# Patient Record
Sex: Male | Born: 1950
Health system: Southern US, Community
[De-identification: ages and names within clinical notes are randomized; demographics above are authoritative.]

## PROBLEM LIST (undated history)

## (undated) DIAGNOSIS — M545 Low back pain, unspecified: Secondary | ICD-10-CM

## (undated) DIAGNOSIS — E785 Hyperlipidemia, unspecified: Secondary | ICD-10-CM

## (undated) DIAGNOSIS — K219 Gastro-esophageal reflux disease without esophagitis: Secondary | ICD-10-CM

## (undated) DIAGNOSIS — Z79899 Other long term (current) drug therapy: Secondary | ICD-10-CM

## (undated) HISTORY — DX: Low back pain, unspecified: M54.50

## (undated) HISTORY — DX: Gastro-esophageal reflux disease without esophagitis: K21.9

## (undated) HISTORY — DX: Low back pain: M54.5

## (undated) HISTORY — PX: APPENDECTOMY: SHX54

## (undated) HISTORY — DX: Other long term (current) drug therapy: Z79.899

## (undated) HISTORY — DX: Hyperlipidemia, unspecified: E78.5

---

## 2011-02-24 ENCOUNTER — Encounter: Payer: Self-pay | Admitting: Surgery

## 2011-03-09 ENCOUNTER — Encounter: Payer: Self-pay | Admitting: Surgery

## 2011-04-09 ENCOUNTER — Encounter: Payer: Self-pay | Admitting: Surgery

## 2013-10-27 LAB — PSA

## 2014-11-27 ENCOUNTER — Telehealth: Payer: Self-pay | Admitting: Unknown Physician Specialty

## 2014-11-27 MED ORDER — OMEPRAZOLE 20 MG PO CPDR: 20.0000 mg | DELAYED_RELEASE_CAPSULE | Freq: Every day | ORAL | Status: DC

## 2014-11-27 NOTE — Telephone Encounter (Signed)
Routing to provider. Patient's practice partner number is [phone].

## 2014-11-27 NOTE — Telephone Encounter (Signed)
E-Fax came through for refill: Rx: Omeprazole Copy of Rx in basket

## 2015-02-07 DIAGNOSIS — M545 Low back pain, unspecified: Secondary | ICD-10-CM | POA: Insufficient documentation

## 2015-02-07 DIAGNOSIS — K219 Gastro-esophageal reflux disease without esophagitis: Secondary | ICD-10-CM | POA: Insufficient documentation

## 2015-02-07 DIAGNOSIS — E1169 Type 2 diabetes mellitus with other specified complication: Secondary | ICD-10-CM | POA: Insufficient documentation

## 2015-02-07 DIAGNOSIS — E785 Hyperlipidemia, unspecified: Secondary | ICD-10-CM

## 2015-02-07 DIAGNOSIS — G4700 Insomnia, unspecified: Secondary | ICD-10-CM | POA: Insufficient documentation

## 2015-02-07 DIAGNOSIS — Z79899 Other long term (current) drug therapy: Secondary | ICD-10-CM | POA: Insufficient documentation

## 2015-02-08 ENCOUNTER — Ambulatory Visit (INDEPENDENT_AMBULATORY_CARE_PROVIDER_SITE_OTHER): Payer: Worker's Compensation | Admitting: Unknown Physician Specialty

## 2015-02-08 ENCOUNTER — Encounter: Payer: Self-pay | Admitting: Unknown Physician Specialty

## 2015-02-08 VITALS — BP 135/75 | HR 67 | Temp 98.5°F | Ht 66.6 in | Wt 193.0 lb

## 2015-02-08 DIAGNOSIS — M545 Low back pain: Secondary | ICD-10-CM

## 2015-02-08 DIAGNOSIS — G4700 Insomnia, unspecified: Secondary | ICD-10-CM | POA: Diagnosis not present

## 2015-02-08 LAB — UA/M W/RFLX CULTURE, ROUTINE
BILIRUBIN UA: NEGATIVE
Glucose, UA: NEGATIVE
KETONES UA: NEGATIVE
Leukocytes, UA: NEGATIVE
Nitrite, UA: NEGATIVE
Protein, UA: NEGATIVE
RBC, UA: NEGATIVE
SPEC GRAV UA: 1.025 (ref 1.005–1.030)
UUROB: 0.2 mg/dL (ref 0.2–1.0)
pH, UA: 6.5 (ref 5.0–7.5)

## 2015-02-08 MED ORDER — TRAMADOL HCL 50 MG PO TABS: 50.0000 mg | ORAL_TABLET | Freq: Two times a day (BID) | ORAL | Status: DC | PRN

## 2015-02-08 NOTE — Assessment & Plan Note (Signed)
Restart Tramadol 1/2 BID with Tylenol

## 2015-02-08 NOTE — Assessment & Plan Note (Signed)
Restart Amitriptyline

## 2015-02-08 NOTE — Progress Notes (Signed)
   BP 135/75 mmHg  Pulse 67  Temp(Src) 98.5 F (36.9 C)  Ht 5' 6.6" (1.692 m)  Wt 193 lb (87.544 kg)  BMI 30.58 kg/m2  SpO2 95%   Subjective:    Patient ID: Dale Morton, male    DOB: Feb 05, 1951, 64 y.o.   MRN: [id number]  HPI: Dale Morton is a 64 y.o. male   Chronic pain Pt states is insurance won't pay for Ultracet.  [facility] states they won't pay for that anymore.  Not taking Ultram.  States that he only took the Amitriptylline once or twice even though last night he couldn't sleep.  No urine for a period of time and needs to be rechecked.    Hypercholesterol Noted high cholesterol about 1 year ago.  Will recheck in again.     Relevant past medical, surgical, family and social history reviewed and updated as indicated. Interim medical history since our last visit reviewed. Allergies and medications reviewed and updated.  Review of Systems  Per HPI unless specifically indicated above     Objective:    BP 135/75 mmHg  Pulse 67  Temp(Src) 98.5 F (36.9 C)  Ht 5' 6.6" (1.692 m)  Wt 193 lb (87.544 kg)  BMI 30.58 kg/m2  SpO2 95%  Wt Readings from Last 3 Encounters:  02/08/15 193 lb (87.544 kg)  08/31/14 193 lb (87.544 kg)    Physical Exam  Constitutional: He is oriented to person, place, and time. He appears well-developed and well-nourished. No distress.  HENT:  Head: Normocephalic and atraumatic.  Eyes: Conjunctivae and lids are normal. Right eye exhibits no discharge. Left eye exhibits no discharge. No scleral icterus.  Cardiovascular: Normal rate, regular rhythm and normal heart sounds.   Pulmonary/Chest: Effort normal and breath sounds normal. No respiratory distress.  Abdominal: Normal appearance. He exhibits no distension. There is no splenomegaly or hepatomegaly. There is no tenderness.  Musculoskeletal: Normal range of motion.  Neurological: He is alert and oriented to person, place, and time.  Skin: Skin is intact. No rash noted. No pallor.  Psychiatric: He  has a normal mood and affect. His behavior is normal. Judgment and thought content normal.     Assessment & Plan:   Problem List Items Addressed This Visit      Unprioritized   Low back pain    Restart Tramadol 1/2 BID with Tylenol      Relevant Medications   traMADol (ULTRAM) 50 MG tablet   Other Relevant Orders   Lipid Panel w/o Chol/HDL Ratio   UA/M w/rflx Culture, Routine   Comprehensive metabolic panel   Insomnia - Primary    Restart Amitriptyline          Follow up plan: Return in about 3 months (around 05/10/2015) for physical.

## 2015-02-08 NOTE — Patient Instructions (Signed)
Take Tramadol 50 mg 1/2 everyday with 650 mg Tylenol BID Take Amitriptyline  1-2 at night for both pain and sleep

## 2015-02-09 LAB — COMPREHENSIVE METABOLIC PANEL
A/G RATIO: 1.7 (ref 1.1–2.5)
ALK PHOS: 119 IU/L — AB (ref 39–117)
ALT: 28 IU/L (ref 0–44)
AST: 29 IU/L (ref 0–40)
Albumin: 4.8 g/dL (ref 3.6–4.8)
BILIRUBIN TOTAL: 0.6 mg/dL (ref 0.0–1.2)
BUN/Creatinine Ratio: 14 (ref 10–22)
BUN: 11 mg/dL (ref 8–27)
CHLORIDE: 99 mmol/L (ref 97–108)
CO2: 26 mmol/L (ref 18–29)
Calcium: 9.4 mg/dL (ref 8.6–10.2)
Creatinine, Ser: 0.76 mg/dL (ref 0.76–1.27)
GFR calc Af Amer: 113 mL/min/{1.73_m2} (ref >59)
GFR calc non Af Amer: 98 mL/min/{1.73_m2} (ref >59)
GLOBULIN, TOTAL: 2.8 g/dL (ref 1.5–4.5)
Glucose: 91 mg/dL (ref 65–99)
Potassium: 4 mmol/L (ref 3.5–5.2)
SODIUM: 141 mmol/L (ref 134–144)
Total Protein: 7.6 g/dL (ref 6.0–8.5)

## 2015-02-09 LAB — LIPID PANEL W/O CHOL/HDL RATIO
CHOLESTEROL TOTAL: 261 mg/dL — AB (ref 100–199)
HDL: 38 mg/dL — AB (ref >39)
LDL Calculated: 166 mg/dL — ABNORMAL HIGH (ref 0–99)
Triglycerides: 283 mg/dL — ABNORMAL HIGH (ref 0–149)
VLDL CHOLESTEROL CAL: 57 mg/dL — AB (ref 5–40)

## 2015-02-13 ENCOUNTER — Encounter: Payer: Self-pay | Admitting: Unknown Physician Specialty

## 2015-05-10 ENCOUNTER — Encounter: Payer: PRIVATE HEALTH INSURANCE | Admitting: Unknown Physician Specialty

## 2015-05-31 ENCOUNTER — Ambulatory Visit (INDEPENDENT_AMBULATORY_CARE_PROVIDER_SITE_OTHER): Payer: PRIVATE HEALTH INSURANCE | Admitting: Family Medicine

## 2015-05-31 ENCOUNTER — Ambulatory Visit
Admission: RE | Admit: 2015-05-31 | Discharge: 2015-05-31 | Disposition: A | Discharge status: Home / Self Care | Payer: Self-pay | Source: Ambulatory Visit | Attending: Family Medicine | Admitting: Family Medicine

## 2015-05-31 ENCOUNTER — Encounter: Payer: Self-pay | Admitting: Family Medicine

## 2015-05-31 VITALS — BP 124/78 | HR 68 | Temp 97.9°F | Ht 66.3 in | Wt 196.0 lb

## 2015-05-31 DIAGNOSIS — M545 Low back pain: Secondary | ICD-10-CM

## 2015-05-31 DIAGNOSIS — R748 Abnormal levels of other serum enzymes: Secondary | ICD-10-CM | POA: Diagnosis not present

## 2015-05-31 LAB — URINALYSIS, ROUTINE W REFLEX MICROSCOPIC
BILIRUBIN UA: NEGATIVE
Ketones, UA: NEGATIVE
Leukocytes, UA: NEGATIVE
Nitrite, UA: NEGATIVE
Protein, UA: NEGATIVE
RBC, UA: NEGATIVE
Specific Gravity, UA: 1.025 (ref 1.005–1.030)
UUROB: 0.2 mg/dL (ref 0.2–1.0)
pH, UA: 5.5 (ref 5.0–7.5)

## 2015-05-31 MED ORDER — MELOXICAM 15 MG PO TABS: 15.0000 mg | ORAL_TABLET | Freq: Every day | ORAL | Status: DC

## 2015-05-31 NOTE — Assessment & Plan Note (Signed)
Discuss low back pain care and treatment proper lifting technique We'll get x-ray of back Use meloxicam

## 2015-05-31 NOTE — Assessment & Plan Note (Signed)
Elevated alkaline phosphatase last labs will check again to see if any change especially with chronic back pain.

## 2015-05-31 NOTE — Progress Notes (Signed)
BP 124/78 mmHg  Pulse 68  Temp(Src) 97.9 F (36.6 C)  Ht 5' 6.3" (1.684 m)  Wt 196 lb (88.905 kg)  BMI 31.35 kg/m2  SpO2 96%   Subjective:    Patient ID: DENALI BECVAR, male    DOB: Nov 29, 1950, 64 y.o.   MRN: [id number]  HPI: SAMIER JACO is a 64 y.o. male  Chief Complaint  Patient presents with  . Back Pain    low back up to neck   patient accompanied by interpreter  patient with intermittent chronic back pain is been ongoing long-term but worse over the last 3 weeks No blood in stool or urine patient does say lifting job with [employer] No radicular symptoms more in his right sacroiliac joint area Relevant past medical, surgical, family and social history reviewed and updated as indicated. Interim medical history since our last visit reviewed. Allergies and medications reviewed and updated.  Review of Systems  Constitutional: Negative.   Respiratory: Negative.   Cardiovascular: Negative.   Gastrointestinal: Negative.   Genitourinary: Negative.   Musculoskeletal: Negative for myalgias, joint swelling, arthralgias and gait problem.    Per HPI unless specifically indicated above     Objective:    BP 124/78 mmHg  Pulse 68  Temp(Src) 97.9 F (36.6 C)  Ht 5' 6.3" (1.684 m)  Wt 196 lb (88.905 kg)  BMI 31.35 kg/m2  SpO2 96%  Wt Readings from Last 3 Encounters:  05/31/15 196 lb (88.905 kg)  02/08/15 193 lb (87.544 kg)  08/31/14 193 lb (87.544 kg)    Physical Exam  Constitutional: He is oriented to person, place, and time. He appears well-developed and well-nourished. No distress.  HENT:  Head: Normocephalic and atraumatic.  Right Ear: Hearing normal.  Left Ear: Hearing normal.  Nose: Nose normal.  Eyes: Conjunctivae and lids are normal. Right eye exhibits no discharge. Left eye exhibits no discharge. No scleral icterus.  Cardiovascular: Normal rate, regular rhythm and normal heart sounds.   Pulmonary/Chest: Effort normal and breath sounds normal. No  respiratory distress.  Musculoskeletal: Normal range of motion.  Straight leg raising normal reflexes normal no neurological deficit good strength and range of motion  Neurological: He is alert and oriented to person, place, and time.  Skin: Skin is intact. No rash noted.  Psychiatric: He has a normal mood and affect. His speech is normal and behavior is normal. Judgment and thought content normal. Cognition and memory are normal.    Results for orders placed or performed in visit on 02/08/15  Lipid Panel w/o Chol/HDL Ratio  Result Value Ref Range   Cholesterol, Total 261 (H) 100 - 199 mg/dL   Triglycerides 409 (H) 0 - 149 mg/dL   HDL 38 (L) >81 mg/dL   VLDL Cholesterol Cal 57 (H) 5 - 40 mg/dL   LDL Calculated 191 (H) 0 - 99 mg/dL  UA/M w/rflx Culture, Routine  Result Value Ref Range   Specific Gravity, UA 1.025 1.005 - 1.030   pH, UA 6.5 5.0 - 7.5   Color, UA Yellow Yellow   Appearance Ur Clear Clear   Leukocytes, UA Negative Negative   Protein, UA Negative Negative/Trace   Glucose, UA Negative Negative   Ketones, UA Negative Negative   RBC, UA Negative Negative   Bilirubin, UA Negative Negative   Urobilinogen, Ur 0.2 0.2 - 1.0 mg/dL   Nitrite, UA Negative Negative  Comprehensive metabolic panel  Result Value Ref Range   Glucose 91 65 - 99 mg/dL  BUN 11 8 - 27 mg/dL   Creatinine, Ser 4.090.76 0.76 - 1.27 mg/dL   GFR calc non Af Amer 98 >59 mL/min/1.73   GFR calc Af Amer 113 >59 mL/min/1.73   BUN/Creatinine Ratio 14 10 - 22   Sodium 141 134 - 144 mmol/L   Potassium 4.0 3.5 - 5.2 mmol/L   Chloride 99 97 - 108 mmol/L   CO2 26 18 - 29 mmol/L   Calcium 9.4 8.6 - 10.2 mg/dL   Total Protein 7.6 6.0 - 8.5 g/dL   Albumin 4.8 3.6 - 4.8 g/dL   Globulin, Total 2.8 1.5 - 4.5 g/dL   Albumin/Globulin Ratio 1.7 1.1 - 2.5   Bilirubin Total 0.6 0.0 - 1.2 mg/dL   Alkaline Phosphatase 119 (H) 39 - 117 IU/L   AST 29 0 - 40 IU/L   ALT 28 0 - 44 IU/L      Assessment & Plan:   Problem  List Items Addressed This Visit      Other   Low back pain - Primary    Discuss low back pain care and treatment proper lifting technique We'll get x-ray of back Use meloxicam      Relevant Medications   meloxicam (MOBIC) 15 MG tablet   Other Relevant Orders   Urinalysis, Routine w reflex microscopic (not at [facility])   DG Lumbar Spine 2-3 Views   Elevated liver enzymes    Elevated alkaline phosphatase last labs will check again to see if any change especially with chronic back pain.      Relevant Orders   Comprehensive metabolic panel       Follow up plan: Return if symptoms worsen or fail to improve, for Physical Exam.

## 2015-06-01 LAB — COMPREHENSIVE METABOLIC PANEL
A/G RATIO: 1.7 (ref 1.1–2.5)
ALK PHOS: 132 IU/L — AB (ref 39–117)
ALT: 31 IU/L (ref 0–44)
AST: 31 IU/L (ref 0–40)
Albumin: 4.6 g/dL (ref 3.6–4.8)
BUN / CREAT RATIO: 21 (ref 10–22)
BUN: 16 mg/dL (ref 8–27)
Bilirubin Total: 0.6 mg/dL (ref 0.0–1.2)
CO2: 21 mmol/L (ref 18–29)
Calcium: 9.5 mg/dL (ref 8.6–10.2)
Chloride: 97 mmol/L (ref 96–106)
Creatinine, Ser: 0.77 mg/dL (ref 0.76–1.27)
GFR calc Af Amer: 112 mL/min/{1.73_m2} (ref >59)
GFR calc non Af Amer: 97 mL/min/{1.73_m2} (ref >59)
GLOBULIN, TOTAL: 2.7 g/dL (ref 1.5–4.5)
Glucose: 192 mg/dL — ABNORMAL HIGH (ref 65–99)
Potassium: 3.9 mmol/L (ref 3.5–5.2)
SODIUM: 136 mmol/L (ref 134–144)
Total Protein: 7.3 g/dL (ref 6.0–8.5)

## 2015-06-04 ENCOUNTER — Telehealth: Payer: Self-pay | Admitting: Family Medicine

## 2015-06-04 DIAGNOSIS — R739 Hyperglycemia, unspecified: Secondary | ICD-10-CM | POA: Insufficient documentation

## 2015-06-04 NOTE — Telephone Encounter (Signed)
Please have him come in for A1c as his sugar was very elevated (does not speak English per notes)

## 2015-06-04 NOTE — Telephone Encounter (Signed)
Patient coming in 12/28 @ 9:00am for A1c

## 2015-06-05 ENCOUNTER — Other Ambulatory Visit: Payer: PRIVATE HEALTH INSURANCE

## 2015-06-05 DIAGNOSIS — R739 Hyperglycemia, unspecified: Secondary | ICD-10-CM

## 2015-06-06 LAB — HGB A1C W/O EAG: Hgb A1c MFr Bld: 6.4 % — ABNORMAL HIGH (ref 4.8–5.6)

## 2015-06-21 ENCOUNTER — Encounter: Payer: Self-pay | Admitting: Unknown Physician Specialty

## 2015-06-21 ENCOUNTER — Ambulatory Visit (INDEPENDENT_AMBULATORY_CARE_PROVIDER_SITE_OTHER): Payer: PRIVATE HEALTH INSURANCE | Admitting: Unknown Physician Specialty

## 2015-06-21 VITALS — BP 126/73 | HR 73 | Temp 98.8°F | Ht 65.6 in | Wt 196.8 lb

## 2015-06-21 DIAGNOSIS — E785 Hyperlipidemia, unspecified: Secondary | ICD-10-CM | POA: Diagnosis not present

## 2015-06-21 DIAGNOSIS — E119 Type 2 diabetes mellitus without complications: Secondary | ICD-10-CM | POA: Diagnosis not present

## 2015-06-21 MED ORDER — ATORVASTATIN CALCIUM 20 MG PO TABS: 20.0000 mg | ORAL_TABLET | Freq: Every day | ORAL | Status: DC

## 2015-06-21 NOTE — Assessment & Plan Note (Signed)
Discussed diet and exercise.  Written information given in spanish.  No medications at this time.

## 2015-06-21 NOTE — Patient Instructions (Addendum)
Diabetes y actividad fsica (Diabetes and Exercise) Hacer actividad fsica con regularidad es muy importante. No se trata solo de perder peso. Tiene muchos otros beneficios, como por ejemplo:  Mejorar el estado fsico, la flexibilidad y la resistencia.  Aumenta la densidad sea.  Ayuda a controlar el peso.  Disminuye la grasa corporal.  Aumenta la fuerza muscular.  Reduce el estrs y las tensiones.  Mejora el estado de salud general. Las personas diabticas que realizan actividad fsica tienen beneficios adicionales debido al ejercicio:  Reduce el apetito.  El organismo mejora el uso del azcar (glucosa) de la sangre.  Ayuda a disminuir o controlar la glucosa en la sangre.  Disminuye la presin arterial.  Ayuda a disminuir los lpidos en la sangre (colesterol y triglicridos).  El organismo mejora el uso de la insulina porque:  Aumenta la sensibilidad del organismo a la insulina.  Reduce las necesidades de insulina del organismo.  Disminuye el riesgo de enfermedad cardaca por la actividad fsica ya que  disminuye el colesterol y tambin los triglicridos.  Aumenta los niveles de colesterol bueno (como las lipoprotenas de alta densidad [HDL]) en el organismo.  Disminuye los niveles de glucosa en la sangre. SU PLAN DE ACTIVIDAD  Elija una actividad que disfrute y establezca objetivos realistas. Para ejercitarse sin riesgos, debe comenzar a practicar cualquier actividad fsica nueva lentamente y aumentar la intensidad del ejercicio de forma gradual con el tiempo. Su mdico o educador en diabetes podrn ayudarlo a crear un plan de actividades que lo beneficie. Las recomendaciones generales incluyen lo siguiente:  Alentar a los nios para que realicen al menos 60 minutos de actividad fsica cada da.  Estirarse y realizar ejercicios de entrenamiento de la fuerza, como yoga o levantamiento de pesas, por lo menos 2 veces por semana.  Realizar en total por lo menos 150  minutos de ejercicios de intensidad moderada cada semana, como caminar a paso ligero o hacer gimnasia acutica.  Hacer ejercicio fsico por lo menos 3 das por semana y no dejar pasar ms de 2 das seguidos sin ejercitarse.  Evitar los perodos largos de inactividad (90 minutos o ms tiempo). Cuando deba pasar mucho tiempo sentado, haga pausas frecuentes para caminar o estirarse. RECOMENDACIONES PARA REALIZAR EJERCICIOS CUANDO SE TIENE DIABETES TIPO 1 O TIPO 2   Controle la glucosa en la sangre antes de comenzar. Si el nivel de glucosa en la sangre es de ms de 240 mg/dl, controle las cetonas en la orina. No haga actividad fsica si hay cetonas.  Evite inyectarse insulina en las zonas del cuerpo que ejercitar. Por ejemplo, evite inyectarse insulina en:  Los brazos, si juega al tenis.  Las piernas, si corre.  Lleve un registro de:  Los alimentos que consume antes y despus de hacer el ejercicio.  Los momentos esperables de picos de accin de la insulina.  Los niveles de glucosa en la sangre antes y despus de hacer ejercicios.  El tipo y cantidad de actividad fsica que realiza.  Revise los registros con su mdico. El mdico lo ayudar a desarrollar pautas para ajustar la cantidad de alimento y las cantidades de insulina antes y despus de hacer ejercicios.  Si toma insulina o agentes hipoglucemiantes por va oral, observe si hay signos y sntomas de hipoglucemia. Entre los que se incluyen:  Mareos.  Temblores.  Sudoracin.  Escalofros.  Confusin.  Beba gran cantidad de agua mientras hace ejercicios para evitar la deshidratacin o los golpes de calor. Durante la actividad fsica se pierde   agua corporal que se debe reponer.  Comente con su mdico antes de comenzar un programa de actividad fsica para verificar que sea seguro para usted. Recuerde, cualquier actividad es mejor que ninguna.   Esta informacin no tiene como fin reemplazar el consejo del mdico. Asegrese de  hacerle al mdico cualquier pregunta que tenga.   Document Released: 06/14/2007 Document Revised: 10/09/2014 Elsevier Interactive Patient Education 2016 Elsevier Inc. La diabetes mellitus y los alimentos (Diabetes Mellitus and Food) Es importante que controle su nivel de azcar en la sangre (glucosa). El nivel de glucosa en sangre depende en gran medida de lo que usted come. Comer alimentos saludables en las cantidades adecuadas a lo largo del da, aproximadamente a la misma hora todos los das, lo ayudar a controlar su nivel de glucosa en sangre. Tambin puede ayudarlo a retrasar o evitar el empeoramiento de la diabetes mellitus. Comer de manera saludable incluso puede ayudarlo a mejorar el nivel de presin arterial y a alcanzar o mantener un peso saludable.  Entre las recomendaciones generales para alimentarse y cocinar los alimentos de forma saludable, se incluyen las siguientes:  Respetar las comidas principales y comer colaciones con regularidad. Evitar pasar largos perodos sin comer con el fin de perder peso.  Seguir una dieta que consista principalmente en alimentos de origen vegetal, como frutas, vegetales, frutos secos, legumbres y cereales integrales.  Utilizar mtodos de coccin a baja temperatura, como hornear, en lugar de mtodos de coccin a alta temperatura, como frer en abundante aceite. Trabaje con el nutricionista para aprender a usar la informacin nutricional de las etiquetas de los alimentos. CMO PUEDEN AFECTARME LOS ALIMENTOS? Carbohidratos Los carbohidratos afectan el nivel de glucosa en sangre ms que cualquier otro tipo de alimento. El nutricionista lo ayudar a determinar cuntos carbohidratos puede consumir en cada comida y ensearle a contarlos. El recuento de carbohidratos es importante para mantener la glucosa en sangre en un nivel saludable, en especial si utiliza insulina o toma determinados medicamentos para la diabetes mellitus. Alcohol El alcohol puede  provocar disminuciones sbitas de la glucosa en sangre (hipoglucemia), en especial si utiliza insulina o toma determinados medicamentos para la diabetes mellitus. La hipoglucemia es una afeccin que puede poner en peligro la vida. Los sntomas de la hipoglucemia (somnolencia, mareos y desorientacin) son similares a los sntomas de haber consumido mucho alcohol.  Si el mdico lo autoriza a beber alcohol, hgalo con moderacin y siga estas pautas:  Las mujeres no deben beber ms de un trago por da, y los hombres no deben beber ms de dos tragos por da. Un trago es igual a:  12 onzas (355 ml) de cerveza  5 onzas de vino (150 ml) de vino  1,5onzas (45ml) de bebidas espirituosas  No beba con el estmago vaco.  Mantngase hidratado. Beba agua, gaseosas dietticas o t helado sin azcar.  Las gaseosas comunes, los jugos y otros refrescos podran contener muchos carbohidratos y se deben contar. QU ALIMENTOS NO SE RECOMIENDAN? Cuando haga las elecciones de alimentos, es importante que recuerde que todos los alimentos son distintos. Algunos tienen menos nutrientes que otros por porcin, aunque podran tener la misma cantidad de caloras o carbohidratos. Es difcil darle al cuerpo lo que necesita cuando consume alimentos con menos nutrientes. Estos son algunos ejemplos de alimentos que debera evitar ya que contienen muchas caloras y carbohidratos, pero pocos nutrientes:  Grasas trans (la mayora de los alimentos procesados incluyen grasas trans en la etiqueta de Informacin nutricional).  Gaseosas comunes.  Jugos.    Caramelos.  Dulces, como tortas, pasteles, rosquillas y galletas.  Comidas fritas. QU ALIMENTOS PUEDO COMER? Consuma alimentos ricos en nutrientes, que nutrirn el cuerpo y lo mantendrn saludable. Los alimentos que debe comer tambin dependern de varios factores, como:  Las caloras que necesita.  Los medicamentos que toma.  Su peso.  El nivel de glucosa en  sangre.  El nivel de presin arterial.  El nivel de colesterol. Debe consumir una amplia variedad de alimentos, por ejemplo:  Protenas.  Cortes de carne magros.  Protenas con bajo contenido de grasas saturadas, como pescado, clara de huevo y frijoles. Evite las carnes procesadas.  Frutas y vegetales.  Frutas y vegetales que pueden ayudar a controlar los niveles sanguneos de glucosa, como manzanas, mangos y batatas.  Productos lcteos.  Elija productos lcteos sin grasa o con bajo contenido de grasa, como leche, yogur y queso.  Cereales, panes, pastas y arroz.  Elija cereales integrales, como panes multicereales, avena en grano y arroz integral. Estos alimentos pueden ayudar a controlar la presin arterial.  Grasas.  Alimentos que contengan grasas saludables, como frutos secos, aguacate, aceite de oliva, aceite de canola y pescado. TODOS LOS QUE PADECEN DIABETES MELLITUS TIENEN EL MISMO PLAN DE COMIDAS? Dado que todas las personas que padecen diabetes mellitus son distintas, no hay un solo plan de comidas que funcione para todos. Es muy importante que se rena con un nutricionista que lo ayudar a crear un plan de comidas adecuado para usted.   Esta informacin no tiene como fin reemplazar el consejo del mdico. Asegrese de hacerle al mdico cualquier pregunta que tenga.   Document Released: 09/01/2007 Document Revised: 06/15/2014 Elsevier Interactive Patient Education 2016 Elsevier Inc.  

## 2015-06-21 NOTE — Progress Notes (Signed)
   BP 126/73 mmHg  Pulse 73  Temp(Src) 98.8 F (37.1 C)  Ht 5' 5.6" (1.666 m)  Wt 196 lb 12.8 oz (89.268 kg)  BMI 32.16 kg/m2  SpO2 95%   Subjective:    Patient ID: Dale Morton, male    DOB: 1951-04-30, 65 y.o.   MRN: [id number]  HPI: Dale HopeJose E Stalvey is a 65 y.o. male  Chief Complaint  Patient presents with  . Hyperlipidemia  . Gastroesophageal Reflux   Pt is here with an interpreter to discuss elevated blood sugar and hgb A1C of 6.4 along with high cholesterol.  Apparently another physician recommended Advicor which was too expensive and stopped taking.    Relevant past medical, surgical, family and social history reviewed and updated as indicated. Interim medical history since our last visit reviewed. Allergies and medications reviewed and updated.  Review of Systems  Per HPI unless specifically indicated above     Objective:    BP 126/73 mmHg  Pulse 73  Temp(Src) 98.8 F (37.1 C)  Ht 5' 5.6" (1.666 m)  Wt 196 lb 12.8 oz (89.268 kg)  BMI 32.16 kg/m2  SpO2 95%  Wt Readings from Last 3 Encounters:  06/21/15 196 lb 12.8 oz (89.268 kg)  05/31/15 196 lb (88.905 kg)  02/08/15 193 lb (87.544 kg)    Physical Exam  Constitutional: He is oriented to person, place, and time. He appears well-developed and well-nourished. No distress.  HENT:  Head: Normocephalic and atraumatic.  Eyes: Conjunctivae and lids are normal. Right eye exhibits no discharge. Left eye exhibits no discharge. No scleral icterus.  Cardiovascular: Normal rate.   Pulmonary/Chest: Effort normal.  Abdominal: Normal appearance. There is no splenomegaly or hepatomegaly.  Musculoskeletal: Normal range of motion.  Neurological: He is alert and oriented to person, place, and time.  Skin: Skin is intact. No rash noted. No pallor.  Psychiatric: He has a normal mood and affect. His behavior is normal. Judgment and thought content normal.    Results for orders placed or performed in visit on 06/05/15  Hgb A1c  w/o eAG  Result Value Ref Range   Hgb A1c MFr Bld 6.4 (H) 4.8 - 5.6 %      Assessment & Plan:   Problem List Items Addressed This Visit      Unprioritized   Hyperlipidemia    Start atorvastatin 20 mg.      Relevant Medications   atorvastatin (LIPITOR) 20 MG tablet   Diabetes (HCC) - Primary    Discussed diet and exercise.  Written information given in spanish.  No medications at this time.        Relevant Medications   atorvastatin (LIPITOR) 20 MG tablet       Follow up plan: Return in about 3 months (around 09/19/2015).   Needs Lipid panel, Hgb A1C, Microalbumin, CMP

## 2015-06-21 NOTE — Assessment & Plan Note (Signed)
Start atorvastatin 20 mg.

## 2015-09-18 ENCOUNTER — Other Ambulatory Visit: Payer: Self-pay | Admitting: Family Medicine

## 2015-09-18 NOTE — Telephone Encounter (Signed)
CW pt

## 2015-09-18 NOTE — Telephone Encounter (Signed)
Apt pe 

## 2015-09-20 ENCOUNTER — Ambulatory Visit: Payer: Self-pay | Admitting: Unknown Physician Specialty

## 2015-12-02 ENCOUNTER — Other Ambulatory Visit: Payer: Self-pay | Admitting: Unknown Physician Specialty

## 2016-01-24 ENCOUNTER — Other Ambulatory Visit: Payer: Self-pay | Admitting: Family Medicine

## 2016-04-10 ENCOUNTER — Encounter: Payer: Self-pay | Admitting: Unknown Physician Specialty

## 2016-04-10 ENCOUNTER — Ambulatory Visit (INDEPENDENT_AMBULATORY_CARE_PROVIDER_SITE_OTHER): Payer: Worker's Compensation | Admitting: Unknown Physician Specialty

## 2016-04-10 VITALS — BP 105/66 | HR 66 | Temp 98.2°F | Ht 66.5 in | Wt 183.4 lb

## 2016-04-10 DIAGNOSIS — M545 Low back pain: Secondary | ICD-10-CM | POA: Diagnosis not present

## 2016-04-10 MED ORDER — CYCLOBENZAPRINE HCL 10 MG PO TABS: 10.0000 mg | ORAL_TABLET | Freq: Three times a day (TID) | ORAL | 0 refills | Status: DC | PRN

## 2016-04-10 NOTE — Progress Notes (Signed)
BP 105/66 (BP Location: Left Arm, Patient Position: Sitting, Cuff Size: Large)   Pulse 66   Temp 98.2 F (36.8 C)   Ht 5' 6.5" (1.689 m) Comment: pt had shoes on  Wt 183 lb 6.4 oz (83.2 kg) Comment: pt had shoes on  SpO2 97%   BMI 29.16 kg/m    Subjective:    Patient ID: Dale Morton, male    DOB: [birth year], 65 y.o.   MRN: [id number]  HPI: Dale Morton is a 65 y.o. male  Pt without interpreter.  Language line used for assistance.  Pt is lost to f/u for his cholesterol  Back Pain  This is a new problem. Episode onset: 1 week. The problem occurs intermittently. The problem is unchanged. The quality of the pain is described as aching. The pain does not radiate. The pain is moderate. The pain is worse during the day. The symptoms are aggravated by sitting and position. Pertinent negatives include no abdominal pain, bladder incontinence, bowel incontinence, chest pain, dysuria, headaches, paresthesias or weakness. He has tried NSAIDs for the symptoms. The treatment provided mild relief.    Relevant past medical, surgical, family and social history reviewed and updated as indicated. Interim medical history since our last visit reviewed. Allergies and medications reviewed and updated.  Review of Systems  Cardiovascular: Negative for chest pain.  Gastrointestinal: Negative for abdominal pain and bowel incontinence.  Genitourinary: Negative for bladder incontinence and dysuria.  Musculoskeletal: Positive for back pain.  Neurological: Negative for weakness, headaches and paresthesias.    Per HPI unless specifically indicated above     Objective:    BP 105/66 (BP Location: Left Arm, Patient Position: Sitting, Cuff Size: Large)   Pulse 66   Temp 98.2 F (36.8 C)   Ht 5' 6.5" (1.689 m) Comment: pt had shoes on  Wt 183 lb 6.4 oz (83.2 kg) Comment: pt had shoes on  SpO2 97%   BMI 29.16 kg/m   Wt Readings from Last 3 Encounters:  04/10/16 183 lb 6.4 oz (83.2 kg)  06/21/15 196  lb 12.8 oz (89.3 kg)  05/31/15 196 lb (88.9 kg)    Physical Exam  Constitutional: He is oriented to person, place, and time. He appears well-developed and well-nourished. No distress.  HENT:  Head: Normocephalic and atraumatic.  Eyes: Conjunctivae and lids are normal. Right eye exhibits no discharge. Left eye exhibits no discharge. No scleral icterus.  Neck: Normal range of motion. Neck supple. No JVD present. Carotid bruit is not present.  Cardiovascular: Normal rate, regular rhythm and normal heart sounds.   Pulmonary/Chest: Effort normal and breath sounds normal. No respiratory distress.  Abdominal: Normal appearance. There is no splenomegaly or hepatomegaly.  Musculoskeletal: Normal range of motion.  Neurological: He is alert and oriented to person, place, and time.  Skin: Skin is warm, dry and intact. No rash noted. No pallor.  Psychiatric: He has a normal mood and affect. His behavior is normal. Judgment and thought content normal.    Results for orders placed or performed in visit on 06/05/15  Hgb A1c w/o eAG  Result Value Ref Range   Hgb A1c MFr Bld 6.4 (H) 4.8 - 5.6 %      Assessment & Plan:   Problem List Items Addressed This Visit      Unprioritized   Lumbago - Primary   Relevant Medications   cyclobenzaprine (FLEXERIL) 10 MG tablet    Other Visit Diagnoses   None.  Follow up plan: Return for needs routine follow up appointment.

## 2016-04-10 NOTE — Patient Instructions (Addendum)
Ejercicios para la espalda (Back Exercises) Los siguientes ejercicios fortalecen los msculos que dan soporte a la espalda y, adems, ayudan a mantener la flexibilidad de la zona lumbar. Hacer estos ejercicios puede ser de ayuda para evitar el dolor de espalda o aliviar el dolor actual. Si tiene dolor o molestias en la espalda, intente hacer estos ejercicios 2 o 3veces por da, o como se lo haya indicado el mdico. Cuando el dolor desaparezca, hgalos una vez por da, pero haga ms repeticiones de cada ejercicio. Si no tiene dolor o molestias en la espalda, haga estos ejercicios una vez por da o como se lo haya indicado el mdico. EJERCICIOS Rodilla al pecho Repita estos pasos 3 o 5veces con cada pierna: 1. Acustese boca arriba sobre una cama dura o sobre el suelo con las piernas extendidas. 2. Lleve una rodilla al pecho. La otra pierna debe quedar extendida y en contacto con el suelo. 3. Mantenga la rodilla contra el pecho. Para lograrlo tmese la rodilla o el muslo. 4. Tire de la rodilla hasta sentir una elongacin suave en la parte baja de la espalda. 5. Mantenga la elongacin durante 10 a 30segundos. 6. Suelte y extienda la pierna lentamente. Inclinacin de la pelvis Repita estos pasos 5 o 10veces: 1. Acustese boca arriba sobre una cama dura o sobre el suelo con las piernas extendidas. 2. Flexione las rodillas de modo que apunten al techo y los pies queden apoyados en el suelo. 3. Contraiga los msculos de la parte baja del abdomen para empujar la zona lumbar contra el suelo. Con este movimiento se inclinar la pelvis de modo que el cccix apunte hacia el techo, en lugar de apuntar en direccin a los pies o al suelo. 4. Contraiga suavemente y respire con normalidad mientras mantiene esta posicin durante 5 a 10segundos. El perro y el gato Repita estos pasos hasta que la zona lumbar se vuelva ms flexible: 1. Apoye las palmas de las manos y las rodillas sobre una superficie firme. Las  manos deben estar alineadas con los hombros y las rodillas con las caderas. Puede colocarse almohadillas debajo de las rodillas para estar cmodo. 2. Deje caer la cabeza y baje el cccix en direccin al suelo de modo que la zona lumbar se arquee como el lomo de un gato asustado. 3. Mantenga esta posicin durante 5segundos. 4. Lentamente, levante la cabeza y eleve el cccix de modo que apunte en direccin al techo para que la espalda forme un arco hundido como el lomo de un perro contento. 5. Mantenga esta posicin durante 5segundos. Flexiones de brazos Repita estos pasos 5 o 10veces: 1. Acustese boca abajo en el suelo. 2. Coloque las palmas de las manos cerca de la cabeza, separadas aproximadamente al ancho de los hombros. 3. Con la espalda lo ms relajada posible y las caderas apoyadas en el suelo, extienda lentamente los brazos para levantar la mitad superior del cuerpo y elevar los hombros. No use los msculos de la espalda para elevar la parte superior del torso. Puede cambiar las manos de lugar para estar ms cmodo. 4. Mantenga esta posicin durante 5segundos mientras mantiene la espalda relajada. 5. Lentamente vuelva a la posicin horizontal. Puentes Repita estos pasos 10veces: 1. Acustese boca arriba sobre una superficie firme. 2. Flexione las rodillas de modo que apunten al techo y los pies queden apoyados en el suelo. 3. Contraiga los glteos y despegue las nalgas del suelo hasta que la cintura est casi a la misma altura que las rodillas. Debe   sentir el trabajo muscular en las nalgas y la parte posterior de los muslos. Si no siente el esfuerzo de BorgWarnerestos msculos, aleje los pies 1 o 2pulgadas (2,5 o 5centmetros) de las nalgas. 4. Mantenga esta posicin durante 3 a 5segundos. 5. Baje lentamente las caderas a la posicin inicial y relaje los glteos por completo. Si este ejercicio le resulta muy fcil, intente realizarlo con los brazos cruzados Sunnyslopesobre el  pecho. Abdominales Repita estos pasos 5 o 10veces: 1. Acustese boca arriba sobre una cama dura o sobre el suelo con las piernas extendidas. 2. Flexione las rodillas de modo que apunten al techo y los pies queden apoyados en el suelo. 3. Cruce los World Fuel Services Corporationbrazos sobre el pecho. 4. Baje levemente el mentn en direccin al pecho sin doblar el cuello. 5. Contraiga los msculos del abdomen y con lentitud eleve el torso lo suficiente como para Artistdespegar levemente los omplatos del suelo. No eleve el torso ms alto que eso, porque esto puede sobreexigir a la zona lumbar y no ayuda a Investment banker, operationalfortalecer los msculos abdominales. 6. Regrese lentamente a la posicin inicial. Elevaciones de espalda Repita estos pasos 5 o 10veces: 1. Acustese boca abajo con los brazos a los costados del cuerpo y apoye la frente en el suelo. 2. Contraiga los msculos de las piernas y los glteos. 3. Lentamente despegue el pecho del suelo Sonic Automotivemientras mantiene las caderas bien apoyadas en el suelo. Mantenga la nuca alineada con la curvatura de la espalda. Los ojos deben mirar al suelo. 4. Mantenga esta posicin durante 3 a 5segundos. 5. Regrese lentamente a la posicin inicial. SOLICITE ATENCIN MDICA SI:  El dolor o las molestias en la espalda se vuelven mucho ms intensos cuando hace un ejercicio.  El dolor o las molestias en la espalda no se Copyalivian en el trmino de las 2horas posteriores a Copyhacer los ejercicios. Si tiene alguno de Limited Brandsestos problemas, deje de ARAMARK Corporationhacer los ejercicios de inmediato. No vuelva a hacer los ejercicios a menos que el mdico lo autorice. SOLICITE ATENCIN MDICA DE INMEDIATO SI:  Siente un dolor sbito e intenso en la espalda. Si esto ocurre, deje de ARAMARK Corporationhacer los ejercicios de inmediato. No vuelva a hacer los ejercicios a menos que el mdico lo autorice.   Esta informacin no tiene Theme park managercomo fin reemplazar el consejo del mdico. Asegrese de hacerle al mdico cualquier pregunta que tenga.   Document Released: 05/25/2005  Document Revised: 02/13/2015 Elsevier Interactive Patient Education 2016 Ar[employer]Elsevier Inc.  Dolor de espalda en adultos (Back Pain, Adult) El dolor de espalda es muy frecuente en los adultos.La causa del dolor de espalda es rara vez peligrosa y Chief Technology Officerel dolor a menudo mejora con el [place] De[place]tiempo.Es posible que se desconozca la causa de esta afeccin. Algunas causas comunes son las siguientes:  Distensin de los msculos o ligamentos que sostienen la columna vertebral.  ChiropractorDesgaste (degeneracin) de los discos vertebrales.  Artritis.  Lesiones directas en la espalda. En [place]as personas, el dolor de espalda es recurrente. Como rara vez es peligroso, las personas pueden aprender a Psychologist, clinicalmanejar esta afeccin por s mismas. INSTRUCCIONES PARA EL CUIDADO EN EL HOGAR Controle su dolor de espalda a fin de Public house managerdetectar algn cambio. Las siguientes indicaciones ayudarn a Architectural technologistaliviar cualquier molestia que pueda sentir:  Medical illustratorermanezca activo. Si permanece sentado o de pie en un mismo lugar durante mucho tiempo, se tensiona la espalda. No se siente, conduzca o permanezca de pie en un mismo lugar durante ms de 30 minutos seguidos. Realice caminatas cortas en superficies planas tan pronto  como le sea posible.Trate de caminar un poco ms de Pharmacist, communitytiempo cada da.  Haga ejercicio regularmente como se lo haya indicado el mdico. El ejercicio ayuda a que su espalda se cure ms rpidamente. Tambin ayuda a prevenir futuras lesiones al Kimberly-Clarkmantener los msculos fuertes y flexibles.  No permanezca en la cama.Si hace reposo ms de 1 a 2 das, puede demorar su recuperacin.  Preste atencin a su cuerpo al inclinarse y levantarse. Las posiciones ms cmodas son las que ejercen menos tensin en la espalda en recuperacin. Siempre use tcnicas apropiadas para levantar objetos, como por ejemplo:  Flexionar las rodillas.  Mantener la carga cerca del cuerpo.  No torcerse.  Encuentre una posicin cmoda para dormir. Use un colchn firme y recustese de  costado con las rodillas ligeramente flexionadas. Si se recuesta Fisher Scientificsobre la espalda, coloque una almohada debajo de las rodillas.  Evite sentir ansiedad o estrs.El estrs aumenta la tensin muscular y puede empeorar el dolor de espalda.Es importante reconocer si se siente ansioso o estresado y aprender maneras de controlarlo, por ejemplo haciendo ejercicio.  Tome los medicamentos solamente como se lo haya indicado el mdico. Los medicamentos de venta libre para Engineer, materialsaliviar el dolor y la inflamacin a menudo son los ms eficaces.El mdico puede recetarle relajantes musculares.Estos medicamentos ayudan a Primary school teachercalmar el dolor de modo que pueda reanudar ms rpidamente sus actividades normales y el ejercicio saludable.  Aplique hielo sobre la zona lesionada.  Ponga el hielo en una bolsa plstica.  Coloque una toalla entre la piel y la bolsa de hielo.  Deje el hielo durante 20minutos, 2 a 3veces por da, durante los primeros 2 o 3das. Despus de eso, puede alternar el hielo y el calor para reducir Chief Technology Officerel dolor y los espasmos.  Mantenga un peso saludable. El exceso de peso ejerce presin adicional sobre la espalda y hace que resulte difcil mantener una buena [place]postura. SOLICITE ATENCIN MDICA SI:  Siente un dolor que no se alivia con reposo o medicamentos.  Siente mucho dolor que se extiende a las piernas o los glteos.  El dolor no mejora en una semana.  Siente dolor por la noche.  Pierde peso.  Siente escalofros o fiebre. SOLICITE ATENCIN MDICA DE INMEDIATO SI:   Tiene nuevos problemas para controlar la vejiga o los intestinos.  Siente debilidad o adormecimiento inusuales en los brazos o en las piernas.  Siente nuseas o vmitos.  Siente dolor abdominal.  Siente que va a desmayarse.   Esta informacin no tiene Theme park managercomo fin reemplazar el consejo del mdico. Asegrese de hacerle al mdico cualquier pregunta que tenga.   Document Released: 05/25/2005 Document Revised: 06/15/2014 Elsevier  Interactive Patient Education [employer]2016 Elsevier Inc.

## 2016-05-15 ENCOUNTER — Ambulatory Visit (INDEPENDENT_AMBULATORY_CARE_PROVIDER_SITE_OTHER): Payer: PRIVATE HEALTH INSURANCE | Admitting: Unknown Physician Specialty

## 2016-05-15 ENCOUNTER — Encounter: Payer: Self-pay | Admitting: Unknown Physician Specialty

## 2016-05-15 VITALS — BP 119/77 | HR 73 | Temp 97.9°F | Wt 190.0 lb

## 2016-05-15 DIAGNOSIS — E782 Mixed hyperlipidemia: Secondary | ICD-10-CM

## 2016-05-15 DIAGNOSIS — E119 Type 2 diabetes mellitus without complications: Secondary | ICD-10-CM | POA: Diagnosis not present

## 2016-05-15 DIAGNOSIS — M545 Low back pain: Secondary | ICD-10-CM | POA: Diagnosis not present

## 2016-05-15 DIAGNOSIS — G8929 Other chronic pain: Secondary | ICD-10-CM

## 2016-05-15 LAB — BAYER DCA HB A1C WAIVED: HB A1C (BAYER DCA - WAIVED): 6.3 % (ref <7.0)

## 2016-05-15 MED ORDER — ATORVASTATIN CALCIUM 20 MG PO TABS: 20.0000 mg | ORAL_TABLET | Freq: Every day | ORAL | 3 refills | Status: DC

## 2016-05-15 MED ORDER — OMEPRAZOLE 20 MG PO CPDR: 20.0000 mg | DELAYED_RELEASE_CAPSULE | Freq: Every day | ORAL | 11 refills | Status: DC

## 2016-05-15 NOTE — Progress Notes (Signed)
BP 119/77 (BP Location: Left Arm, Patient Position: Sitting, Cuff Size: Large)   Pulse 73   Temp 97.9 F (36.6 C)   Wt 190 lb (86.2 kg)   SpO2 98%   BMI 30.21 kg/m    Subjective:    Patient ID: Dale Morton, male    DOB: 1951-01-06, 65 y.o.   MRN: [id number]  HPI: Dale Morton is a 65 y.o. male  Chief Complaint  Patient presents with  . Diabetes  . Hyperlipidemia   Pt is here without an interpreter and we are using the language line.    Diabetes: Using medications without difficulties No hypoglycemic episodes No hyperglycemic episodes Feet problems: Blood Sugars averaging: eye exam within last year Last Hgb A1C:  Hypertension  Using medications without difficulty Average home BPs   Using medication without problems or lightheadedness No chest pain with exertion or shortness of breath No Edema  Elevated Cholesterol Using medications without problems No Muscle aches    Back pain States back pain is a little better but it is still bothering him.  This is a long term injury in which he has been getting workmen's comp.     Relevant past medical, surgical, family and social history reviewed and updated as indicated. Interim medical history since our last visit reviewed. Allergies and medications reviewed and updated.  Review of Systems  Per HPI unless specifically indicated above     Objective:    BP 119/77 (BP Location: Left Arm, Patient Position: Sitting, Cuff Size: Large)   Pulse 73   Temp 97.9 F (36.6 C)   Wt 190 lb (86.2 kg)   SpO2 98%   BMI 30.21 kg/m   Wt Readings from Last 3 Encounters:  05/15/16 190 lb (86.2 kg)  04/10/16 183 lb 6.4 oz (83.2 kg)  06/21/15 196 lb 12.8 oz (89.3 kg)    Physical Exam  Constitutional: He is oriented to person, place, and time. He appears well-developed and well-nourished. No distress.  HENT:  Head: Normocephalic and atraumatic.  Eyes: Conjunctivae and lids are normal. Right eye exhibits no discharge. Left  eye exhibits no discharge. No scleral icterus.  Neck: Normal range of motion. Neck supple. No JVD present. Carotid bruit is not present.  Cardiovascular: Normal rate, regular rhythm and normal heart sounds.   Pulmonary/Chest: Effort normal and breath sounds normal. No respiratory distress.  Abdominal: Normal appearance. There is no splenomegaly or hepatomegaly.  Musculoskeletal: Normal range of motion.  Neurological: He is alert and oriented to person, place, and time.  Skin: Skin is warm, dry and intact. No rash noted. No pallor.  Psychiatric: He has a normal mood and affect. His behavior is normal. Judgment and thought content normal.    Results for orders placed or performed in visit on 06/05/15  Hgb A1c w/o eAG  Result Value Ref Range   Hgb A1c MFr Bld 6.4 (H) 4.8 - 5.6 %      Assessment & Plan:   Problem List Items Addressed This Visit      Unprioritized   Diabetes (HCC)    Hgb A1Cis 6.3.  This stable      Relevant Medications   atorvastatin (LIPITOR) 20 MG tablet   Other Relevant Orders   Comprehensive metabolic panel   Bayer DCA Hb [id number] Waived   Hyperlipidemia - Primary    Will check Lipid panel      Relevant Medications   atorvastatin (LIPITOR) 20 MG tablet   Other Relevant Orders  Lipid Panel w/o Chol/HDL Ratio   Low back pain    This is a chronic problem.  Discussed with the patient, office manager, and interperter that back pain visits need to be separate.  He is currently taking Mobic.  This problem is stable          Follow up plan: Return for 3 months for back and 6 months for cholesterol.

## 2016-05-15 NOTE — Assessment & Plan Note (Signed)
This is a chronic problem.  Discussed with the patient, office manager, and interperter that back pain visits need to be separate.  He is currently taking Mobic.  This problem is stable

## 2016-05-15 NOTE — Assessment & Plan Note (Signed)
Will check Lipid panel

## 2016-05-15 NOTE — Assessment & Plan Note (Addendum)
Hgb A1Cis 6.3.  This stable

## 2016-05-16 LAB — COMPREHENSIVE METABOLIC PANEL
ALBUMIN: 4.7 g/dL (ref 3.6–4.8)
ALK PHOS: 138 IU/L — AB (ref 39–117)
ALT: 23 IU/L (ref 0–44)
AST: 23 IU/L (ref 0–40)
Albumin/Globulin Ratio: 1.6 (ref 1.2–2.2)
BILIRUBIN TOTAL: 0.4 mg/dL (ref 0.0–1.2)
BUN / CREAT RATIO: 18 (ref 10–24)
BUN: 15 mg/dL (ref 8–27)
CHLORIDE: 100 mmol/L (ref 96–106)
CO2: 26 mmol/L (ref 18–29)
CREATININE: 0.82 mg/dL (ref 0.76–1.27)
Calcium: 9.2 mg/dL (ref 8.6–10.2)
GFR calc Af Amer: 108 mL/min/{1.73_m2} (ref >59)
GFR calc non Af Amer: 93 mL/min/{1.73_m2} (ref >59)
GLUCOSE: 107 mg/dL — AB (ref 65–99)
Globulin, Total: 3 g/dL (ref 1.5–4.5)
Potassium: 4.4 mmol/L (ref 3.5–5.2)
SODIUM: 142 mmol/L (ref 134–144)
Total Protein: 7.7 g/dL (ref 6.0–8.5)

## 2016-05-16 LAB — LIPID PANEL W/O CHOL/HDL RATIO
Cholesterol, Total: 229 mg/dL — ABNORMAL HIGH (ref 100–199)
HDL: 39 mg/dL — AB (ref >39)
LDL Calculated: 135 mg/dL — ABNORMAL HIGH (ref 0–99)
TRIGLYCERIDES: 273 mg/dL — AB (ref 0–149)
VLDL CHOLESTEROL CAL: 55 mg/dL — AB (ref 5–40)

## 2016-08-21 ENCOUNTER — Telehealth: Payer: Self-pay | Admitting: Unknown Physician Specialty

## 2016-08-21 NOTE — Telephone Encounter (Signed)
Misty StanleyLisa with [employer] called regarding this patient who needs some information.  She is treating him for low back pain.  Lisa--[phone]  Thanks

## 2016-08-28 ENCOUNTER — Encounter: Payer: Self-pay | Admitting: Unknown Physician Specialty

## 2016-08-28 ENCOUNTER — Ambulatory Visit (INDEPENDENT_AMBULATORY_CARE_PROVIDER_SITE_OTHER): Payer: Worker's Compensation | Admitting: Unknown Physician Specialty

## 2016-08-28 ENCOUNTER — Other Ambulatory Visit: Payer: Self-pay

## 2016-08-28 DIAGNOSIS — G8929 Other chronic pain: Secondary | ICD-10-CM | POA: Diagnosis not present

## 2016-08-28 DIAGNOSIS — M545 Low back pain: Secondary | ICD-10-CM

## 2016-08-28 DIAGNOSIS — K219 Gastro-esophageal reflux disease without esophagitis: Secondary | ICD-10-CM | POA: Diagnosis not present

## 2016-08-28 MED ORDER — MELOXICAM 15 MG PO TABS: 15.0000 mg | ORAL_TABLET | Freq: Every day | ORAL | 6 refills | Status: DC

## 2016-08-28 MED ORDER — CYCLOBENZAPRINE HCL 10 MG PO TABS: 10.0000 mg | ORAL_TABLET | Freq: Three times a day (TID) | ORAL | 6 refills | Status: DC | PRN

## 2016-08-28 NOTE — Assessment & Plan Note (Signed)
Out of the scope of this workmen's comp visit.  Recommended to increase Omeprazole 20 mg BID

## 2016-08-28 NOTE — Telephone Encounter (Signed)
Opened in error

## 2016-08-28 NOTE — Assessment & Plan Note (Signed)
Stable, continue present medications.   

## 2016-08-28 NOTE — Progress Notes (Signed)
BP 138/73 (BP Location: Left Arm, Patient Position: Sitting, Cuff Size: Large)   Pulse 72   Temp 98.7 F (37.1 C)   Ht 5' 6.8" (1.697 m)   Wt 200 lb (90.7 kg)   SpO2 96%   BMI 31.51 kg/m    Subjective:    Patient ID: Dale Morton, male    DOB: 04/02/1951, 66 y.o.   MRN: [id number]  HPI: Dale Morton is a 66 y.o. male  Chief Complaint  Patient presents with  . Back Pain    3 month f/up  . Gastroesophageal Reflux    pt states the omeprazole is not helping very well    Back pain His back pain is a long-term problem sustain with a work injury and paid by [employer] comp.  This pain is no worse or better.  Sitting for a long period of time makes it hurt.  States doing squats, which he discovered on his own, makes it better.    Relevant past medical, surgical, family and social history reviewed and updated as indicated. Interim medical history since our last visit reviewed. Allergies and medications reviewed and updated.  Review of Systems  Per HPI unless specifically indicated above     Objective:    BP 138/73 (BP Location: Left Arm, Patient Position: Sitting, Cuff Size: Large)   Pulse 72   Temp 98.7 F (37.1 C)   Ht 5' 6.8" (1.697 m)   Wt 200 lb (90.7 kg)   SpO2 96%   BMI 31.51 kg/m   Wt Readings from Last 3 Encounters:  08/28/16 200 lb (90.7 kg)  05/15/16 190 lb (86.2 kg)  04/10/16 183 lb 6.4 oz (83.2 kg)    Physical Exam  Constitutional: He is oriented to person, place, and time. He appears well-developed and well-nourished. No distress.  HENT:  Head: Normocephalic and atraumatic.  Eyes: Conjunctivae and lids are normal. Right eye exhibits no discharge. Left eye exhibits no discharge. No scleral icterus.  Neck: Normal range of motion. Neck supple. No JVD present. Carotid bruit is not present.  Cardiovascular: Normal rate, regular rhythm and normal heart sounds.   Pulmonary/Chest: Effort normal and breath sounds normal. No respiratory distress.  Abdominal:  Normal appearance. There is no splenomegaly or hepatomegaly.  Musculoskeletal: Normal range of motion.  Neurological: He is alert and oriented to person, place, and time.  Skin: Skin is warm, dry and intact. No rash noted. No pallor.  Psychiatric: He has a normal mood and affect. His behavior is normal. Judgment and thought content normal.    Results for orders placed or performed in visit on 05/15/16  Lipid Panel w/o Chol/HDL Ratio  Result Value Ref Range   Cholesterol, Total 229 (H) 100 - 199 mg/dL   Triglycerides 409 (H) 0 - 149 mg/dL   HDL 39 (L) >81 mg/dL   VLDL Cholesterol Cal 55 (H) 5 - 40 mg/dL   LDL Calculated 191 (H) 0 - 99 mg/dL  Comprehensive metabolic panel  Result Value Ref Range   Glucose 107 (H) 65 - 99 mg/dL   BUN 15 8 - 27 mg/dL   Creatinine, Ser 4.78 0.76 - 1.27 mg/dL   GFR calc non Af Amer 93 >59 mL/min/1.73   GFR calc Af Amer 108 >59 mL/min/1.73   BUN/Creatinine Ratio 18 10 - 24   Sodium 142 134 - 144 mmol/L   Potassium 4.4 3.5 - 5.2 mmol/L   Chloride 100 96 - 106 mmol/L   CO2 26 18 -  29 mmol/L   Calcium 9.2 8.6 - 10.2 mg/dL   Total Protein 7.7 6.0 - 8.5 g/dL   Albumin 4.7 3.6 - 4.8 g/dL   Globulin, Total 3.0 1.5 - 4.5 g/dL   Albumin/Globulin Ratio 1.6 1.2 - 2.2   Bilirubin Total 0.4 0.0 - 1.2 mg/dL   Alkaline Phosphatase 138 (H) 39 - 117 IU/L   AST 23 0 - 40 IU/L   ALT 23 0 - 44 IU/L  Bayer DCA Hb A1c Waived  Result Value Ref Range   Bayer DCA Hb A1c Waived 6.3 <7.0 %      Assessment & Plan:   Problem List Items Addressed This Visit      Unprioritized   GERD (gastroesophageal reflux disease)    Out of the scope of this workmen's comp visit.  Recommended to increase Omeprazole 20 mg BID      Lumbago    Stable, continue present medications.        Relevant Medications   meloxicam (MOBIC) 15 MG tablet   cyclobenzaprine (FLEXERIL) 10 MG tablet       Follow up plan: Return in about 3 months (around 11/28/2016) for chronic disease  visit.

## 2016-11-13 ENCOUNTER — Ambulatory Visit (INDEPENDENT_AMBULATORY_CARE_PROVIDER_SITE_OTHER): Payer: PRIVATE HEALTH INSURANCE | Admitting: Unknown Physician Specialty

## 2016-11-13 ENCOUNTER — Ambulatory Visit: Payer: Self-pay | Admitting: Unknown Physician Specialty

## 2016-11-13 ENCOUNTER — Encounter: Payer: Self-pay | Admitting: Unknown Physician Specialty

## 2016-11-13 VITALS — BP 135/82 | HR 73 | Temp 98.3°F | Ht 66.0 in | Wt 193.2 lb

## 2016-11-13 DIAGNOSIS — K219 Gastro-esophageal reflux disease without esophagitis: Secondary | ICD-10-CM

## 2016-11-13 DIAGNOSIS — Z0000 Encounter for general adult medical examination without abnormal findings: Secondary | ICD-10-CM | POA: Diagnosis not present

## 2016-11-13 DIAGNOSIS — E782 Mixed hyperlipidemia: Secondary | ICD-10-CM | POA: Diagnosis not present

## 2016-11-13 NOTE — Assessment & Plan Note (Addendum)
Check lipid panel  

## 2016-11-13 NOTE — Progress Notes (Signed)
BP 135/82   Pulse 73   Temp 98.3 F (36.8 C)   Ht 5\' 6"  (1.676 m)   Wt 193 lb 3.2 oz (87.6 kg)   SpO2 97%   BMI 31.18 kg/m    Subjective:    Patient ID: Dale Morton, male    DOB: 1951-03-29, 66 y.o.   MRN: [id number]  HPI: Dale Morton is a 66 y.o. male  Chief Complaint  Patient presents with  . Annual Exam    pt states he is interested in Hep C and HIV orders   . Hyperlipidemia   GERD Having some pain RUQ that is resolved when he takes his reflux medication.    Hyperlipidemia Using medications without problems: No Muscle aches  Diet compliance: Exercise:  Watches what he eats.  Walks 1.5 hours/day.  Does a set of exercises that helps his back and legs.    Social History   Social History  . Marital status: Married    Spouse name: N/A  . Number of children: N/A  . Years of education: N/A   Occupational History  . Not on file.   Social History Main Topics  . Smoking status: Former Smoker    Quit date: 05/30/1985  . Smokeless tobacco: Never Used  . Alcohol use No  . Drug use: No  . Sexual activity: Yes   Other Topics Concern  . Not on file   Social History Narrative  . No narrative on file   Family History  Problem Relation Age of Onset  . Hypertension Father   . Cancer Sister        breast   Past Medical History:  Diagnosis Date  . Encounter for long-term (current) use of other high-risk medications   . GERD (gastroesophageal reflux disease)   . Hyperlipidemia   . Lumbago    Past Surgical History:  Procedure Laterality Date  . APPENDECTOMY     Relevant past medical, surgical, family and social history reviewed and updated as indicated. Interim medical history since our last visit reviewed. Allergies and medications reviewed and updated.  Review of Systems  Per HPI unless specifically indicated above     Objective:    BP 135/82   Pulse 73   Temp 98.3 F (36.8 C)   Ht 5\' 6"  (1.676 m)   Wt 193 lb 3.2 oz (87.6 kg)   SpO2 97%    BMI 31.18 kg/m   Wt Readings from Last 3 Encounters:  11/13/16 193 lb 3.2 oz (87.6 kg)  08/28/16 200 lb (90.7 kg)  05/15/16 190 lb (86.2 kg)    Physical Exam  Results for orders placed or performed in visit on 05/15/16  Lipid Panel w/o Chol/HDL Ratio  Result Value Ref Range   Cholesterol, Total 229 (H) 100 - 199 mg/dL   Triglycerides 130 (H) 0 - 149 mg/dL   HDL 39 (L) >86 mg/dL   VLDL Cholesterol Cal 55 (H) 5 - 40 mg/dL   LDL Calculated 578 (H) 0 - 99 mg/dL  Comprehensive metabolic panel  Result Value Ref Range   Glucose 107 (H) 65 - 99 mg/dL   BUN 15 8 - 27 mg/dL   Creatinine, Ser 4.69 0.76 - 1.27 mg/dL   GFR calc non Af Amer 93 >59 mL/min/1.73   GFR calc Af Amer 108 >59 mL/min/1.73   BUN/Creatinine Ratio 18 10 - 24   Sodium 142 134 - 144 mmol/L   Potassium 4.4 3.5 - 5.2 mmol/L  Chloride 100 96 - 106 mmol/L   CO2 26 18 - 29 mmol/L   Calcium 9.2 8.6 - 10.2 mg/dL   Total Protein 7.7 6.0 - 8.5 g/dL   Albumin 4.7 3.6 - 4.8 g/dL   Globulin, Total 3.0 1.5 - 4.5 g/dL   Albumin/Globulin Ratio 1.6 1.2 - 2.2   Bilirubin Total 0.4 0.0 - 1.2 mg/dL   Alkaline Phosphatase 138 (H) 39 - 117 IU/L   AST 23 0 - 40 IU/L   ALT 23 0 - 44 IU/L  Bayer DCA Hb A1c Waived  Result Value Ref Range   Bayer DCA Hb A1c Waived 6.3 <7.0 %      Assessment & Plan:   Problem List Items Addressed This Visit      Unprioritized   GERD (gastroesophageal reflux disease)    Stable, continue present medications.        Hyperlipidemia    Check lipid panel        Other Visit Diagnoses    Routine general medical examination at a health care facility    -  Primary   Relevant Orders   CBC with Differential/Platelet   Comprehensive metabolic panel   Lipid Panel w/o Chol/HDL Ratio   PSA   TSH   Hepatitis C antibody   HIV antibody       Follow up plan: Return in about 3 months (around 02/13/2017) for back.

## 2016-11-13 NOTE — Assessment & Plan Note (Signed)
improved

## 2016-11-13 NOTE — Assessment & Plan Note (Signed)
Stable, continue present medications.   

## 2016-11-14 LAB — CBC WITH DIFFERENTIAL/PLATELET
BASOS: 1 %
Basophils Absolute: 0.1 10*3/uL (ref 0.0–0.2)
EOS (ABSOLUTE): 0.9 10*3/uL — ABNORMAL HIGH (ref 0.0–0.4)
EOS: 14 %
HEMATOCRIT: 44.1 % (ref 37.5–51.0)
HEMOGLOBIN: 15.1 g/dL (ref 13.0–17.7)
IMMATURE GRANULOCYTES: 0 %
Immature Grans (Abs): 0 10*3/uL (ref 0.0–0.1)
LYMPHS ABS: 2.1 10*3/uL (ref 0.7–3.1)
Lymphs: 35 %
MCH: 30.4 pg (ref 26.6–33.0)
MCHC: 34.2 g/dL (ref 31.5–35.7)
MCV: 89 fL (ref 79–97)
MONOCYTES: 6 %
Monocytes Absolute: 0.4 10*3/uL (ref 0.1–0.9)
Neutrophils Absolute: 2.6 10*3/uL (ref 1.4–7.0)
Neutrophils: 44 %
Platelets: 221 10*3/uL (ref 150–379)
RBC: 4.97 x10E6/uL (ref 4.14–5.80)
RDW: 13.5 % (ref 12.3–15.4)
WBC: 6 10*3/uL (ref 3.4–10.8)

## 2016-11-14 LAB — LIPID PANEL W/O CHOL/HDL RATIO
Cholesterol, Total: 239 mg/dL — ABNORMAL HIGH (ref 100–199)
HDL: 41 mg/dL (ref >39)
LDL CALC: 153 mg/dL — AB (ref 0–99)
Triglycerides: 224 mg/dL — ABNORMAL HIGH (ref 0–149)
VLDL Cholesterol Cal: 45 mg/dL — ABNORMAL HIGH (ref 5–40)

## 2016-11-14 LAB — COMPREHENSIVE METABOLIC PANEL
ALBUMIN: 4.9 g/dL — AB (ref 3.6–4.8)
ALT: 29 IU/L (ref 0–44)
AST: 30 IU/L (ref 0–40)
Albumin/Globulin Ratio: 1.6 (ref 1.2–2.2)
Alkaline Phosphatase: 115 IU/L (ref 39–117)
BUN / CREAT RATIO: 13 (ref 10–24)
BUN: 12 mg/dL (ref 8–27)
Bilirubin Total: 0.7 mg/dL (ref 0.0–1.2)
CALCIUM: 9.6 mg/dL (ref 8.6–10.2)
CO2: 22 mmol/L (ref 18–29)
CREATININE: 0.89 mg/dL (ref 0.76–1.27)
Chloride: 102 mmol/L (ref 96–106)
GFR calc Af Amer: 104 mL/min/{1.73_m2} (ref >59)
GFR, EST NON AFRICAN AMERICAN: 90 mL/min/{1.73_m2} (ref >59)
GLOBULIN, TOTAL: 3 g/dL (ref 1.5–4.5)
Glucose: 97 mg/dL (ref 65–99)
Potassium: 4.2 mmol/L (ref 3.5–5.2)
SODIUM: 142 mmol/L (ref 134–144)
Total Protein: 7.9 g/dL (ref 6.0–8.5)

## 2016-11-14 LAB — HEPATITIS C ANTIBODY

## 2016-11-14 LAB — HIV ANTIBODY (ROUTINE TESTING W REFLEX): HIV SCREEN 4TH GENERATION: NONREACTIVE

## 2016-11-14 LAB — PSA: PROSTATE SPECIFIC AG, SERUM: 0.7 ng/mL (ref 0.0–4.0)

## 2016-11-14 LAB — TSH: TSH: 2.41 u[IU]/mL (ref 0.450–4.500)

## 2016-12-01 ENCOUNTER — Encounter: Payer: Self-pay | Admitting: Unknown Physician Specialty

## 2016-12-10 ENCOUNTER — Other Ambulatory Visit: Payer: Self-pay | Admitting: Family Medicine

## 2016-12-10 ENCOUNTER — Other Ambulatory Visit: Payer: Self-pay | Admitting: Unknown Physician Specialty

## 2017-01-04 IMAGING — CR DG LUMBAR SPINE 2-3V
1 series · 3 of 3 positions shown · non-contrast
Comparison: None in PACs

CLINICAL DATA: Chronic low back pain radiating into the buttocks
without known trauma

EXAM:
LUMBAR SPINE - 2-3 VIEW

[Series 1: ap · 0.17mm/px · 3 of 3 slices shown]
[im 1/3]
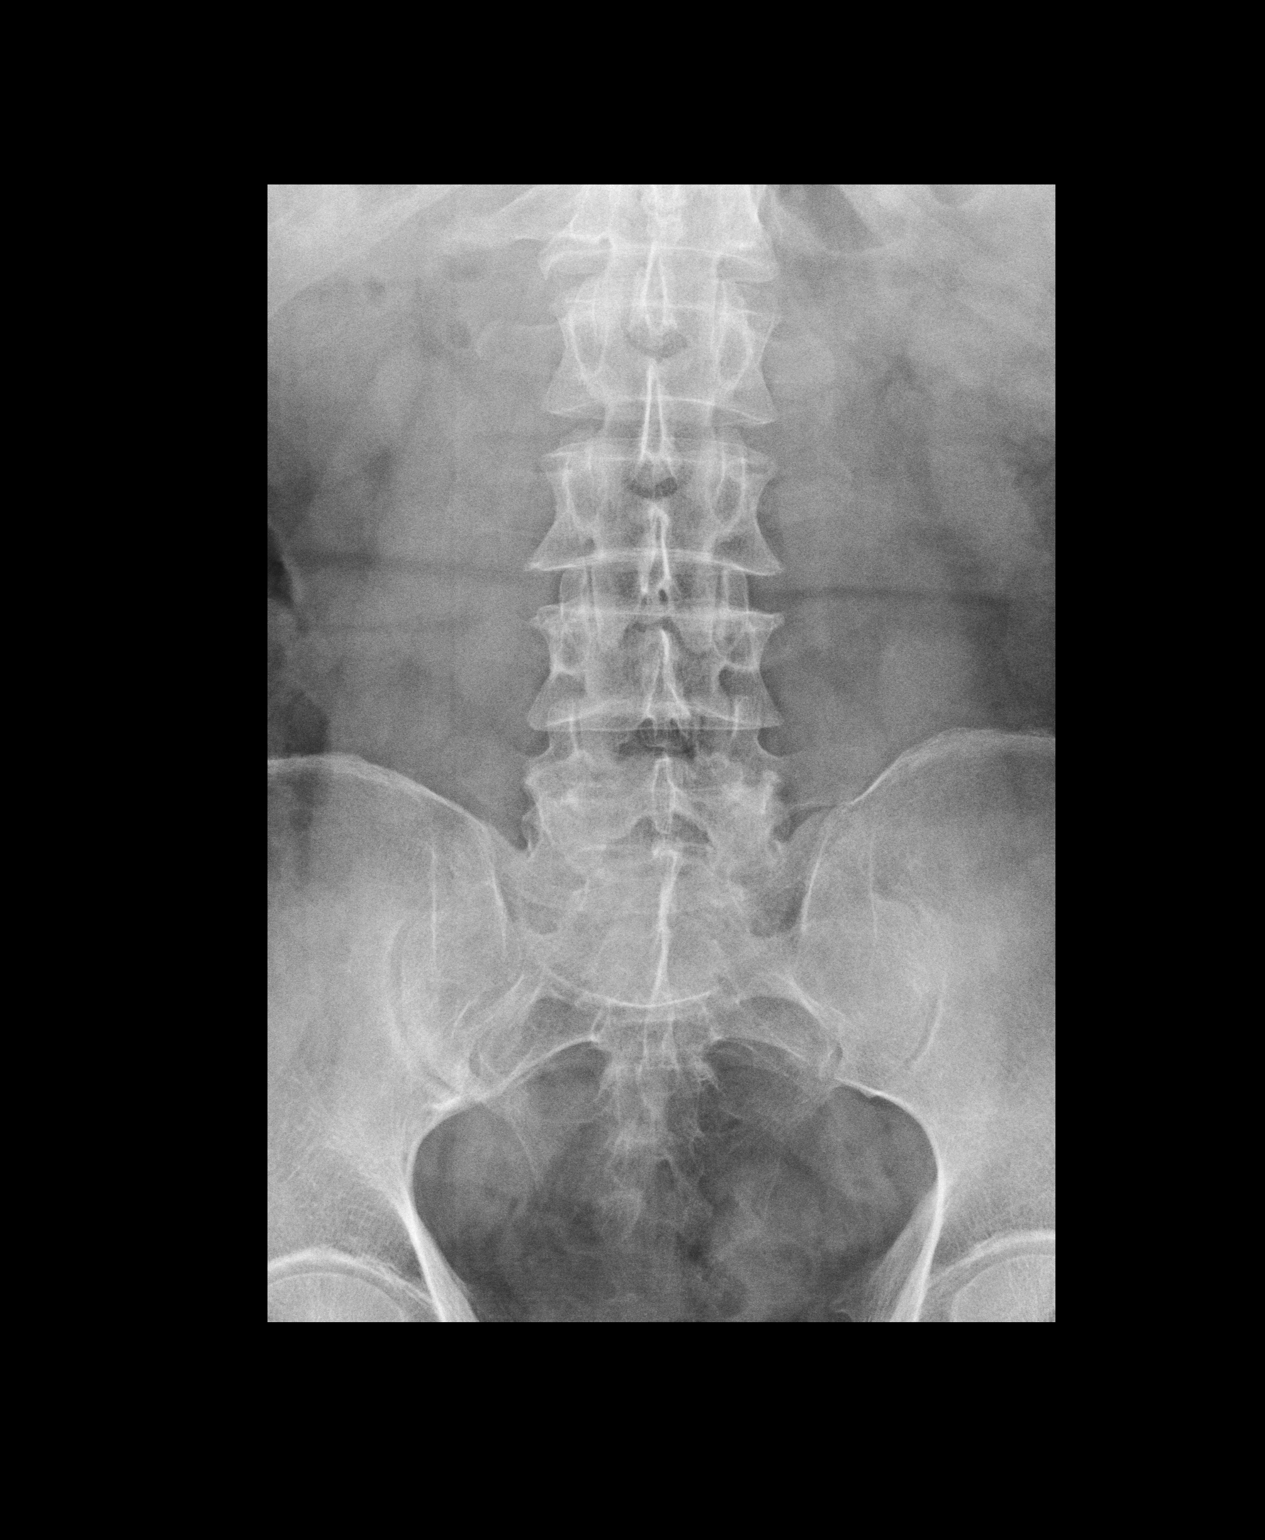
[im 2/3]
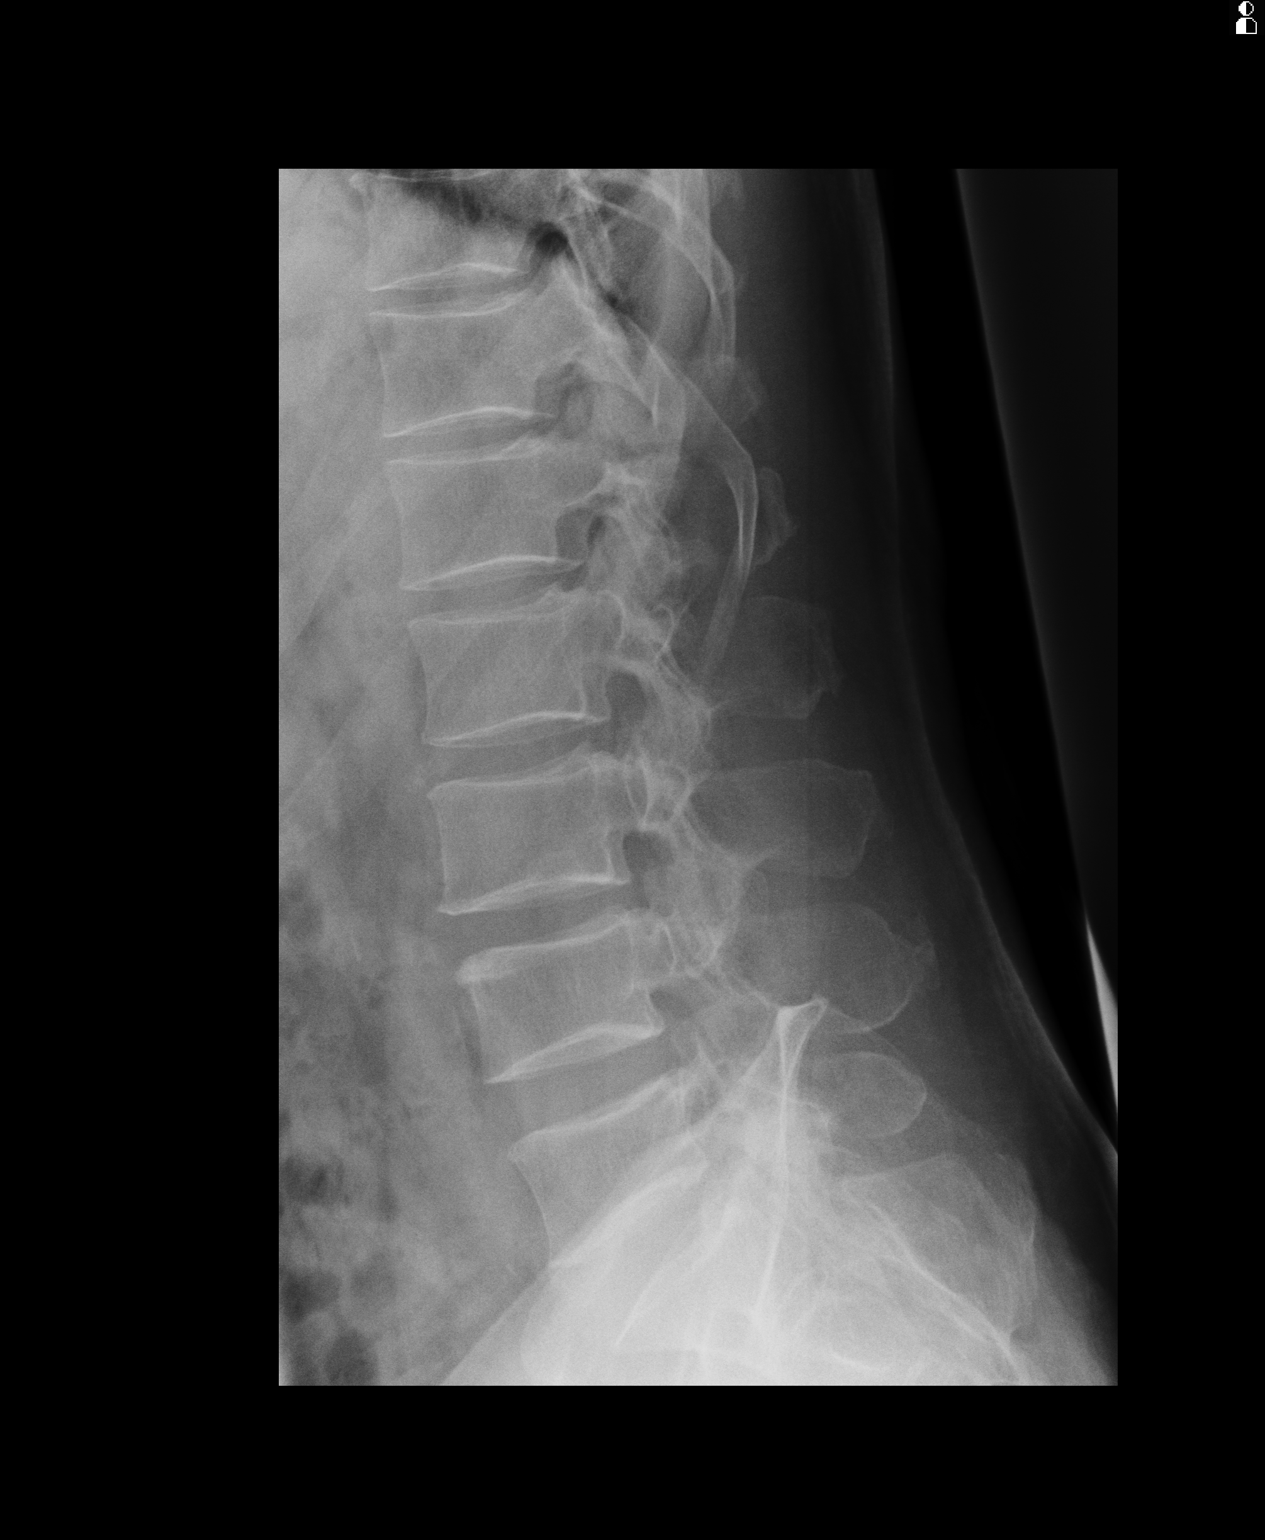
[im 3/3]
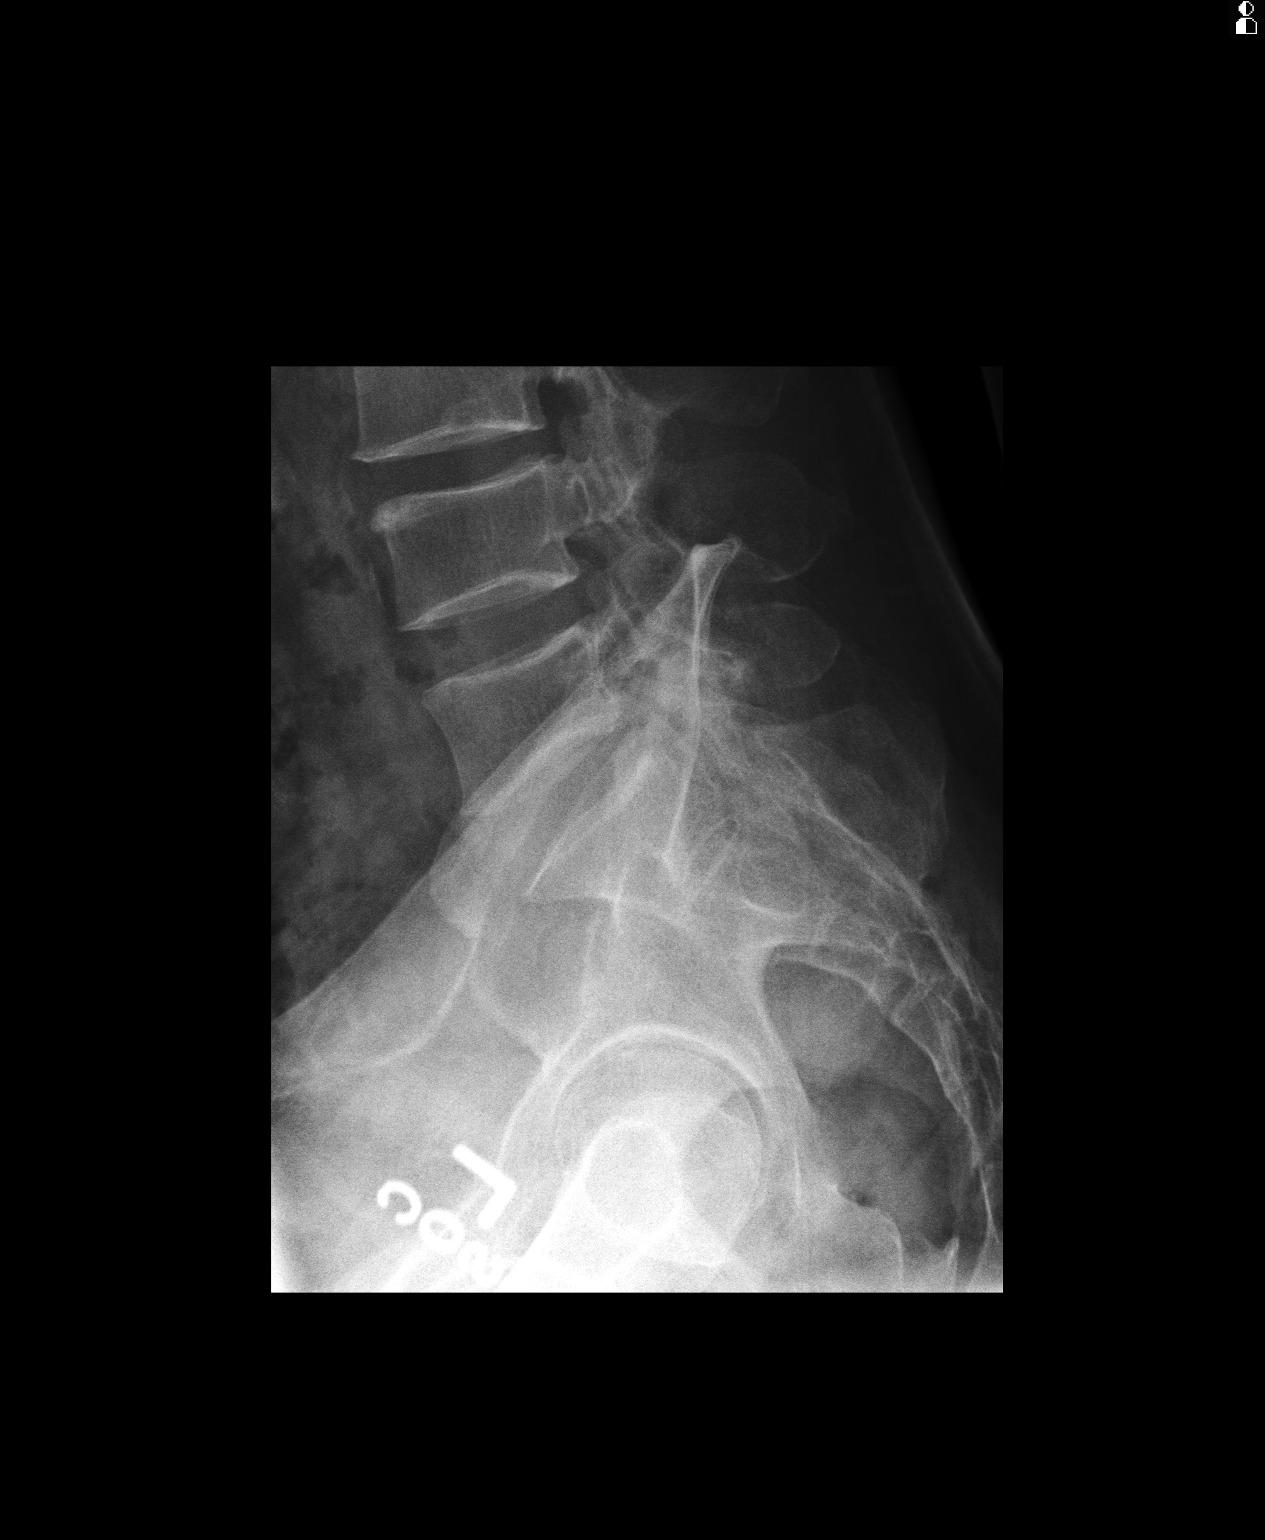

[3 of 3 positions shown; findings below may reference images not displayed]

FINDINGS: The lumbar vertebral bodies are preserved in height. The pedicles
and transverse processes are intact. The L1 pedicles are
transitional. There is facet joint hypertrophy at L5-S1. There is no
spondylolisthesis. The disc space heights are well maintained.
IMPRESSION: There is no acute bony abnormality of the lumbar spine. There is
mild degenerative facet joint change at L5-S1.

## 2017-02-26 ENCOUNTER — Ambulatory Visit: Payer: PRIVATE HEALTH INSURANCE | Admitting: Unknown Physician Specialty

## 2017-03-12 ENCOUNTER — Encounter: Payer: Self-pay | Admitting: Unknown Physician Specialty

## 2017-03-12 ENCOUNTER — Ambulatory Visit (INDEPENDENT_AMBULATORY_CARE_PROVIDER_SITE_OTHER): Payer: PRIVATE HEALTH INSURANCE | Admitting: Unknown Physician Specialty

## 2017-03-12 VITALS — BP 134/75 | HR 75 | Temp 98.7°F | Wt 198.2 lb

## 2017-03-12 DIAGNOSIS — M545 Low back pain: Secondary | ICD-10-CM | POA: Diagnosis not present

## 2017-03-12 DIAGNOSIS — G8929 Other chronic pain: Secondary | ICD-10-CM | POA: Diagnosis not present

## 2017-03-12 DIAGNOSIS — Z23 Encounter for immunization: Secondary | ICD-10-CM

## 2017-03-12 NOTE — Progress Notes (Signed)
BP 134/75   Pulse 75   Temp 98.7 F (37.1 C)   Wt 198 lb 3.2 oz (89.9 kg)   SpO2 97%   BMI 31.99 kg/m    Subjective:    Patient ID: Dale Morton, male    DOB: 1950-11-15, 66 y.o.   MRN: [id number]  HPI: Dale Morton is a 66 y.o. male  Chief Complaint  Patient presents with  . Back Pain    3 month f/up- pt states the medication he takes for his back are not helping very much   Pt is here with his interpreter.    Back pain His back pain is a long-term problem sustain with a work injury.  Up until recently, it has been stable.  Pain is better with activity.  Worse with sitting for a while.  He would like his medications adjusted.  Now, he is only taking medicines with pain.  Willing to start PT  Relevant past medical, surgical, family and social history reviewed and updated as indicated. Interim medical history since our last visit reviewed. Allergies and medications reviewed and updated.  Review of Systems  Per HPI unless specifically indicated above     Objective:    BP 134/75   Pulse 75   Temp 98.7 F (37.1 C)   Wt 198 lb 3.2 oz (89.9 kg)   SpO2 97%   BMI 31.99 kg/m   Wt Readings from Last 3 Encounters:  03/12/17 198 lb 3.2 oz (89.9 kg)  11/13/16 193 lb 3.2 oz (87.6 kg)  08/28/16 200 lb (90.7 kg)    Physical Exam  Constitutional: He is oriented to person, place, and time. He appears well-developed and well-nourished. No distress.  HENT:  Head: Normocephalic and atraumatic.  Eyes: Conjunctivae and lids are normal. Right eye exhibits no discharge. Left eye exhibits no discharge. No scleral icterus.  Neck: Normal range of motion. Neck supple. No JVD present. Carotid bruit is not present.  Cardiovascular: Normal rate, regular rhythm and normal heart sounds.   Pulmonary/Chest: Effort normal and breath sounds normal. No respiratory distress.  Abdominal: Normal appearance. There is no splenomegaly or hepatomegaly.  Musculoskeletal: Normal range of motion.    Neurological: He is alert and oriented to person, place, and time.  Skin: Skin is warm, dry and intact. No rash noted. No pallor.  Psychiatric: He has a normal mood and affect. His behavior is normal. Judgment and thought content normal.    Results for orders placed or performed in visit on 11/13/16  CBC with Differential/Platelet  Result Value Ref Range   WBC 6.0 3.4 - 10.8 x10E3/uL   RBC 4.97 4.14 - 5.80 x10E6/uL   Hemoglobin 15.1 13.0 - 17.7 g/dL   Hematocrit 40.9 81.1 - 51.0 %   MCV 89 79 - 97 fL   MCH 30.4 26.6 - 33.0 pg   MCHC 34.2 31.5 - 35.7 g/dL   RDW 91.4 78.2 - 95.6 %   Platelets 221 150 - 379 x10E3/uL   Neutrophils 44 Not Estab. %   Lymphs 35 Not Estab. %   Monocytes 6 Not Estab. %   Eos 14 Not Estab. %   Basos 1 Not Estab. %   Neutrophils Absolute 2.6 1.4 - 7.0 x10E3/uL   Lymphocytes Absolute 2.1 0.7 - 3.1 x10E3/uL   Monocytes Absolute 0.4 0.1 - 0.9 x10E3/uL   EOS (ABSOLUTE) 0.9 (H) 0.0 - 0.4 x10E3/uL   Basophils Absolute 0.1 0.0 - 0.2 x10E3/uL   Immature Granulocytes 0  Not Estab. %   Immature Grans (Abs) 0.0 0.0 - 0.1 x10E3/uL  Comprehensive metabolic panel  Result Value Ref Range   Glucose 97 65 - 99 mg/dL   BUN 12 8 - 27 mg/dL   Creatinine, Ser 1.61 0.76 - 1.27 mg/dL   GFR calc non Af Amer 90 >59 mL/min/1.73   GFR calc Af Amer 104 >59 mL/min/1.73   BUN/Creatinine Ratio 13 10 - 24   Sodium 142 134 - 144 mmol/L   Potassium 4.2 3.5 - 5.2 mmol/L   Chloride 102 96 - 106 mmol/L   CO2 22 18 - 29 mmol/L   Calcium 9.6 8.6 - 10.2 mg/dL   Total Protein 7.9 6.0 - 8.5 g/dL   Albumin 4.9 (H) 3.6 - 4.8 g/dL   Globulin, Total 3.0 1.5 - 4.5 g/dL   Albumin/Globulin Ratio 1.6 1.2 - 2.2   Bilirubin Total 0.7 0.0 - 1.2 mg/dL   Alkaline Phosphatase 115 39 - 117 IU/L   AST 30 0 - 40 IU/L   ALT 29 0 - 44 IU/L  Lipid Panel w/o Chol/HDL Ratio  Result Value Ref Range   Cholesterol, Total 239 (H) 100 - 199 mg/dL   Triglycerides 096 (H) 0 - 149 mg/dL   HDL 41 >04 mg/dL    VLDL Cholesterol Cal 45 (H) 5 - 40 mg/dL   LDL Calculated 540 (H) 0 - 99 mg/dL  PSA  Result Value Ref Range   Prostate Specific Ag, Serum 0.7 0.0 - 4.0 ng/mL  TSH  Result Value Ref Range   TSH 2.410 0.450 - 4.500 uIU/mL  Hepatitis C antibody  Result Value Ref Range   Hep C Virus Ab <0.1 0.0 - 0.9 s/co ratio  HIV antibody  Result Value Ref Range   HIV Screen 4th Generation wRfx Non Reactive Non Reactive      Assessment & Plan:   Problem List Items Addressed This Visit      Unprioritized   Low back pain    Worsening.  Refer to PT that will work with his WC.  Take Meloxicam daily.  Written instructions given.        Relevant Orders   Ambulatory referral to Physical Therapy   Ambulatory referral to Physical Therapy   Lumbago   Relevant Orders   Ambulatory referral to Physical Therapy   Ambulatory referral to Physical Therapy    Other Visit Diagnoses    Need for influenza vaccination    -  Primary   Relevant Orders   Flu vaccine HIGH DOSE PF (Completed)   Need for pneumococcal vaccine       Relevant Orders   Pneumococcal conjugate vaccine 13-valent IM (Completed)       Follow up plan: Return in about 3 months (around 06/12/2017) for for non-back problems.

## 2017-03-12 NOTE — Patient Instructions (Addendum)
Influenza (Flu) Vaccine (Inactivated or Recombinant): What You Need to Know 1. Why get vaccinated? Influenza ("flu") is a contagious disease that spreads around the [place] every year, usually between October and May. Flu is caused by influenza viruses, and is spread mainly by coughing, sneezing, and close contact. Anyone can get flu. Flu strikes suddenly and can last several days. Symptoms vary by age, but can include:  fever/chills  sore throat  muscle aches  fatigue  cough  headache  runny or stuffy nose  Flu can also lead to pneumonia and blood infections, and cause diarrhea and seizures in children. If you have a medical condition, such as heart or lung disease, flu can make it worse. Flu is more dangerous for some people. Infants and young children, people 65 years of age and older, pregnant women, and people with certain health conditions or a weakened immune system are at greatest risk. Each year thousands of people in the [place] die from flu, and many more are hospitalized. Flu vaccine can:  keep you from getting flu,  make flu less severe if you do get it, and  keep you from spreading flu to your family and other people. 2. Inactivated and recombinant flu vaccines A dose of flu vaccine is recommended every flu season. Children 6 months through 8 years of age may need two doses during the same flu season. Everyone else needs only one dose each flu season. Some inactivated flu vaccines contain a very small amount of a mercury-based preservative called thimerosal. Studies have not shown thimerosal in vaccines to be harmful, but flu vaccines that do not contain thimerosal are available. There is no live flu virus in flu shots. They cannot cause the flu. There are many flu viruses, and they are always changing. Each year a new flu vaccine is made to protect against three or four viruses that are likely to cause disease in the upcoming flu season. But even when the  vaccine doesn't exactly match these viruses, it may still provide some protection. Flu vaccine cannot prevent:  flu that is caused by a virus not covered by the vaccine, or  illnesses that look like flu but are not.  It takes about 2 weeks for protection to develop after vaccination, and protection lasts through the flu season. 3. Some people should not get this vaccine Tell the person who is giving you the vaccine:  If you have any severe, life-threatening allergies. If you ever had a life-threatening allergic reaction after a dose of flu vaccine, or have a severe allergy to any part of this vaccine, you may be advised not to get vaccinated. Most, but not all, types of flu vaccine contain a small amount of egg protein.  If you ever had Guillain-Barr Syndrome (also called GBS). Some people with a history of GBS should not get this vaccine. This should be discussed with your doctor.  If you are not feeling well. It is usually okay to get flu vaccine when you have a mild illness, but you might be asked to come back when you feel better.  4. Risks of a vaccine reaction With any medicine, including vaccines, there is a chance of reactions. These are usually mild and go away on their own, but serious reactions are also possible. Most people who get a flu shot do not have any problems with it. Minor problems following a flu shot include:  soreness, redness, or swelling where the shot was given  hoarseness  sore,   red or itchy eyes  cough  fever  aches  headache  itching  fatigue  If these problems occur, they usually begin soon after the shot and last 1 or 2 days. More serious problems following a flu shot can include the following:  There may be a small increased risk of Guillain-Barre Syndrome (GBS) after inactivated flu vaccine. This risk has been estimated at 1 or 2 additional cases per million people vaccinated. This is much lower than the risk of severe complications from  flu, which can be prevented by flu vaccine.  Young children who get the flu shot along with pneumococcal vaccine (PCV13) and/or DTaP vaccine at the same time might be slightly more likely to have a seizure caused by fever. Ask your doctor for more information. Tell your doctor if a child who is getting flu vaccine has ever had a seizure.  Problems that could happen after any injected vaccine:  People sometimes faint after a medical procedure, including vaccination. Sitting or lying down for about 15 minutes can help prevent fainting, and injuries caused by a fall. Tell your doctor if you feel dizzy, or have vision changes or ringing in the ears.  Some people get severe pain in the shoulder and have difficulty moving the arm where a shot was given. This happens very rarely.  Any medication can cause a severe allergic reaction. Such reactions from a vaccine are very rare, estimated at about 1 in a million doses, and would happen within a few minutes to a few hours after the vaccination. As with any medicine, there is a very remote chance of a vaccine causing a serious injury or death. The safety of vaccines is always being monitored. For more information, visit: http://www.aguilar.org/ 5. What if there is a serious reaction? What should I look for? Look for anything that concerns you, such as signs of a severe allergic reaction, very high fever, or unusual behavior. Signs of a severe allergic reaction can include hives, swelling of the face and throat, difficulty breathing, a fast heartbeat, dizziness, and weakness. These would start a few minutes to a few hours after the vaccination. What should I do?  If you think it is a severe allergic reaction or other emergency that can't wait, call 9-1-1 and get the person to the nearest hospital. Otherwise, call your doctor.  Reactions should be reported to the Vaccine Adverse Event Reporting System (VAERS). Your doctor should file this report, or you  can do it yourself through the VAERS web site at www.vaers.SamedayNews.es, or by calling 6094730752. ? VAERS does not give medical advice. 6. The National Vaccine Injury Compensation Program The [employer] Vaccine Injury Compensation Program (VICP) is a federal program that was created to compensate people who may have been injured by certain vaccines. Persons who believe they may have been injured by a vaccine can learn about the program and about filing a claim by calling 458-267-6070 or visiting the [place] website at GoldCloset.com.ee. There is a time limit to file a claim for compensation. 7. How can I learn more?  Ask your healthcare provider. He or she can give you the vaccine package insert or suggest other sources of information.  Call your local or state health department.  Contact the Centers for Disease Control and Prevention (CDC): ? Call (540)164-9661 (1-800-CDC-INFO) or ? Visit CDC's website at https://gibson.com/ Vaccine Information Statement, Inactivated Influenza Vaccine (01/12/2014) This information is not intended to replace advice given to you by your health care provider. Make sure  you discuss any questions you have with your health care provider. Document Released: 03/19/2006 Document Revised: 02/13/2016 Document Reviewed: 02/13/2016 Elsevier Interactive Patient Education  2017 Elsevier Inc. Pneumococcal Conjugate Vaccine (PCV13) What You Need to Know 1. Why get vaccinated? Vaccination can protect both children and adults from pneumococcal disease. Pneumococcal disease is caused by bacteria that can spread from person to person through close contact. It can cause ear infections, and it can also lead to more serious infections of the:  Lungs (pneumonia),  Blood (bacteremia), and  Covering of the brain and spinal cord (meningitis).  Pneumococcal pneumonia is most common among adults. Pneumococcal meningitis can cause deafness and brain damage, and it kills  about 1 child in 10 who get it. Anyone can get pneumococcal disease, but children under 2 years of age and adults 65 years and older, people with certain medical conditions, and cigarette smokers are at the highest risk. Before there was a vaccine, the [place] saw:  more than 700 cases of meningitis,  about 13,000 blood infections,  about 5 million ear infections, and  about 200 deaths  in children under 5 each year from pneumococcal disease. Since vaccine became available, severe pneumococcal disease in these children has fallen by 88%. About 18,000 older adults die of pneumococcal disease each year in the [place]. Treatment of pneumococcal infections with penicillin and other drugs is not as effective as it used to be, because some strains of the disease have become resistant to these drugs. This makes prevention of the disease, through vaccination, even more important. 2. PCV13 vaccine Pneumococcal conjugate vaccine (called PCV13) protects against 13 types of pneumococcal bacteria. PCV13 is routinely given to children at 2, 4, 6, and 12-15 months of age. It is also recommended for children and adults 2 to 64 years of age with certain health conditions, and for all adults 65 years of age and older. Your doctor can give you details. 3. Some people should not get this vaccine Anyone who has ever had a life-threatening allergic reaction to a dose of this vaccine, to an earlier pneumococcal vaccine called PCV7, or to any vaccine containing diphtheria toxoid (for example, DTaP), should not get PCV13. Anyone with a severe allergy to any component of PCV13 should not get the vaccine. Tell your doctor if the person being vaccinated has any severe allergies. If the person scheduled for vaccination is not feeling well, your healthcare provider might decide to reschedule the shot on another day. 4. Risks of a vaccine reaction With any medicine, including vaccines, there is a chance of  reactions. These are usually mild and go away on their own, but serious reactions are also possible. Problems reported following PCV13 varied by age and dose in the series. The most common problems reported among children were:  About half became drowsy after the shot, had a temporary loss of appetite, or had redness or tenderness where the shot was given.  About 1 out of 3 had swelling where the shot was given.  About 1 out of 3 had a mild fever, and about 1 in 20 had a fever over 102.2F.  Up to about 8 out of 10 became fussy or irritable.  Adults have reported pain, redness, and swelling where the shot was given; also mild fever, fatigue, headache, chills, or muscle pain. Young children who get PCV13 along with inactivated flu vaccine at the same time may be at increased risk for seizures caused by fever. Ask your doctor for more   information. Problems that could happen after any vaccine:  People sometimes faint after a medical procedure, including vaccination. Sitting or lying down for about 15 minutes can help prevent fainting, and injuries caused by a fall. Tell your doctor if you feel dizzy, or have vision changes or ringing in the ears.  Some older children and adults get severe pain in the shoulder and have difficulty moving the arm where a shot was given. This happens very rarely.  Any medication can cause a severe allergic reaction. Such reactions from a vaccine are very rare, estimated at about 1 in a million doses, and would happen within a few minutes to a few hours after the vaccination. As with any medicine, there is a very small chance of a vaccine causing a serious injury or death. The safety of vaccines is always being monitored. For more information, visit: http://floyd.org/ 5. What if there is a serious reaction? What should I look for? Look for anything that concerns you, such as signs of a severe allergic reaction, very high fever, or unusual behavior. Signs of  a severe allergic reaction can include hives, swelling of the face and throat, difficulty breathing, a fast heartbeat, dizziness, and weakness-usually within a few minutes to a few hours after the vaccination. What should I do?  If you think it is a severe allergic reaction or other emergency that can't wait, call 9-1-1 or get the person to the nearest hospital. Otherwise, call your doctor.  Reactions should be reported to the Vaccine Adverse Event Reporting System (VAERS). Your doctor should file this report, or you can do it yourself through the VAERS web site at www.vaers.LAgents.no, or by calling 1-(705)461-5888. ? VAERS does not give medical advice. 6. The National Vaccine Injury Compensation Program The [employer] Vaccine Injury Compensation Program (VICP) is a federal program that was created to compensate people who may have been injured by certain vaccines. Persons who believe they may have been injured by a vaccine can learn about the program and about filing a claim by calling 1-(206) 329-7028 or visiting the VICP website at SpiritualWord.at. There is a time limit to file a claim for compensation. 7. How can I learn more?  Ask your healthcare provider. He or she can give you the vaccine package insert or suggest other sources of information.  Call your local or state health department.  Contact the Centers for Disease Control and Prevention (CDC): ? Call 952 222 0206 (1-800-CDC-INFO) or ? Visit CDC's website at PicCapture.uy Vaccine Information Statement, PCV13 Vaccine (04/12/2014) This information is not intended to replace advice given to you by your health care provider. Make sure you discuss any questions you have with your health care provider. ------------------------------------------------------------ Tomense  Meloxicam a todos dias. No solo con [employer]

## 2017-03-12 NOTE — Assessment & Plan Note (Signed)
Worsening.  Refer to PT that will work with his WC.  Take Meloxicam daily.  Written instructions given.

## 2017-04-23 ENCOUNTER — Ambulatory Visit: Payer: No Typology Code available for payment source | Admitting: Physical Therapy

## 2017-05-21 ENCOUNTER — Encounter: Payer: Self-pay | Admitting: Unknown Physician Specialty

## 2017-05-21 ENCOUNTER — Ambulatory Visit: Payer: Self-pay | Admitting: Unknown Physician Specialty

## 2017-05-31 ENCOUNTER — Other Ambulatory Visit: Payer: Self-pay | Admitting: Family Medicine

## 2017-06-18 ENCOUNTER — Ambulatory Visit (INDEPENDENT_AMBULATORY_CARE_PROVIDER_SITE_OTHER): Payer: Worker's Compensation | Admitting: Unknown Physician Specialty

## 2017-06-18 ENCOUNTER — Encounter: Payer: Self-pay | Admitting: Unknown Physician Specialty

## 2017-06-18 DIAGNOSIS — M545 Low back pain: Secondary | ICD-10-CM | POA: Diagnosis not present

## 2017-06-18 DIAGNOSIS — G8929 Other chronic pain: Secondary | ICD-10-CM | POA: Diagnosis not present

## 2017-06-18 DIAGNOSIS — E782 Mixed hyperlipidemia: Secondary | ICD-10-CM

## 2017-06-18 NOTE — Progress Notes (Signed)
   BP (!) 145/75   Pulse 75   Temp 98.1 F (36.7 C) (Oral)   Wt 198 lb 12.8 oz (90.2 kg)   SpO2 97%   BMI 32.09 kg/m    Subjective:    Patient ID: Dale Morton, male    DOB: 1950-08-19, 67 y.o.   MRN: [id number]  HPI: Dale Morton is a 67 y.o. male  Chief Complaint  Patient presents with  . Gastroesophageal Reflux  . Hyperlipidemia  . Back Pain   Pt is here with the assistance of an interpreter.  This visit was initially for his non-back chronic issues, but pt has no health insurance at this time and would like to just talk about his back issues that is through workmen's comp.    Back pain His back pain is a long-term problem sustain with a work injury.  Pain is better with activity.  Worse with sitting for a while.  States he did go to PT but since they did not have the insurance information, they did not see him.  States he is not working and pain is better and doing exercises at home.    Relevant past medical, surgical, family and social history reviewed and updated as indicated. Interim medical history since our last visit reviewed. Allergies and medications reviewed and updated.  Review of Systems  Per HPI unless specifically indicated above     Objective:    BP (!) 145/75   Pulse 75   Temp 98.1 F (36.7 C) (Oral)   Wt 198 lb 12.8 oz (90.2 kg)   SpO2 97%   BMI 32.09 kg/m   Wt Readings from Last 3 Encounters:  06/18/17 198 lb 12.8 oz (90.2 kg)  03/12/17 198 lb 3.2 oz (89.9 kg)  11/13/16 193 lb 3.2 oz (87.6 kg)    Physical Exam  Constitutional: He is oriented to person, place, and time. He appears well-developed and well-nourished. No distress.  HENT:  Head: Normocephalic and atraumatic.  Eyes: Conjunctivae and lids are normal. Right eye exhibits no discharge. Left eye exhibits no discharge. No scleral icterus.  Cardiovascular: Normal rate.  Pulmonary/Chest: Effort normal.  Abdominal: Normal appearance. There is no splenomegaly or hepatomegaly.    Musculoskeletal: Normal range of motion.  Neurological: He is alert and oriented to person, place, and time.  Skin: Skin is intact. No rash noted. No pallor.  Psychiatric: He has a normal mood and affect. His behavior is normal. Judgment and thought content normal.     Assessment & Plan:   Problem List Items Addressed This Visit      Unprioritized   Hyperlipidemia    No insurance for now.  OK to continue present medication      Low back pain    Long term problem with workmen's comp.  There seems to be a problem with filing back treatments under WC outside this office.  Therefore, no treatments through PT. He is better so will not pursue PT at this time but will get insurance information.           Follow up plan: Return in about 6 months (around 12/16/2017).

## 2017-06-18 NOTE — Assessment & Plan Note (Signed)
Long term problem with workmen's comp.  There seems to be a problem with filing back treatments under WC outside this office.  Therefore, no treatments through PT. He is better so will not pursue PT at this time but will get insurance information.

## 2017-06-18 NOTE — Assessment & Plan Note (Signed)
No insurance for now.  OK to continue present medication

## 2017-07-26 ENCOUNTER — Telehealth: Payer: Self-pay | Admitting: Unknown Physician Specialty

## 2017-07-26 DIAGNOSIS — G8929 Other chronic pain: Secondary | ICD-10-CM

## 2017-07-26 DIAGNOSIS — M545 Low back pain: Principal | ICD-10-CM

## 2017-07-26 NOTE — Telephone Encounter (Signed)
Copied from CRM [phone]. Topic: Referral - Status >> Jul 26, 2017  2:01 PM Landry MellowFoltz, Melissa J wrote: Reason for UEA:[id number]:[name] - she is asking for referral to be sent back over to them. The pt came in today asking for services.  Please fax to [phone] or call [phone] >> Jul 26, 2017  4:26 PM Marshall CorkBullock, Keri L, CMA wrote: Dale Maxwellheryl, Please [facility] in [facility] as the other referral has been closed.  Done

## 2017-07-27 NOTE — Telephone Encounter (Signed)
[facility]

## 2017-09-20 ENCOUNTER — Telehealth: Payer: Self-pay | Admitting: Unknown Physician Specialty

## 2017-09-20 MED ORDER — MELOXICAM 15 MG PO TABS: 15.0000 mg | ORAL_TABLET | Freq: Every day | ORAL | 3 refills | Status: DC

## 2017-09-20 MED ORDER — ATORVASTATIN CALCIUM 20 MG PO TABS: 20.0000 mg | ORAL_TABLET | Freq: Every day | ORAL | 1 refills | Status: DC

## 2017-09-20 MED ORDER — CYCLOBENZAPRINE HCL 10 MG PO TABS: 10.0000 mg | ORAL_TABLET | Freq: Three times a day (TID) | ORAL | 6 refills | Status: DC | PRN

## 2017-09-20 MED ORDER — OMEPRAZOLE 20 MG PO CPDR: DELAYED_RELEASE_CAPSULE | ORAL | 6 refills | Status: DC

## 2017-09-20 NOTE — Telephone Encounter (Signed)
Patient needs refills on the following  Atorvastatin  Cyclobenzaprine meloxicam Omeprazole   [facility]  Thanks

## 2017-12-17 ENCOUNTER — Encounter: Payer: Self-pay | Admitting: Unknown Physician Specialty

## 2017-12-17 ENCOUNTER — Ambulatory Visit (INDEPENDENT_AMBULATORY_CARE_PROVIDER_SITE_OTHER): Payer: Worker's Compensation | Admitting: Unknown Physician Specialty

## 2017-12-17 VITALS — BP 135/86 | HR 78 | Temp 98.5°F | Ht 64.75 in | Wt 192.8 lb

## 2017-12-17 DIAGNOSIS — G8929 Other chronic pain: Secondary | ICD-10-CM

## 2017-12-17 DIAGNOSIS — M545 Low back pain: Secondary | ICD-10-CM

## 2017-12-17 MED ORDER — CYCLOBENZAPRINE HCL 10 MG PO TABS: 10.0000 mg | ORAL_TABLET | Freq: Three times a day (TID) | ORAL | 6 refills | Status: DC | PRN

## 2017-12-17 NOTE — Assessment & Plan Note (Signed)
Secondary to WC injury.  States he frequently doesn't take Meloxicam daily, only when he really needs it.

## 2017-12-17 NOTE — Progress Notes (Signed)
BP 135/86 (BP Location: Left Arm, Cuff Size: Normal)   Pulse 78   Temp 98.5 F (36.9 C) (Oral)   Ht 5' 4.75" (1.645 m)   Wt 192 lb 12.8 oz (87.5 kg)   SpO2 96%   BMI 32.33 kg/m    Subjective:    Patient ID: Dale Morton, male    DOB: Mar 18, 1951, 67 y.o.   MRN: [id number]  HPI: Dale Morton is a 67 y.o. male  Chief Complaint  Patient presents with  . Follow-up   Pt is here with an interpreter who is helping with transation  Low back pain Pt is here to f/u on his low back pain.  This is a long standing problem and a result of a work injury.  Pain is better with activity and does exercises before shower.  Pain is no better and no worse since last seen.   I have referred him for PT but due to insurance issues, he was never able to go due to insurance issues which have been confusing.  The medication is helping but frustrated that insurance isn't paying for it.     Relevant past medical, surgical, family and social history reviewed and updated as indicated. Interim medical history since our last visit reviewed. Allergies and medications reviewed and updated.  Review of Systems  Constitutional: Negative.   HENT: Negative.   Eyes: Negative.   Respiratory: Negative.   Cardiovascular: Negative.   Gastrointestinal: Negative.   Endocrine: Negative.   Genitourinary: Negative.   Skin: Negative.   Allergic/Immunologic: Negative.   Neurological: Negative.   Hematological: Negative.   Psychiatric/Behavioral: Negative.     Per HPI unless specifically indicated above     Objective:    BP 135/86 (BP Location: Left Arm, Cuff Size: Normal)   Pulse 78   Temp 98.5 F (36.9 C) (Oral)   Ht 5' 4.75" (1.645 m)   Wt 192 lb 12.8 oz (87.5 kg)   SpO2 96%   BMI 32.33 kg/m   Wt Readings from Last 3 Encounters:  12/17/17 192 lb 12.8 oz (87.5 kg)  06/18/17 198 lb 12.8 oz (90.2 kg)  03/12/17 198 lb 3.2 oz (89.9 kg)    Physical Exam  Constitutional: He is oriented to person, place,  and time. He appears well-developed and well-nourished. No distress.  HENT:  Head: Normocephalic and atraumatic.  Eyes: Conjunctivae and lids are normal. Right eye exhibits no discharge. Left eye exhibits no discharge. No scleral icterus.  Neck: Normal range of motion. Neck supple. No JVD present. Carotid bruit is not present.  Cardiovascular: Normal rate, regular rhythm and normal heart sounds.  Pulmonary/Chest: Effort normal and breath sounds normal. No respiratory distress.  Abdominal: Normal appearance. There is no splenomegaly or hepatomegaly.  Musculoskeletal:       Lumbar back: He exhibits decreased range of motion. He exhibits no tenderness and no bony tenderness.  Neurological: He is alert and oriented to person, place, and time.  Skin: Skin is warm, dry and intact. No rash noted. No pallor.  Psychiatric: He has a normal mood and affect. His behavior is normal. Judgment and thought content normal.    Results for orders placed or performed in visit on 11/13/16  CBC with Differential/Platelet  Result Value Ref Range   WBC 6.0 3.4 - 10.8 x10E3/uL   RBC 4.97 4.14 - 5.80 x10E6/uL   Hemoglobin 15.1 13.0 - 17.7 g/dL   Hematocrit 40.944.1 81.137.5 - 51.0 %   MCV 89 79 -  97 fL   MCH 30.4 26.6 - 33.0 pg   MCHC 34.2 31.5 - 35.7 g/dL   RDW 40.9 81.1 - 91.4 %   Platelets 221 150 - 379 x10E3/uL   Neutrophils 44 Not Estab. %   Lymphs 35 Not Estab. %   Monocytes 6 Not Estab. %   Eos 14 Not Estab. %   Basos 1 Not Estab. %   Neutrophils Absolute 2.6 1.4 - 7.0 x10E3/uL   Lymphocytes Absolute 2.1 0.7 - 3.1 x10E3/uL   Monocytes Absolute 0.4 0.1 - 0.9 x10E3/uL   EOS (ABSOLUTE) 0.9 (H) 0.0 - 0.4 x10E3/uL   Basophils Absolute 0.1 0.0 - 0.2 x10E3/uL   Immature Granulocytes 0 Not Estab. %   Immature Grans (Abs) 0.0 0.0 - 0.1 x10E3/uL  Comprehensive metabolic panel  Result Value Ref Range   Glucose 97 65 - 99 mg/dL   BUN 12 8 - 27 mg/dL   Creatinine, Ser 7.82 0.76 - 1.27 mg/dL   GFR calc non Af  Amer 90 >59 mL/min/1.73   GFR calc Af Amer 104 >59 mL/min/1.73   BUN/Creatinine Ratio 13 10 - 24   Sodium 142 134 - 144 mmol/L   Potassium 4.2 3.5 - 5.2 mmol/L   Chloride 102 96 - 106 mmol/L   CO2 22 18 - 29 mmol/L   Calcium 9.6 8.6 - 10.2 mg/dL   Total Protein 7.9 6.0 - 8.5 g/dL   Albumin 4.9 (H) 3.6 - 4.8 g/dL   Globulin, Total 3.0 1.5 - 4.5 g/dL   Albumin/Globulin Ratio 1.6 1.2 - 2.2   Bilirubin Total 0.7 0.0 - 1.2 mg/dL   Alkaline Phosphatase 115 39 - 117 IU/L   AST 30 0 - 40 IU/L   ALT 29 0 - 44 IU/L  Lipid Panel w/o Chol/HDL Ratio  Result Value Ref Range   Cholesterol, Total 239 (H) 100 - 199 mg/dL   Triglycerides 956 (H) 0 - 149 mg/dL   HDL 41 >21 mg/dL   VLDL Cholesterol Cal 45 (H) 5 - 40 mg/dL   LDL Calculated 308 (H) 0 - 99 mg/dL  PSA  Result Value Ref Range   Prostate Specific Ag, Serum 0.7 0.0 - 4.0 ng/mL  TSH  Result Value Ref Range   TSH 2.410 0.450 - 4.500 uIU/mL  Hepatitis C antibody  Result Value Ref Range   Hep C Virus Ab <0.1 0.0 - 0.9 s/co ratio  HIV antibody  Result Value Ref Range   HIV Screen 4th Generation wRfx Non Reactive Non Reactive      Assessment & Plan:   Problem List Items Addressed This Visit    None      Low back pain Secondary to WC injury.  States he frequently doesn't take Meloxicam daily, only when he really needs it.  We will see if we can get PT to see him  Follow up plan: Return in about 3 months (around 03/19/2018) for Cholesterol and labs.

## 2018-03-23 ENCOUNTER — Ambulatory Visit: Payer: Self-pay | Admitting: Family Medicine

## 2018-04-01 ENCOUNTER — Encounter: Payer: Self-pay | Admitting: Nurse Practitioner

## 2018-04-01 ENCOUNTER — Ambulatory Visit (INDEPENDENT_AMBULATORY_CARE_PROVIDER_SITE_OTHER): Payer: Worker's Compensation | Admitting: Nurse Practitioner

## 2018-04-01 ENCOUNTER — Other Ambulatory Visit: Payer: Self-pay

## 2018-04-01 DIAGNOSIS — G8929 Other chronic pain: Secondary | ICD-10-CM | POA: Diagnosis not present

## 2018-04-01 DIAGNOSIS — M545 Low back pain, unspecified: Secondary | ICD-10-CM

## 2018-04-01 MED ORDER — MELOXICAM 15 MG PO TABS: 15.0000 mg | ORAL_TABLET | Freq: Every day | ORAL | 1 refills | Status: DC

## 2018-04-01 MED ORDER — CYCLOBENZAPRINE HCL 10 MG PO TABS: 10.0000 mg | ORAL_TABLET | Freq: Three times a day (TID) | ORAL | 1 refills | Status: DC | PRN

## 2018-04-01 NOTE — Assessment & Plan Note (Signed)
Chronic, ongoing.  Stable on current medication regimen.  Reeducated him on proper medication routine, as had not be utilizing as ordered.

## 2018-04-01 NOTE — Progress Notes (Signed)
BP 117/78   Pulse 92   Temp 98.4 F (36.9 C) (Oral)   Ht 5\' 6"  (1.676 m)   Wt 197 lb 12.8 oz (89.7 kg)   SpO2 95%   BMI 31.93 kg/m    Subjective:    Patient ID: Dale Morton, male    DOB: [birth year], 67 y.o.   MRN: [id number]  HPI: Dale Morton is a 67 y.o. male  Chief Complaint  Patient presents with  . Back Pain   BACK PAIN Chronic and ongoing since work accident.  Reports that he went to PT, but they would not see him because they did not have compensation paperwork. He has been using Flexeril at home, but only at night because he understood he could only take at night.  Discussed with him that order is three times a day as needed.  He also reports he has not been taking Meloxicam because he did not realize he could take it every day.  Discussed with him that it is ordered for daily use.  States pain is worse when lying down, but when moving he has very little pain.  No radiation or paresthesia.  Is going to [place] and can not start PT until he comes back in March.  Also requesting a three months supply of medications to take with him, sent this request to pharmacy.  Encouraged him to use Icy/Hot patches when lying down and heat/ice.  He reports he has use Icy/Hot patches in past and they did help.  Discussed that he can place these on at night and take off in morning or out on in morning and take off at night.  Discussed yoga stretches that can be used. Duration: months Mechanism of injury: fell from ladder and pushing items at work Location: low back Onset: gradual Severity: 7/10 at worst when lying down, 0/10 when moving Quality: aching and burning Frequency: intermittent Radiation: none Aggravating factors: laying Alleviating factors: heat, NSAIDs and muscle relaxer Status: stable Treatments attempted: heat and ibuprofen  Relief with NSAIDs?: moderate Nighttime pain:  yes, pain always present when lying down, but no pain with standing Paresthesias / decreased  sensation:  no Bowel / bladder incontinence:  no Fevers:  no Dysuria / urinary frequency:  no  Relevant past medical, surgical, family and social history reviewed and updated as indicated. Interim medical history since our last visit reviewed. Allergies and medications reviewed and updated.  Review of Systems  Constitutional: Negative for activity change, diaphoresis, fatigue and fever.  Respiratory: Negative for cough, chest tightness, shortness of breath and wheezing.   Cardiovascular: Negative for chest pain, palpitations and leg swelling.  Gastrointestinal: Negative for abdominal distention, abdominal pain, nausea and vomiting.  Genitourinary: Negative for dysuria, flank pain and urgency.  Musculoskeletal: Positive for back pain. Negative for gait problem, neck pain and neck stiffness.  Skin: Negative.   Allergic/Immunologic: Negative.   Neurological: Negative for dizziness, syncope, weakness, numbness and headaches.  Hematological: Negative.   Psychiatric/Behavioral: Negative for behavioral problems.   Per HPI unless specifically indicated above     Objective:    BP 117/78   Pulse 92   Temp 98.4 F (36.9 C) (Oral)   Ht 5\' 6"  (1.676 m)   Wt 197 lb 12.8 oz (89.7 kg)   SpO2 95%   BMI 31.93 kg/m   Wt Readings from Last 3 Encounters:  04/01/18 197 lb 12.8 oz (89.7 kg)  12/17/17 192 lb 12.8 oz (87.5 kg)  06/18/17 198  lb 12.8 oz (90.2 kg)    Physical Exam  Constitutional: He is oriented to person, place, and time. He appears well-developed and well-nourished.  HENT:  Head: Normocephalic and atraumatic.  Eyes: Pupils are equal, round, and reactive to light. Conjunctivae and EOM are normal.  Neck: Normal range of motion. Neck supple. No JVD present. Carotid bruit is not present. No thyromegaly present.  Cardiovascular: Normal rate, regular rhythm, normal heart sounds and intact distal pulses.  Pulmonary/Chest: Effort normal and breath sounds normal.  Abdominal: Soft. Bowel  sounds are normal. There is no splenomegaly or hepatomegaly.  Musculoskeletal:       Lumbar back: He exhibits decreased range of motion. He exhibits no tenderness, no bony tenderness, no swelling and no edema.  Lymphadenopathy:    He has no cervical adenopathy.  Neurological: He is alert and oriented to person, place, and time.  Skin: Skin is warm and dry.  Psychiatric: He has a normal mood and affect. His behavior is normal.    Results for orders placed or performed in visit on 11/13/16  CBC with Differential/Platelet  Result Value Ref Range   WBC 6.0 3.4 - 10.8 x10E3/uL   RBC 4.97 4.14 - 5.80 x10E6/uL   Hemoglobin 15.1 13.0 - 17.7 g/dL   Hematocrit 40.9 81.1 - 51.0 %   MCV 89 79 - 97 fL   MCH 30.4 26.6 - 33.0 pg   MCHC 34.2 31.5 - 35.7 g/dL   RDW 91.4 78.2 - 95.6 %   Platelets 221 150 - 379 x10E3/uL   Neutrophils 44 Not Estab. %   Lymphs 35 Not Estab. %   Monocytes 6 Not Estab. %   Eos 14 Not Estab. %   Basos 1 Not Estab. %   Neutrophils Absolute 2.6 1.4 - 7.0 x10E3/uL   Lymphocytes Absolute 2.1 0.7 - 3.1 x10E3/uL   Monocytes Absolute 0.4 0.1 - 0.9 x10E3/uL   EOS (ABSOLUTE) 0.9 (H) 0.0 - 0.4 x10E3/uL   Basophils Absolute 0.1 0.0 - 0.2 x10E3/uL   Immature Granulocytes 0 Not Estab. %   Immature Grans (Abs) 0.0 0.0 - 0.1 x10E3/uL  Comprehensive metabolic panel  Result Value Ref Range   Glucose 97 65 - 99 mg/dL   BUN 12 8 - 27 mg/dL   Creatinine, Ser 2.13 0.76 - 1.27 mg/dL   GFR calc non Af Amer 90 >59 mL/min/1.73   GFR calc Af Amer 104 >59 mL/min/1.73   BUN/Creatinine Ratio 13 10 - 24   Sodium 142 134 - 144 mmol/L   Potassium 4.2 3.5 - 5.2 mmol/L   Chloride 102 96 - 106 mmol/L   CO2 22 18 - 29 mmol/L   Calcium 9.6 8.6 - 10.2 mg/dL   Total Protein 7.9 6.0 - 8.5 g/dL   Albumin 4.9 (H) 3.6 - 4.8 g/dL   Globulin, Total 3.0 1.5 - 4.5 g/dL   Albumin/Globulin Ratio 1.6 1.2 - 2.2   Bilirubin Total 0.7 0.0 - 1.2 mg/dL   Alkaline Phosphatase 115 39 - 117 IU/L   AST 30 0 - 40  IU/L   ALT 29 0 - 44 IU/L  Lipid Panel w/o Chol/HDL Ratio  Result Value Ref Range   Cholesterol, Total 239 (H) 100 - 199 mg/dL   Triglycerides 086 (H) 0 - 149 mg/dL   HDL 41 >57 mg/dL   VLDL Cholesterol Cal 45 (H) 5 - 40 mg/dL   LDL Calculated 846 (H) 0 - 99 mg/dL  PSA  Result Value Ref Range  Prostate Specific Ag, Serum 0.7 0.0 - 4.0 ng/mL  TSH  Result Value Ref Range   TSH 2.410 0.450 - 4.500 uIU/mL  Hepatitis C antibody  Result Value Ref Range   Hep C Virus Ab <0.1 0.0 - 0.9 s/co ratio  HIV antibody  Result Value Ref Range   HIV Screen 4th Generation wRfx Non Reactive Non Reactive      Assessment & Plan:   Problem List Items Addressed This Visit      Other   Lumbago    Chronic, ongoing.  Stable on current medication regimen.  Reeducated him on proper medication routine, as had not be utilizing as ordered.        Relevant Medications   cyclobenzaprine (FLEXERIL) 10 MG tablet   meloxicam (MOBIC) 15 MG tablet   Low back pain    Chronic, ongoing secondary to WC injury.  Reports he has not been taking medication as ordered, as he did not know regimen.  Reeducated him on routine.  Encouraged use of chair yoga and showed him pictures of stretching routines online.        Relevant Medications   cyclobenzaprine (FLEXERIL) 10 MG tablet   meloxicam (MOBIC) 15 MG tablet       Follow up plan: Return in about 5 months (around 08/31/2018) for Back pain.

## 2018-04-01 NOTE — Assessment & Plan Note (Signed)
Chronic, ongoing secondary to WC injury.  Reports he has not been taking medication as ordered, as he did not know regimen.  Reeducated him on routine.  Encouraged use of chair yoga and showed him pictures of stretching routines online.

## 2018-04-01 NOTE — Patient Instructions (Signed)

## 2018-09-02 ENCOUNTER — Telehealth: Payer: Self-pay | Admitting: Nurse Practitioner

## 2018-09-02 ENCOUNTER — Ambulatory Visit: Payer: Self-pay | Admitting: Nurse Practitioner

## 2018-09-02 NOTE — Telephone Encounter (Signed)
Yes please.  That is easier.  I have tried doing three way calls with interpreter before and it does not work well.  Thanks.

## 2018-09-02 NOTE — Telephone Encounter (Signed)
Pt had appt for 10:30 today would you like for him to reschedule for Monday to be seen in office so interpreter can be used?

## 2018-09-05 ENCOUNTER — Encounter: Payer: Self-pay | Admitting: Nurse Practitioner

## 2018-09-05 ENCOUNTER — Ambulatory Visit (INDEPENDENT_AMBULATORY_CARE_PROVIDER_SITE_OTHER): Payer: Worker's Compensation | Admitting: Nurse Practitioner

## 2018-09-05 ENCOUNTER — Other Ambulatory Visit: Payer: Self-pay

## 2018-09-05 VITALS — BP 112/71 | HR 75 | Temp 98.1°F | Ht 66.0 in | Wt 196.0 lb

## 2018-09-05 DIAGNOSIS — G8929 Other chronic pain: Secondary | ICD-10-CM

## 2018-09-05 DIAGNOSIS — M545 Low back pain: Secondary | ICD-10-CM

## 2018-09-05 MED ORDER — LIDOCAINE-MENTHOL 4-1 % EX PTCH: 1.0000 | MEDICATED_PATCH | Freq: Every day | CUTANEOUS | 5 refills | Status: DC

## 2018-09-05 MED ORDER — MELOXICAM 15 MG PO TABS: 15.0000 mg | ORAL_TABLET | Freq: Every day | ORAL | 1 refills | Status: DC

## 2018-09-05 MED ORDER — CYCLOBENZAPRINE HCL 10 MG PO TABS: 10.0000 mg | ORAL_TABLET | Freq: Three times a day (TID) | ORAL | 1 refills | Status: DC | PRN

## 2018-09-05 NOTE — Progress Notes (Signed)
BP 112/71 (BP Location: Left Arm, Patient Position: Sitting, Cuff Size: Normal)   Pulse 75   Temp 98.1 F (36.7 C) (Oral)   Ht 5\' 6"  (1.676 m)   Wt 196 lb (88.9 kg)   SpO2 94%   BMI 31.64 kg/m    Subjective:    Patient ID: Dale Morton, male    DOB: 1950-12-15, 68 y.o.   MRN: [id number]  HPI: Dale Morton is a 68 y.o. male  Chief Complaint  Patient presents with  . Back Pain    6 month F/U   BACK PAIN Is a chronic, ongoing issue since work accident in 1988 or 1989.  During accident he was pulling a deck and it got broken, then pulled back during movement + has h/o falling from height during work.  When he initially went to PT they would not see him because he did not have compensation paperwork.  At this time he states he has been on the couch most of the time, as he can not go out much right now with quarantine.  He has spent time in the yard, garage, and inside the house.  Has been using Flexeril and Meloxicam at home.  Has been using Meloxicam "when he has pain", sometimes once a day and sometimes every three days.  Flexeril he uses every four days at this time, reports it makes him sleepy so tries not to use it as much.  Reports if he takes it every day he does not get same effect.  He reports he has labs recently done in [place], he will bring these to next visit. Duration: months Mechanism of injury: work accident Location: low back Onset: chronic Severity: 7/10 at worst and at best 2/10 -- worst when lifts something or when he does not move for long period Quality: dull and burning, mainly on right side Frequency: intermittent Radiation: none Aggravating factors: lifting and prolonged sitting Alleviating factors: NSAIDs and muscle relaxer Status: fluctuating Treatments attempted: Flexeril and ibuprofen  Relief with NSAIDs?: moderate Nighttime pain:  no Paresthesias / decreased sensation:  no Bowel / bladder incontinence:  no Fevers:  no Dysuria / urinary  frequency:  no  Relevant past medical, surgical, family and social history reviewed and updated as indicated. Interim medical history since our last visit reviewed. Allergies and medications reviewed and updated.  Review of Systems  Constitutional: Negative for activity change, diaphoresis, fatigue and fever.  Respiratory: Negative for cough, chest tightness, shortness of breath and wheezing.   Cardiovascular: Negative for chest pain, palpitations and leg swelling.  Gastrointestinal: Negative for abdominal distention, abdominal pain, constipation, diarrhea, nausea and vomiting.  Endocrine: Negative for cold intolerance, heat intolerance, polydipsia, polyphagia and polyuria.  Musculoskeletal: Negative.   Skin: Negative.   Neurological: Negative for dizziness, syncope, weakness, light-headedness, numbness and headaches.  Psychiatric/Behavioral: Negative.     Per HPI unless specifically indicated above     Objective:    BP 112/71 (BP Location: Left Arm, Patient Position: Sitting, Cuff Size: Normal)   Pulse 75   Temp 98.1 F (36.7 C) (Oral)   Ht 5\' 6"  (1.676 m)   Wt 196 lb (88.9 kg)   SpO2 94%   BMI 31.64 kg/m   Wt Readings from Last 3 Encounters:  09/05/18 196 lb (88.9 kg)  04/01/18 197 lb 12.8 oz (89.7 kg)  12/17/17 192 lb 12.8 oz (87.5 kg)    Physical Exam Vitals signs and nursing note reviewed.  Constitutional:  Appearance: He is well-developed.  HENT:     Head: Normocephalic and atraumatic.     Right Ear: Hearing normal. No drainage.     Left Ear: Hearing normal. No drainage.     Mouth/Throat:     Pharynx: Uvula midline.  Eyes:     General: Lids are normal.        Right eye: No discharge.        Left eye: No discharge.     Conjunctiva/sclera: Conjunctivae normal.     Pupils: Pupils are equal, round, and reactive to light.  Neck:     Musculoskeletal: Normal range of motion and neck supple.     Thyroid: No thyromegaly.     Vascular: No carotid bruit or JVD.      Trachea: Trachea normal.  Cardiovascular:     Rate and Rhythm: Normal rate and regular rhythm.     Heart sounds: Normal heart sounds, S1 normal and S2 normal. No murmur. No gallop.   Pulmonary:     Effort: Pulmonary effort is normal.     Breath sounds: Normal breath sounds.  Abdominal:     General: Bowel sounds are normal.     Palpations: Abdomen is soft. There is no hepatomegaly or splenomegaly.  Musculoskeletal:     Lumbar back: He exhibits decreased range of motion, tenderness and pain. He exhibits no swelling, no edema and no laceration.     Right lower leg: No edema.     Left lower leg: No edema.     Comments: Decreased ROM on flexion and extension plus lateral bend to right and lateral rotation to right.  Negative straight leg testing.    Skin:    General: Skin is warm and dry.     Capillary Refill: Capillary refill takes less than 2 seconds.     Findings: No rash.  Neurological:     Mental Status: He is alert and oriented to person, place, and time.     Deep Tendon Reflexes: Reflexes are normal and symmetric.  Psychiatric:        Mood and Affect: Mood normal.        Behavior: Behavior normal.        Thought Content: Thought content normal.        Judgment: Judgment normal.   Back Exam:    Inspection:  Normal spinal curvature.  No deformity, ecchymosis, erythema, or lesions   Curvature: Normal   Deformity: no  Ecchymosis: no none  Erythema:  no none  Lesions: no    Palpation:     Midline spinal tenderness: no none                 Paralumbar tenderness: yes, mild to right with some muscle tension noted     Parathoracic tenderness: no none     Buttocks tenderness: none     Range of Motion:      Flexion: fingers to knee     Extension:Decreased     Lateral bending:Decreased R>L    Rotation:Decreased R>L    Neuro Exam:Lower extremity DTRs normal & symmetric.  Strength and sensation intact.     Patellar DTRs: Normal     Ankle dorsiflexion strength:Within Normal  Limits     Sensation(medial malleolus):Within Normal Limits     Great toe dorsiflexion strength: Within Normal Limits     Sensation (mid dorsal foot):Within Normal Limits     Ankle DTRs: Normal     Ankle plantar flexion strength:Within Normal Limits  Sensation (lateral heel):Within Normal Limits    Special Tests:      Straight leg raise:negative     Crossed straight leg raise:negative     Stork test: negative  Results for orders placed or performed in visit on 11/13/16  CBC with Differential/Platelet  Result Value Ref Range   WBC 6.0 3.4 - 10.8 x10E3/uL   RBC 4.97 4.14 - 5.80 x10E6/uL   Hemoglobin 15.1 13.0 - 17.7 g/dL   Hematocrit 09.8 11.9 - 51.0 %   MCV 89 79 - 97 fL   MCH 30.4 26.6 - 33.0 pg   MCHC 34.2 31.5 - 35.7 g/dL   RDW 14.7 82.9 - 56.2 %   Platelets 221 150 - 379 x10E3/uL   Neutrophils 44 Not Estab. %   Lymphs 35 Not Estab. %   Monocytes 6 Not Estab. %   Eos 14 Not Estab. %   Basos 1 Not Estab. %   Neutrophils Absolute 2.6 1.4 - 7.0 x10E3/uL   Lymphocytes Absolute 2.1 0.7 - 3.1 x10E3/uL   Monocytes Absolute 0.4 0.1 - 0.9 x10E3/uL   EOS (ABSOLUTE) 0.9 (H) 0.0 - 0.4 x10E3/uL   Basophils Absolute 0.1 0.0 - 0.2 x10E3/uL   Immature Granulocytes 0 Not Estab. %   Immature Grans (Abs) 0.0 0.0 - 0.1 x10E3/uL  Comprehensive metabolic panel  Result Value Ref Range   Glucose 97 65 - 99 mg/dL   BUN 12 8 - 27 mg/dL   Creatinine, Ser 1.30 0.76 - 1.27 mg/dL   GFR calc non Af Amer 90 >59 mL/min/1.73   GFR calc Af Amer 104 >59 mL/min/1.73   BUN/Creatinine Ratio 13 10 - 24   Sodium 142 134 - 144 mmol/L   Potassium 4.2 3.5 - 5.2 mmol/L   Chloride 102 96 - 106 mmol/L   CO2 22 18 - 29 mmol/L   Calcium 9.6 8.6 - 10.2 mg/dL   Total Protein 7.9 6.0 - 8.5 g/dL   Albumin 4.9 (H) 3.6 - 4.8 g/dL   Globulin, Total 3.0 1.5 - 4.5 g/dL   Albumin/Globulin Ratio 1.6 1.2 - 2.2   Bilirubin Total 0.7 0.0 - 1.2 mg/dL   Alkaline Phosphatase 115 39 - 117 IU/L   AST 30 0 - 40 IU/L   ALT  29 0 - 44 IU/L  Lipid Panel w/o Chol/HDL Ratio  Result Value Ref Range   Cholesterol, Total 239 (H) 100 - 199 mg/dL   Triglycerides 865 (H) 0 - 149 mg/dL   HDL 41 >78 mg/dL   VLDL Cholesterol Cal 45 (H) 5 - 40 mg/dL   LDL Calculated 469 (H) 0 - 99 mg/dL  PSA  Result Value Ref Range   Prostate Specific Ag, Serum 0.7 0.0 - 4.0 ng/mL  TSH  Result Value Ref Range   TSH 2.410 0.450 - 4.500 uIU/mL  Hepatitis C antibody  Result Value Ref Range   Hep C Virus Ab <0.1 0.0 - 0.9 s/co ratio  HIV antibody  Result Value Ref Range   HIV Screen 4th Generation wRfx Non Reactive Non Reactive      Assessment & Plan:   Problem List Items Addressed This Visit      Other   Lumbago - Primary    Chronic, ongoing.  CCM referral to assist in obtaining PT for patient.  Plan on repeat imaging in 3 months and possible ortho consult if changes and continued pain present.  Continue Meloxicam and Flexeril as needed + script for Icy/Hot lidocaine patches daily.  Return in 3 months.  She is to bring labs with him to next appointment.        Relevant Medications   cyclobenzaprine (FLEXERIL) 10 MG tablet   meloxicam (MOBIC) 15 MG tablet   Other Relevant Orders   Referral to Chronic Care Management Services       Follow up plan: Return in about 3 months (around 12/06/2018) for Back pain and follow-up.

## 2018-09-05 NOTE — Assessment & Plan Note (Signed)
Chronic, ongoing.  CCM referral to assist in obtaining PT for patient.  Plan on repeat imaging in 3 months and possible ortho consult if changes and continued pain present.  Continue Meloxicam and Flexeril as needed + script for Icy/Hot lidocaine patches daily.  Return in 3 months.  She is to bring labs with him to next appointment.

## 2018-09-05 NOTE — Patient Instructions (Signed)
Acute Back Pain, Adult  Acute back pain is sudden and usually short-lived. It is often caused by an injury to the muscles and tissues in the back. The injury may result from:   A muscle or ligament getting overstretched or torn (strained). Ligaments are tissues that connect bones to each other. Lifting something improperly can cause a back strain.   Wear and tear (degeneration) of the spinal disks. Spinal disks are circular tissue that provides cushioning between the bones of the spine (vertebrae).   Twisting motions, such as while playing sports or doing yard work.   A hit to the back.   Arthritis.  You may have a physical exam, lab tests, and imaging tests to find the cause of your pain. Acute back pain usually goes away with rest and home care.  Follow these instructions at home:  Managing pain, stiffness, and swelling   Take over-the-counter and prescription medicines only as told by your health care provider.   Your health care provider may recommend applying ice during the first 24-48 hours after your pain starts. To do this:  ? Put ice in a plastic bag.  ? Place a towel between your skin and the bag.  ? Leave the ice on for 20 minutes, 2-3 times a day.   If directed, apply heat to the affected area as often as told by your health care provider. Use the heat source that your health care provider recommends, such as a moist heat pack or a heating pad.  ? Place a towel between your skin and the heat source.  ? Leave the heat on for 20-30 minutes.  ? Remove the heat if your skin turns bright red. This is especially important if you are unable to feel pain, heat, or cold. You have a greater risk of getting burned.  Activity     Do not stay in bed. Staying in bed for more than 1-2 days can delay your recovery.   Sit up and stand up straight. Avoid leaning forward when you sit, or hunching over when you stand.  ? If you work at a desk, sit close to it so you do not need to lean over. Keep your chin tucked  in. Keep your neck drawn back, and keep your elbows bent at a right angle. Your arms should look like the letter "L."  ? Sit high and close to the steering wheel when you drive. Add lower back (lumbar) support to your car seat, if needed.   Take short walks on even surfaces as soon as you are able. Try to increase the length of time you walk each day.   Do not sit, drive, or stand in one place for more than 30 minutes at a time. Sitting or standing for long periods of time can put stress on your back.   Do not drive or use heavy machinery while taking prescription pain medicine.   Use proper lifting techniques. When you bend and lift, use positions that put less stress on your back:  ? Bend your knees.  ? Keep the load close to your body.  ? Avoid twisting.   Exercise regularly as told by your health care provider. Exercising helps your back heal faster and helps prevent back injuries by keeping muscles strong and flexible.   Work with a physical therapist to make a safe exercise program, as recommended by your health care provider. Do any exercises as told by your physical therapist.  Lifestyle   Maintain   a healthy weight. Extra weight puts stress on your back and makes it difficult to have good posture.   Avoid activities or situations that make you feel anxious or stressed. Stress and anxiety increase muscle tension and can make back pain worse. Learn ways to manage anxiety and stress, such as through exercise.  General instructions   Sleep on a firm mattress in a comfortable position. Try lying on your side with your knees slightly bent. If you lie on your back, put a pillow under your knees.   Follow your treatment plan as told by your health care provider. This may include:  ? Cognitive or behavioral therapy.  ? Acupuncture or massage therapy.  ? Meditation or yoga.  Contact a health care provider if:   You have pain that is not relieved with rest or medicine.   You have increasing pain going down  into your legs or buttocks.   Your pain does not improve after 2 weeks.   You have pain at night.   You lose weight without trying.   You have a fever or chills.  Get help right away if:   You develop new bowel or bladder control problems.   You have unusual weakness or numbness in your arms or legs.   You develop nausea or vomiting.   You develop abdominal pain.   You feel faint.  Summary   Acute back pain is sudden and usually short-lived.   Use proper lifting techniques. When you bend and lift, use positions that put less stress on your back.   Take over-the-counter and prescription medicines and apply heat or ice as directed by your health care provider.  This information is not intended to replace advice given to you by your health care provider. Make sure you discuss any questions you have with your health care provider.  Document Released: 05/25/2005 Document Revised: 12/30/2017 Document Reviewed: 01/06/2017  Elsevier Interactive Patient Education  2019 Elsevier Inc[employer]

## 2018-09-05 NOTE — Assessment & Plan Note (Signed)
Chronic, ongoing.  CCM referral to assist in obtaining PT for patient.  Plan on repeat imaging in 3 months and possible ortho consult if changes and continued pain present.  Continue Meloxicam and Flexeril as needed + script for Icy/Hot lidocaine patches daily.  Return in 3 months.  She is to bring labs with him to next appointment.   

## 2018-09-08 ENCOUNTER — Ambulatory Visit: Payer: Self-pay | Admitting: *Deleted

## 2018-09-08 ENCOUNTER — Telehealth: Payer: Self-pay

## 2018-09-08 ENCOUNTER — Other Ambulatory Visit: Payer: Self-pay

## 2018-09-08 DIAGNOSIS — M545 Low back pain: Principal | ICD-10-CM

## 2018-09-08 DIAGNOSIS — G8929 Other chronic pain: Secondary | ICD-10-CM

## 2018-09-08 NOTE — Patient Instructions (Signed)
Visit Information  Goals Addressed            This Visit's Progress   . "I would like to get PT and my medicine but workman's comp won't pay"  (pt-stated)       Current Barriers:  [name] Knowledge Deficits related to obtaining ordered treatments through his workman's comp. . Film/video editor.  . Language Barrier . Difficulty obtaining medications  Nurse Case Manager Clinical Goal(s):  [name] Over the next 30  days, patient will work with [facility] to address needs related to obtaining PT and medications for previous back injury.  . Over the next 30 days, patient will work with CM clinical social worker to make sure patient has proper medical coverage ie. Medicare . Over the next 30 days, patient will work with [facility] (community agency) to reduce back pain and improve daily function.   Interventions:  . Evaluation of current treatment plan related to back pain and patient's adherence to plan as established by provider. . Reviewed medications with patient and discussed patients inability to afford medications and language barrier with medication labels . Collaborated with [employer] Company, and Pharmacy  regarding the provess of patient's treatments and meds being filed with [employer] comp claim . Social Work referral for assistance making sure patient has an understanding of medicare benefit . Pharmacy referral for inability to affored medicatoins, and medication education  Patient Self Care Activities:  . Currently UNABLE TO independently obtain prescribed treatments for back pain  Initial goal documentation         Dale Morton was given information about Chronic Care Management services today including:  1. CCM service includes personalized support from designated clinical staff supervised by his physician, including individualized plan of care and coordination with other care providers 2. 24/7 contact phone numbers for assistance for urgent and routine care needs. 3. Service  will only be billed when office clinical staff spend 20 minutes or more in a month to coordinate care. 4. Only one practitioner may furnish and bill the service in a calendar month. 5. The patient may stop CCM services at any time (effective at the end of the month) by phone call to the office staff. 6. The patient will be responsible for cost sharing (co-pay) of up to 20% of the service fee (after annual deductible is met).  Patient agreed to services and verbal consent obtained.   The patient verbalized understanding of instructions provided today and declined a print copy of patient instruction materials.   The CM team will reach out to the patient again over the next 7 days.   Merlene Morse Ata Pecha RN, BSN Nurse Case Editor, commissioning Family Practice/[facility] Care Management  [phone]) Business [employer]

## 2018-09-08 NOTE — Chronic Care Management (AMB) (Signed)
Chronic Care Management   Initial Visit Note  09/08/2018 Name: IGNACIO LOWDER MRN: [id number] DOB: 06-15-1950  Referred by: Kathrine Haddock, NP Reason for referral : Chronic Care Management (initi)   MAURY GRONINGER is a 68 y.o. year old male who is a primary care patient of Kathrine Haddock, NP and Marnee Guarneri NP. The CCM team was consulted for assistance with chronic disease management and care coordination needs.   Review of patient status, including review of consultants reports, relevant laboratory and other test results, and collaboration with appropriate care team members and the patient's provider was performed as part of comprehensive patient evaluation and provision of chronic care management services.    Subjective: Used spanish interpreter to speak with Mr. Welty over the phone today. Mr. Bala stated after his injury in 1988 his workman's comp was to pay for any injury related MD visits, Medications and treatments. He has attempted in the past to complete physical therapy but was denied by workman's comp and could not afford the OOP cost. Patient stated he went to pick up his medications ordered at recent visit and was told medications were not covered and he could not afford to pick them up. Mr. Riechers stated he would appreciate any assistance I could give him in this matter.   SDOH (Social Determinants of Health) screening performed today. See Care Plan Entry related to challenges with: Financial Strain  and Language Barrier.  Objective:   Goals Addressed    "I would like to get PT and my medicine but workman's comp won't pay"  (pt-stated)     Current Barriers:  [name] Knowledge Deficits related to obtaining ordered treatments through his workman's comp. . Film/video editor.  . Language Barrier . Difficulty obtaining medications  Nurse Case Manager Clinical Goal(s):  [name] Over the next 30  days, patient will work with [facility] to address needs related to obtaining PT and medications  for previous back injury.  . Over the next 30 days, patient will work with CM clinical social worker to make sure patient has proper medical coverage ie. Medicare . Over the next 30 days, patient will work with [facility] (community agency) to reduce back pain and improve daily function.   Interventions:  . Evaluation of current treatment plan related to back pain and patient's adherence to plan as established by provider. . Reviewed medications with patient and discussed patients inability to afford medications and language barrier with medication labels . Collaborated with PT Company, and Pharmacy  regarding the provess of patient's treatments and meds being filed with [employer] comp claim . Social Work referral for assistance making sure patient has an understanding of medicare benefit . Pharmacy referral for inability to affored medicatoins, and medication education  Patient Self Care Activities:  . Currently UNABLE TO independently obtain prescribed treatments for back pain  Initial goal documentation        Mr. Golubski was given information about Chronic Care Management services today including:  1. CCM service includes personalized support from designated clinical staff supervised by his physician, including individualized plan of care and coordination with other care providers 2. 24/7 contact phone numbers for assistance for urgent and routine care needs. 3. Service will only be billed when office clinical staff spend 20 minutes or more in a month to coordinate care. 4. Only one practitioner may furnish and bill the service in a calendar month. 5. The patient may stop CCM services at any time (effective at the end of  the month) by phone call to the office staff. 6. The patient will be responsible for cost sharing (co-pay) of up to 20% of the service fee (after annual deductible is met).  Patient agreed to services and verbal consent obtained.   The CM team will reach  out to the patient again over the next 7 days.   Merlene Morse Ikeya Brockel RN, BSN Nurse Case Editor, commissioning Family Practice/[facility] Care Management  [phone]) Business [employer]

## 2018-09-12 ENCOUNTER — Ambulatory Visit: Payer: Self-pay | Admitting: *Deleted

## 2018-09-12 DIAGNOSIS — G8929 Other chronic pain: Secondary | ICD-10-CM

## 2018-09-12 DIAGNOSIS — M545 Low back pain, unspecified: Secondary | ICD-10-CM

## 2018-09-12 NOTE — Chronic Care Management (AMB) (Signed)
  Chronic Care Management   Note  09/12/2018 Name: SALAAM CERESA MRN: [id number] DOB: Aug 04, 1950  Placed a call to patient's Workman's Comp Case worker to obtain her assistance in obtaining PT and medications for patient through his Workman's Comp case. Was able to leave a message and am awaiting a return call.  Follow up plan: The CM team will reach out to the patient again over the next 7 days.   Plan to call caseworker back tomorrow for assistance.   Ma Rings Taneia Mealor RN, BSN Nurse Case Education officer, community Family Practice/[facility] Care Management  [phone]) Business [employer]

## 2018-09-12 NOTE — Patient Instructions (Signed)
Visit Information  Goals Addressed            This Visit's Progress   . "I would like to get PT and my medicine but workman's comp won't pay"  (pt-stated)       Current Barriers:  [name] Knowledge Deficits related to obtaining ordered treatments through his workman's comp. . Corporate treasurer.  . Language Barrier . Difficulty obtaining medications  Nurse Case Manager Clinical Goal(s):  [name] Over the next 30  days, patient will work with [facility] to address needs related to obtaining PT and medications for previous back injury.  . Over the next 30 days, patient will work with CM clinical social worker to make sure patient has proper medical coverage ie. Medicare . Over the next 30 days, patient will work with [facility] (community agency) to reduce back pain and improve daily function.   Interventions:  . Evaluation of current treatment plan related to back pain and patient's adherence to plan as established by provider. . Reviewed medications with patient and discussed patients inability to afford medications and language barrier with medication labels . Collaborated with PT Company, and Pharmacy  regarding the provess of patient's treatments and meds being filed with [employer] comp claim . Social Work referral for assistance making sure patient has an understanding of medicare benefit . Pharmacy referral for inability to affored medicatoins, and medication education  . Collaborated with PCP IT consultant to discuss forms needed for authorization of PT . Collaborated with CCM Pharmacist to discuss process for obtaining medications . Received a return call from [facility] adjuster Meredith Pel to discuss the process for patient obtaining PT and Medications.   Patient Self Care Activities:  . Currently UNABLE TO independently obtain prescribed treatments for back pain  Please see past updates related to this goal by clicking on the "Past Updates"  button in the selected goal          The patient verbalized understanding of instructions provided today and declined a print copy of patient instruction materials.   The CM team will reach out to the patient again over the next 7 days.   Ma Rings Chakira Jachim RN, BSN Nurse Case Education officer, community Family Practice/[facility] Care Management  [phone]) [employer]

## 2018-09-12 NOTE — Chronic Care Management (AMB) (Signed)
  Chronic Care Management   Follow Up Note   09/12/2018 Name: Dale Morton MRN: [id number] DOB: 28-Oct-1950  Referred by: Gabriel Cirri, NP Reason for referral : Care Coordination (F/u assisting wtih worker's comp authorization)   Dale Morton is a 68 y.o. year old male who is a primary care patient of Gabriel Cirri, NP. The CCM team was consulted for assistance with chronic disease management and care coordination needs.    Review of patient status, including review of consultants reports, relevant laboratory and other test results, and collaboration with appropriate care team members and the patient's provider was performed as part of comprehensive patient evaluation and provision of chronic care management services.    Goals Addressed    . "I would like to get PT and my medicine but workman's comp won't pay"  (pt-stated)       Current Barriers:  [name] Knowledge Deficits related to obtaining ordered treatments through his workman's comp. . Corporate treasurer.  . Language Barrier . Difficulty obtaining medications  Nurse Case Manager Clinical Goal(s):  [name] Over the next 30  days, patient will work with [facility] to address needs related to obtaining PT and medications for previous back injury.  . Over the next 30 days, patient will work with CM clinical social worker to make sure patient has proper medical coverage ie. Medicare . Over the next 30 days, patient will work with [facility] (community agency) to reduce back pain and improve daily function.   Interventions:  . Evaluation of current treatment plan related to back pain and patient's adherence to plan as established by provider. . Reviewed medications with patient and discussed patients inability to afford medications and language barrier with medication labels . Collaborated with PT Company, and Pharmacy  regarding the provess of patient's treatments and meds being filed with [employer] comp claim . Social Work referral  for assistance making sure patient has an understanding of medicare benefit . Pharmacy referral for inability to affored medicatoins, and medication education  . Collaborated with PCP IT consultant to discuss forms needed for authorization of PT . Collaborated with CCM Pharmacist to discuss process for obtaining medications . Received a return call from [facility] adjuster Meredith Pel to discuss the process for patient obtaining PT and Medications.   Patient Self Care Activities:  . Currently UNABLE TO independently obtain prescribed treatments for back pain  Please see past updates related to this goal by clicking on the "Past Updates" button in the selected goal         The CM team will reach out to the patient again over the next 7 days.   Ma Rings Ginger Leeth RN, BSN Nurse Case Education officer, community Family Practice/[facility] Care Management  [phone]) [employer]

## 2018-09-13 ENCOUNTER — Ambulatory Visit: Payer: Self-pay | Admitting: Pharmacist

## 2018-09-13 ENCOUNTER — Other Ambulatory Visit: Payer: Self-pay

## 2018-09-13 DIAGNOSIS — M545 Low back pain: Principal | ICD-10-CM

## 2018-09-13 DIAGNOSIS — G8929 Other chronic pain: Secondary | ICD-10-CM

## 2018-09-13 NOTE — Chronic Care Management (AMB) (Signed)
  Chronic Care Management   Follow Up Note   09/13/2018 Name: Dale Morton MRN: [id number] DOB: December 01, 1950  Referred by: Gabriel Cirri, NP Reason for referral : Chronic Care Management (Medication Management)   Dale Morton is a 68 y.o. year old male who is a primary care patient of Gabriel Cirri, NP. The CCM team was consulted for assistance with chronic disease management and care coordination needs.   Patient contacted through [employer], interpreter Rubin Payor assisted today.  Review of patient status, including review of consultants reports, relevant laboratory and other test results, and collaboration with appropriate care team members and the patient's provider was performed as part of comprehensive patient evaluation and provision of chronic care management services.    Goals Addressed            This Visit's Progress     Patient Stated   . "I would like to get PT and my medicine but workman's comp won't pay"  (pt-stated)       Current Barriers:  . Financial constraints/knowledge constraints- patient unsure of the process to obtain medications through workers comp; pharmacy noted that they did not have workers comp information on file to be able to run prescriptions through . Per call from workers comp Barrister's clerk to [employer], RN, patient needs to contact [employer] at [phone] to obtain this; they would need current address and and possibly claim number [id number]; injury date 08/11/86.   Pharmacist Clinical Goal(s):   Over the next 14 days, patient will work with PharmD and chronic care management team to receive services covered by workers compensation  Interventions:  . Counseled patient to contact [employer] to ask that a new card be sent to him. He expressed understanding   Patient Self Care Activities:  . Currently UNABLE TO independently obtain prescribed treatments for back pain  Please see past updates related to this goal by clicking  on the "Past Updates" button in the selected goal          Plan:  - CCM team will outreach patient within the week to follow up on this process  Catie Feliz Beam, PharmD Clinical Pharmacist [facility] Practice/[facility] [phone]

## 2018-09-13 NOTE — Patient Instructions (Signed)
Visit Information  Goals Addressed            This Visit's Progress     Patient Stated   . "I would like to get PT and my medicine but workman's comp won't pay"  (pt-stated)       Current Barriers:  . Financial constraints/knowledge constraints- patient unsure of the process to obtain medications through workers comp; pharmacy noted that they did not have workers comp information on file to be able to run prescriptions through . Per call from workers comp Barrister's clerk to [employer], RN, patient needs to contact [employer] at [phone] to obtain this; they would need current address and and possibly claim number [id number]; injury date 08/11/86.   Pharmacist Clinical Goal(s):   Over the next 14 days, patient will work with PharmD and chronic care management team to receive services covered by workers compensation  Interventions:  . Counseled patient to contact [employer] to ask that a new card be sent to him. He expressed understanding   Patient Self Care Activities:  . Currently UNABLE TO independently obtain prescribed treatments for back pain  Please see past updates related to this goal by clicking on the "Past Updates" button in the selected goal          The patient verbalized understanding of instructions provided today and declined a print copy of patient instruction materials.   Plan:  - CCM team will outreach patient within the week to follow up on this process  Catie Feliz Beam, PharmD Clinical Pharmacist [facility] Practice/[facility] [phone]

## 2018-09-14 ENCOUNTER — Telehealth: Payer: Self-pay

## 2018-09-20 ENCOUNTER — Ambulatory Visit: Payer: Self-pay | Admitting: *Deleted

## 2018-09-20 ENCOUNTER — Other Ambulatory Visit: Payer: Self-pay

## 2018-09-20 DIAGNOSIS — E782 Mixed hyperlipidemia: Secondary | ICD-10-CM

## 2018-09-20 DIAGNOSIS — G8929 Other chronic pain: Secondary | ICD-10-CM

## 2018-09-20 DIAGNOSIS — M545 Low back pain, unspecified: Secondary | ICD-10-CM

## 2018-09-20 NOTE — Patient Instructions (Signed)
Thank you allowing the Chronic Care Management Team to be a part of your care! It was a pleasure speaking with you today!  1. Continue Heart Healthy diet 2. Follow up with PCP for a physical this year 3. CCM RN will call you in the next two weeks to let you know the decision [facility] made about the PT.   CCM (Chronic Care Management) Team   Donelle Hise RN, BSN Nurse Care Coordinator  [phone]  Catie [facility] PharmD  Clinical Pharmacist  [phone]  Dickie La LCSW Clinical Social Worker [phone]  Goals Addressed            This Visit's Progress   . "I would like to get PT and my medicine but workman's comp won't pay"  (pt-stated)       Current Barriers:  . Financial constraints/knowledge constraints- patient unsure of the process to obtain medications through workers comp; pharmacy noted that they did not have workers comp information on file to be able to run prescriptions through . Per call from workers comp Barrister's clerk to [employer], RN, patient needs to contact [employer] at [phone] to obtain this; they would need current address and and possibly claim number [id number]; injury date 08/11/86.  [name] Patient has called for [employer] and paperwork has been sent to [facility] [place], Division of Workers' Compensation for review, but no return information has been recieved.   Pharmacist Clinical Goal(s):   Over the next 14 days, patient will work with PharmD and chronic care management team to receive services covered by workers compensation.  Interventions:  . Counseled patient to contact [employer] to ask that a new card be sent to him. He expressed understanding-Patient has requested card. Marden Noble at PCP office to confirm all forms had been sent.  Contacted patient with interpreter Maryjane Hurter) to make him aware all forms had been sent and we are awaiting a decision, once decision has been made we will make him aware and go forward from  there.  Patient Self Care Activities:  . Currently UNABLE TO independently obtain prescribed treatments for back pain    Please see past updates related to this goal by clicking on the "Past Updates" button in the selected goal       . Can I use medicare to come to the doctor for regular check ups (pt-stated)       Current Barriers:  [name] Knowledge Deficits related to medicare coverage and payment  Nurse Case Manager Clinical Goal(s):  [name] Over the next 30  days, patient will verbalize understanding of plan for using Medicare to maintain health . Over the next 90 days, patient will work with nurse care manager to address needs related to maintaining health by obtaining physicals through PCP office.   Interventions:  . Evaluation of current treatment plan related to back pain and patient's adherence to plan as established by provider. . Advised patient to contact PCP office to schedule a physical  . Collaborated with PCP regarding the patient's need for annual physical  Patient Self Care Activities:  . Performs ADL's independently . Performs IADL's independently  Initial goal documentation        The patient verbalized understanding of instructions provided today and declined a print copy of patient instruction materials.  High Cholesterol information will be mailed to patient in spanish.   The CM team will reach out to the patient again over the next 14 days.   Prevencin del colesterol  alto Preventing High Cholesterol El colesterol es una sustancia cerosa parecida a la grasa que el organismo necesita en pequeas cantidades. El hgado fabrica todo el colesterol que el cuerpo necesita. Tener el colesterol alto (hipercolesterolemia) aumenta el riesgo de sufrir enfermedades cardacas y accidentes cerebrovasculares. El colesterol extra (exceso de colesterol) proviene de los alimentos que come, por ejemplo grasas de origen animal (grasas saturadas) de la carne y de algunos productos  lcteos. El colesterol alto con frecuencia puede prevenirse con cambios en la dieta y en el estilo de vida. Si ya tiene Engineer, production, puede controlarlo haciendo cambios en la dieta y en el estilo de vida, adems de con medicamentos. Qu cambios en la alimentacin se pueden hacer?  Coma menos grasas saturadas. Los alimentos que contienen grasas saturadas incluyen las carnes rojas y algunos productos lcteos.  Evite las carnes procesadas, como el tocino, los fiambres y embutidos.  Evite las grasas trans, que se encuentran en la margarina y en algunos productos horneados.  Evite alimentos y bebidas que tengan azcares agregados.  Consuma ms frutas, verduras y cereales integrales.  Elija fuentes saludables de protenas, como el pescado, la carne de ave y los frutos secos.  Elija fuentes saludables de grasas, por ejemplo: ? Frutos secos. ? Aceites vegetales, en particular el aceite de oliva. ? Pescados que contengan grasas saludables (cidos grasos omega-3), como la caballa o el salmn. Qu cambios en el estilo de vida se pueden realizar?   Baje de peso si es necesario. Bajar entre 5 y 10lb (2,3 a 4,5kg) puede ayudar a prevenir o Public house manager alto y a Software engineer de padecer diabetes y presin arterial alta (hipertensin). Pdale al mdico que le recomiende una dieta y un plan de ejercicios para bajar de peso de forma segura.  Ejerctese lo suficiente. Debe realizar al menos de ejercicios de intensidad moderada todas las semanas. ? Theatre stage manager en sesiones cortas de ejercicios, varias veces al da, o puede realizar sesiones ms largas, pero menos veces por semana. Por ejemplo, puede realizar una caminata enrgica o andar en bicicleta durante , 3veces al da, durante 5das a la semana.  No fume. Si necesita ayuda para dejar de fumar, consulte al mdico.  Limite el consumo de bebidas alcohlicas. Si bebe alcohol, limite el consumo a  no ms de por da si es mujer y no est [place], y por da si es hombre. Una medida equivale a 12onzas de cerveza, 5onzas de vino o 1onzas de bebidas alcohlicas de alta graduacin. Por qu son importantes estos cambios?  Si tiene Engineer, production, se pueden acumular depsitos de esta sustancia (placa) en las paredes de los vasos sanguneos. La placa hace que las arterias se vuelvan ms estrechas y rgidas, lo que puede limitar u obstruir la circulacin sangunea y Development worker, international aid la formacin de cogulos de [place]. Esto aumenta en gran medida el riesgo de infarto de miocardio y de accidente cerebrovascular. Hacer cambios en la dieta y en el estilo de vida puede ayudar a reducir el riesgo de sufrir estas afecciones potencialmente mortales. Qu puedo hacer para reducir mis riesgos?  Controle los factores de riesgo del colesterol alto. Hable con el mdico acerca de todos los factores de riesgo y cmo reducir Nurse, adult.  Controle otras afecciones que pueda tener, por ejemplo diabetes o presin arterial alta (hipertensin).  Contrlese el colesterol a intervalos regulares.  Concurra a todas las visitas de control como se lo haya indicado el mdico. Esto es  importante. Cmo se trata? Adems de los cambios en la dieta y en el estilo de vida, el mdico puede recomendarle que tome ciertos medicamentos para reducir el colesterol, por ejemplo, medicamentos que reducen la cantidad de colesterol producida por el hgado. Es posible que necesite tomar medicamentos si:  No logra reducir lo suficiente el colesterol con cambios en la dieta y en el estilo de vida.  Tiene colesterol alto y presenta otros factores de riesgo de sufrir enfermedades cardacas o accidentes cerebrovasculares. Tome los medicamentos de venta libre y los recetados solamente como se lo haya indicado el mdico. Dnde encontrar ms informacin  Asociacin Estadounidense de FirefighterCardiologa (Facilities managerAmerican Heart Association, Psychologist, educationalAHA):  1122334455www.heart.org/HEARTORG/Conditions/Cholesterol/Cholesterol_UCM_001089_SubHomePage.jsp  Training and development officernstituto Nacional de FirefighterCardiologa, Corporate investment bankereumologa y Teacher, English as a foreign languageHematologa (Armed forces training and education officerational Heart, Lung, and Commercial Metals CompanyBlood Institute, NHLBI): http://hood.com/www.nhlbi.nih.gov/health/resources/heart/heart-cholesterol-hbc-what-html Resumen  El colesterol alto aumenta el riesgo de sufrir enfermedades cardacas y accidentes cerebrovasculares. Si mantiene el colesterol bajo, puede reducir el riesgo de tener estas afecciones.  Los Allied Waste Industriescambios en la dieta y en el estilo de vida son los pasos ms importantes para prevenir Nurse, learning disabilityel colesterol alto.  Consulte al mdico para controlar los factores de riesgo y hgase anlisis de sangre con regularidad. Esta informacin no tiene Theme park managercomo fin reemplazar el consejo del mdico. Asegrese de hacerle al mdico cualquier pregunta que tenga. Document Released: 06/09/2015 Document Revised: 09/02/2016 Document Reviewed: 06/09/2015 Elsevier Interactive Patient Education  2019 [employer]

## 2018-09-20 NOTE — Chronic Care Management (AMB) (Signed)
Chronic Care Management   Follow Up Note   09/20/2018 Name: Hal HopeJose E Iannaccone MRN: [id number] DOB: 1951-03-14  Referred by: Gabriel CirriWicker, Cheryl, NP Reason for referral : Chronic Care Management (F/U on PT for back forms) and Care Coordination (PCP office)   Hal HopeJose E Hymon is a 68 y.o. year old male who is a primary care patient of Gabriel CirriWicker, Cheryl, NP. The CCM team was consulted for assistance with chronic disease management and care coordination needs.    Review of patient status, including review of consultants reports, relevant laboratory and other test results, and collaboration with appropriate care team members and the patient's provider was performed as part of comprehensive patient evaluation and provision of chronic care management services.    Spoke with Mr. Lonna CobbRomero over the phone with interpreter. We discussed the paperwork for his PT and Medications are in process and I will reach out to him as soon as I know the decision. We also discussed the patient has Medicare and is eligible to use this insurance for PCP appointments. We discussed the cost he would be responsible for is 20% of the service fee. We discussed his cholesterol was high in 2018 and he agreed for me to mail him some teaching information regarding lowering your cholesterol.   Goals Addressed            This Visit's Progress   . "I would like to get PT and my medicine but workman's comp won't pay"  (pt-stated)       Current Barriers:  . Financial constraints/knowledge constraints- patient unsure of the process to obtain medications through workers comp; pharmacy noted that they did not have workers comp information on file to be able to run prescriptions through . Per call from workers comp Barrister's clerkadjuster to [employer] Sherece Gambrill, RN, patient needs to contact [employer] at [phone] to obtain this; they would need current address and and possibly claim number [id number]; injury date 08/11/86.  [name]. Patient has called for [employer]  and paperwork has been [facility] [place], Division of [place]' [place] for review, but no return information has been recieved.   Pharmacist Clinical Goal(s):   Over the next 14 days, patient will work with PharmD and chronic care management team to receive services covered by workers compensation.  Interventions:  . Counseled patient to contact [employer] to ask that a new card be sent to him. He expressed understanding-Patient has requested card. Marden Noble. Contacted Katina at PCP office to confirm all forms had been sent.  Contacted patient with interpreter Maryjane Hurter([place]) to make him aware all forms had been sent and we are awaiting a decision, once decision has been made we will make him aware and go forward from there.  Patient Self Care Activities:  . Currently UNABLE TO independently obtain prescribed treatments for back pain    Please see past updates related to this goal by clicking on the "Past Updates" button in the selected goal       . Can I use medicare to come to the doctor for regular check ups (pt-stated)       Current Barriers:  [name]. Knowledge Deficits related to medicare coverage and payment  Nurse Case Manager Clinical Goal(s):  [name]. Over the next 30  days, patient will verbalize understanding of plan for using Medicare to maintain health . Over the next 90 days, patient will work with nurse care manager to address needs related to maintaining health by obtaining physicals through PCP office.  Interventions:  . Evaluation of current treatment plan related to back pain and patient's adherence to plan as established by provider. . Advised patient to contact PCP office to schedule a physical  . Collaborated with PCP regarding the patient's need for annual physical  Patient Self Care Activities:  . Performs ADL's independently . Performs IADL's independently  Initial goal documentation         The CM team will reach out to the patient again over the next 14  days.    Ma Rings Taytum Scheck RN, BSN Nurse Case Education officer, community Family Practice/[facility] Care Management  [phone]) Business Mobile

## 2018-10-11 ENCOUNTER — Telehealth: Payer: Self-pay

## 2018-10-19 ENCOUNTER — Telehealth: Payer: Self-pay

## 2018-10-20 ENCOUNTER — Telehealth: Payer: Self-pay

## 2018-10-21 ENCOUNTER — Ambulatory Visit: Payer: Self-pay | Admitting: *Deleted

## 2018-10-21 ENCOUNTER — Telehealth: Payer: Self-pay

## 2018-10-21 DIAGNOSIS — G8929 Other chronic pain: Secondary | ICD-10-CM

## 2018-10-21 DIAGNOSIS — M545 Low back pain, unspecified: Secondary | ICD-10-CM

## 2018-10-21 NOTE — Chronic Care Management (AMB) (Signed)
  Chronic Care Management   Follow Up Note   10/21/2018 Name: Dale Morton MRN: [id number] DOB: [birth year]  Referred by: [name], NP Reason for referral : No chief complaint on file.   Dale Morton is a 68 y.o. year old male who is a primary care patient of [name], Dorie Rank, NP. The CCM team was consulted for assistance with chronic disease management and care coordination needs.    Review of patient status, including review of consultants reports, relevant laboratory and other test results, and collaboration with appropriate care team members and the patient's provider was performed as part of comprehensive patient evaluation and provision of chronic care management services.    Goals Addressed            This Visit's Progress   . "I would like to get PT and my medicine but workman's comp won't pay"  (pt-stated)       Current Barriers:  . Financial constraints/knowledge constraints- patient unsure of the process to obtain medications through workers comp; pharmacy noted that they did not have workers comp information on file to be able to run prescriptions through . Per call from workers comp Barrister's clerk to [employer], RN, patient needs to contact [employer] at [phone] to obtain this; they would need current address and and possibly claim number [id number]; injury date 08/11/86.  [name] Patient has called for [employer] and paperwork has been sent to [facility] [place], Division of Workers' Compensation for review, but no return information has been recieved.   Pharmacist Clinical Goal(s):   Over the next 14 days, patient will work with PharmD and chronic care management team to receive services covered by workers compensation.  Interventions:   Email correspondence to office manager Laurelyn Sickle in regards to decisions about patient's care. PT frequency notes sent to workers [employer]. Will await approval decision. In referral From [place], " Emailed Meredith Pel  with [facility] regarding status of authorization for PT and medication.  Per Misty Stanley, RFA form did not indicate frequency of PT therefore must be added before a decision can be made. Once provider indicates frequency send an email with patient's name, dob, and claim# to [email or web address] for review and authorization determination"  Patient Self Care Activities:  . Currently UNABLE TO independently obtain prescribed treatments for back pain    Please see past updates related to this goal by clicking on the "Past Updates" button in the selected goal           The CM team will reach out to the patient again over the next 14 days.  The patient has been provided with contact information for the chronic care management team and has been advised to call with any health related questions or concerns.    Ma Rings Luann Aspinwall RN, BSN Nurse Case Education officer, community Family Practice/[facility] Care Management  [phone]) Business Mobile

## 2018-10-26 ENCOUNTER — Ambulatory Visit: Payer: Self-pay | Admitting: Pharmacist

## 2018-10-26 DIAGNOSIS — G8929 Other chronic pain: Secondary | ICD-10-CM

## 2018-10-26 NOTE — Chronic Care Management (AMB) (Signed)
  Chronic Care Management   Follow Up Note   10/26/2018 Name: Dale Morton MRN: [id number] DOB: [birth year]  Referred by: [name], NP Reason for referral : Chronic Care Management (Medication Management)   ERMIN ZOLTOWSKI is a 68 y.o. year old male who is a primary care patient of [name], Dorie Rank, NP. The CCM team was consulted for assistance with chronic disease management and care coordination needs.    Received message from Janci Minor, RN that patient had still not received his medications with workers comp coverage.  Review of patient status, including review of consultants reports, relevant laboratory and other test results, and collaboration with appropriate care team members and the patient's provider was performed as part of comprehensive patient evaluation and provision of chronic care management services.    Goals Addressed            This Visit's Progress     Patient Stated   . "I would like to get PT and my medicine but workman's comp won't pay"  (pt-stated)       Current Barriers:  . Financial constraints/knowledge constraints- patient unsure of the process to obtain medications through workers comp; pharmacy noted that they did not have workers comp information on file to be able to run prescriptions through o Patient notes that he called [employer] and request a new card, but it never came in the mail. He called again last Friday and is awaiting the mail . Per call from workers comp Barrister's clerk to [employer], RN, patient needs to contact [employer] at [phone] to obtain this; they would need current address and and possibly claim number [id number]; injury date 08/11/86.  [name] Patient has called for [employer] and paperwork has been sent to [facility] [place], Division of Workers' Compensation for review, but no return information has been recieved.   Pharmacist Clinical Goal(s):   Over the next 30 days, patient will work with PharmD and  chronic care management team to receive services covered by workers compensation.  Interventions:   Called [employer] on behalf of the patient; they gave me the following card information: BIN [id number], PCN WC, Group [id number], ID [id number]. Called this information to [facility] and they were still receiving a rejection. I provided the help desk phone number to the [facility] pharmacist, she was going to call and try to trouble shoot. I provided her my contact information to reach out with updates.   Patient Self Care Activities:  . Currently UNABLE TO independently obtain prescribed treatments for back pain    Please see past updates related to this goal by clicking on the "Past Updates" button in the selected goal          Plan:  - Will f/u with [facility] if I do not hear back within 1 week, and will update patient accordingly  Catie Feliz Beam, PharmD Clinical Pharmacist [facility] [phone]

## 2018-10-26 NOTE — Patient Instructions (Signed)
Visit Information  Goals Addressed            This Visit's Progress     Patient Stated   . "I would like to get PT and my medicine but workman's comp won't pay"  (pt-stated)       Current Barriers:  . Financial constraints/knowledge constraints- patient unsure of the process to obtain medications through workers comp; pharmacy noted that they did not have workers comp information on file to be able to run prescriptions through o Patient notes that he called [employer] and request a new card, but it never came in the mail. He called again last Friday and is awaiting the mail . Per call from workers comp Barrister's clerk to [employer], RN, patient needs to contact [employer] at [phone] to obtain this; they would need current address and and possibly claim number [id number]; injury date 08/11/86.  [name] Patient has called for [employer] and paperwork has been sent to [facility] [place], Division of Workers' Compensation for review, but no return information has been recieved.   Pharmacist Clinical Goal(s):   Over the next 30 days, patient will work with PharmD and chronic care management team to receive services covered by workers compensation.  Interventions:   Called [employer] on behalf of the patient; they gave me the following card information: BIN [id number], PCN WC, Group [id number], ID [id number]. Called this information to [facility] and they were still receiving a rejection. I provided the help desk phone number to the [facility] pharmacist, she was going to call and try to trouble shoot. I provided her my contact information to reach out with updates.   Patient Self Care Activities:  . Currently UNABLE TO independently obtain prescribed treatments for back pain    Please see past updates related to this goal by clicking on the "Past Updates" button in the selected goal          The patient verbalized understanding of instructions provided today and declined a print copy of  patient instruction materials.   Plan:  - Will f/u with [facility] if I do not hear back within 1 week, and will update patient accordingly  Catie Feliz Beam, PharmD Clinical Pharmacist [facility] [phone]

## 2018-10-28 ENCOUNTER — Telehealth: Payer: Self-pay

## 2018-11-04 ENCOUNTER — Ambulatory Visit: Payer: Self-pay | Admitting: Pharmacist

## 2018-11-04 ENCOUNTER — Telehealth: Payer: Self-pay

## 2018-11-04 DIAGNOSIS — M545 Low back pain, unspecified: Secondary | ICD-10-CM

## 2018-11-04 DIAGNOSIS — G8929 Other chronic pain: Secondary | ICD-10-CM

## 2018-11-04 NOTE — Chronic Care Management (AMB) (Signed)
  Chronic Care Management   Follow Up Note   11/04/2018 Name: Dale Morton MRN: [id number] DOB: 26-Mar-1951  Referred by: Dale Skiff, NP Reason for referral : Chronic Care Management (Medication Access)   Dale Morton is a 68 y.o. year old male who is a primary care patient of [name], Dale Rank, NP. The CCM team was consulted for assistance with chronic disease management and care coordination needs.    Care coordination completed today.  Review of patient status, including review of consultants reports, relevant laboratory and other test results, and collaboration with appropriate care team members and the patient's provider was performed as part of comprehensive patient evaluation and provision of chronic care management services.    Goals Addressed            This Visit's Progress     Patient Stated   . "I would like to get PT and my medicine but workman's comp won't pay"  (pt-stated)       Current Barriers:  . Financial constraints/knowledge constraints- patient unsure of the process to obtain medications through workers comp; pharmacy noted that they did not have workers comp information on file to be able to run prescriptions through o Patient notes that he called [employer] and request a new card, but it never came in the mail. He called again last Friday and is awaiting the mail . Per call from workers comp Barrister's clerk to [employer], RN, patient needs to contact [facility] at [phone] to obtain this; they would need current address and and possibly claim number [id number]; injury date 08/11/86.  Dale Morton Patient has called for [employer] cars and paperwork has been sent to [facility] [place], Division of Workers' Compensation for review, but no return information has been recieved.  [facility] - workers comp pharmacy coverage is still not going through, stating a PA is required  Pharmacist Clinical Goal(s):   Over the next 30 days, patient  will work with PharmD and chronic care management team to receive services covered by workers compensation.  Interventions:   Will continue to collaborate with Janci Minor, RN to see what needs to be done next  Patient Self Care Activities:  . Currently UNABLE TO independently obtain prescribed treatments for back pain    Please see past updates related to this goal by clicking on the "Past Updates" button in the selected goal           Plan:  - CCM team will continue to collaborate with primary care office to attempt to troubleshoot workers compensation coverage  Catie Feliz Beam, PharmD Clinical Pharmacist [facility] [phone]

## 2018-11-04 NOTE — Patient Instructions (Signed)
Visit Information  Goals Addressed            This Visit's Progress     Patient Stated   . "I would like to get PT and my medicine but workman's comp won't pay"  (pt-stated)       Current Barriers:  . Financial constraints/knowledge constraints- patient unsure of the process to obtain medications through workers comp; pharmacy noted that they did not have workers comp information on file to be able to run prescriptions through o Patient notes that he called [employer] and request a new card, but it never came in the mail. He called again last Friday and is awaiting the mail . Per call from workers comp Barrister's clerk to [employer], RN, patient needs to contact [facility] [employer] at [phone] to obtain this; they would need current address and and possibly claim number [id number]; injury date 08/11/86.  [name] Patient has called for [employer] and paperwork has been sent to [facility] [place], Division of Workers' Compensation for review, but no return information has been recieved.  [facility] - workers comp pharmacy coverage is still not going through, stating a PA is required  Pharmacist Clinical Goal(s):   Over the next 30 days, patient will work with PharmD and chronic care management team to receive services covered by workers compensation.  Interventions:   Will continue to collaborate with Janci Minor, RN to see what needs to be done next  Patient Self Care Activities:  . Currently UNABLE TO independently obtain prescribed treatments for back pain    Please see past updates related to this goal by clicking on the "Past Updates" button in the selected goal          The patient verbalized understanding of instructions provided today and declined a print copy of patient instruction materials.   Plan:  - CCM team will continue to collaborate with primary care office to attempt to troubleshoot workers compensation coverage  Catie Feliz Beam,  PharmD Clinical Pharmacist [facility] [phone]

## 2018-11-09 ENCOUNTER — Telehealth: Payer: Self-pay

## 2018-11-11 ENCOUNTER — Telehealth: Payer: Self-pay

## 2018-11-11 ENCOUNTER — Ambulatory Visit: Payer: Self-pay | Admitting: *Deleted

## 2018-11-11 DIAGNOSIS — G8929 Other chronic pain: Secondary | ICD-10-CM

## 2018-11-11 DIAGNOSIS — M545 Low back pain, unspecified: Secondary | ICD-10-CM

## 2018-11-11 NOTE — Chronic Care Management (AMB) (Signed)
  Chronic Care Management   Follow Up Note   11/11/2018 Name: ABANOUB SHABAN MRN: [id number] DOB: 01/25/1951  Referred by: [name], NP Reason for referral : Care Coordination (Workman's Comp for PT/MEDS)   FERD MUSA is a 68 y.o. year old male who is a primary care patient of [name], Dorie Rank, NP. The CCM team was consulted for assistance with chronic disease management and care coordination needs.    Review of patient status, including review of consultants reports, relevant laboratory and other test results, and collaboration with appropriate care team members and the patient's provider was performed as part of comprehensive patient evaluation and provision of chronic care management services.    Goals Addressed            This Visit's Progress   . "I would like to get PT and my medicine but workman's comp won't pay"  (pt-stated)       Current Barriers:  . Financial constraints/knowledge constraints- patient unsure of the process to obtain medications through workers comp; pharmacy noted that they did not have workers comp information on file to be able to run prescriptions through o Patient notes that he called [employer] and request a new card, but it never came in the mail. He called again last Friday and is awaiting the mail . Per call from workers comp Barrister's clerk to [employer], RN, patient needs to contact [facility] [employer] at [phone] to obtain this; they would need current address and and possibly claim number [id number]; injury date 08/11/86.  [name] Patient has called for [employer] and paperwork has been sent to [facility] [place], Division of Workers' Compensation for review, but no return information has been recieved.  Geradine Girt [facility] - workers comp pharmacy coverage is still not going through, stating a PA is required.  Pharmacist Clinical Goal(s):   Over the next 30 days, patient will work with PharmD and chronic care management team to  receive services covered by workers compensation.  Interventions:   Will continue to collaborate with Kiree Dejarnette, RN to see what needs to be done next . Email sent to Workers Comp Rep Meredith Pel to further inquire about patient's PT and Medications. Awaiting a response.      Patient Self Care Activities:  . Currently UNABLE TO independently obtain prescribed treatments for back pain    Please see past updates related to this goal by clicking on the "Past Updates" button in the selected goal           The care management team will reach out to the patient again over the next 14 days.  The patient has been provided with contact information for the care management team and has been advised to call with any health related questions or concerns.    Ma Rings Kahlea Cobert RN, BSN Nurse Case Education officer, community Family Practice/[facility]  [phone]) Business Mobile

## 2018-11-18 ENCOUNTER — Ambulatory Visit: Payer: Self-pay | Admitting: *Deleted

## 2018-11-18 NOTE — Chronic Care Management (AMB) (Signed)
Chronic Care Management   Follow Up Note   11/18/2018 Name: Dale Morton MRN: [id number] DOB: 1951/05/04  Referred by: Dale Lick, NP Reason for referral : Care Coordination (PT through Workers comp)   Dale Morton is a 68 y.o. year old male who is a primary care patient of [name], Dale Faster, NP. The CCM team was consulted for assistance with chronic disease management and care coordination needs.    Review of patient status, including review of consultants reports, relevant laboratory and other test results, and collaboration with appropriate care team members and the patient's provider was performed as part of comprehensive patient evaluation and provision of chronic care management services.    Goals Addressed            This Visit's Progress   . "I would like to get PT and my medicine but workman's comp won't pay"  (pt-stated)       Current Barriers:  . Financial constraints/knowledge constraints- patient unsure of the process to obtain medications through workers comp; pharmacy noted that they did not have workers comp information on file to be able to run prescriptions through o Patient notes that he called Dale Morton [employer] Dale Morton and request a new card, but it never came in the mail. He called again last Friday and is awaiting the mail . Per call from workers comp IT consultant to [employer], RN, patient needs to contact Dale Morton at [phone] to obtain this; they would need current address and and possibly claim number [id number]; injury date 08/11/86.  [name] Patient has called for Dale Morton cars and paperwork has been sent to Dale Morton, Dale Morton of Dale Morton for review, but no return information has been recieved.  . Dale Morton coverage is still not going through, stating a PA is required.  Pharmacist Clinical Goal(s):   Over the next 30 days, patient will work with PharmD and chronic care management team to  receive services covered by workers Morton.  CCM RN Clinical Goal: Patient will receive PT through worker's comp   Interventions:   Email received from PCP Office Manager patient's PT was approved through Dale Morton.   Call placed to Dale Morton [facility] to assist patient in getting scheduled, Dale Morton's PT this am they said they have been in touch with the patient for PT and he is scheduled for 6/15 at 4 pm. They said a company called Dale Morton called them with an interpreter to schedule the patient.  Email sent to PCP Office manager to make her aware: Dale Morton at [employer] did say she needed you guys to Fax a MD referral to them.  ATTNOlivia Morton   [phone]  Med Risk # [id number] Spoke with patient through the interpreter and made sure he knew about appointment, he requested the address: Dale Morton Patient Self Care Activities:  . Currently UNABLE TO independently obtain prescribed treatments for back pain     Please see past updates related to this goal by clicking on the "Past Updates" button in the selected goal       . COMPLETED: Can I use medicare to come to the doctor for regular check ups (pt-stated)       Current Barriers:  [name] Knowledge Deficits related to medicare coverage and payment  Nurse Case Manager Clinical Goal(s):  [name] Over the next 30  days, patient will verbalize understanding of plan for using Medicare to maintain health . Over the next 90 days, patient will  work with nurse care Production designer, theatre/television/filmmanager to address needs related to maintaining health by obtaining physicals through PCP office.   Interventions:  . Evaluation of current treatment plan related to back pain and patient's adherence to plan as established by provider. . Advised patient to contact PCP office to schedule a physical  . Collaborated with PCP regarding the patient's need for annual physical . Patient aware medicare will cover regular physical's with a co-pay  Patient Self Care Activities:   . Performs ADL's independently . Performs IADL's independently  Please see past updates related to this goal by clicking on the "Past Updates" button in the selected goal          The patient has been provided with contact information for the care management team and has been advised to call with any health related questions or concerns.  No further follow up required: with RNCM- CCM Pharmacist continues to work with this patient on obtaining medicatons   Dale Tabone RN, BSN Nurse Case Education officer, communityManager [facility]  [phone]) Business Mobile

## 2018-12-06 ENCOUNTER — Other Ambulatory Visit: Payer: Self-pay

## 2018-12-06 ENCOUNTER — Encounter: Payer: Self-pay | Admitting: Nurse Practitioner

## 2018-12-06 ENCOUNTER — Ambulatory Visit (INDEPENDENT_AMBULATORY_CARE_PROVIDER_SITE_OTHER): Payer: Medicare Other | Admitting: Nurse Practitioner

## 2018-12-06 DIAGNOSIS — M545 Low back pain, unspecified: Secondary | ICD-10-CM

## 2018-12-06 DIAGNOSIS — G8929 Other chronic pain: Secondary | ICD-10-CM

## 2018-12-06 MED ORDER — METHOCARBAMOL 500 MG PO TABS: 500.0000 mg | ORAL_TABLET | ORAL | 0 refills | Status: DC | PRN

## 2018-12-06 NOTE — Patient Instructions (Signed)

## 2018-12-06 NOTE — Progress Notes (Signed)
BP 133/81   Pulse 83   Temp 99 F (37.2 C) (Oral)   SpO2 97%    Subjective:    Patient ID: Dale Morton, male    DOB: 07-Feb-1951, 68 y.o.   MRN: [id number]  HPI: Dale HopeJose E Crespo is a 68 y.o. male  Chief Complaint  Patient presents with  . Back Pain    follow up   Medical interpreter on phone at bedside to assist with visit.  BACK PAIN Is a chronic, ongoing issue since work accident in 631988 or 1989.  During accident he was pulling a deck and it got broken, then pulled back during movement + has h/o falling from height during work.  Has been attending physical therapy, which he says is improving pain.  Has noticed some numbness in hands, but feels this is due to therapy time.  Has been using Flexeril and Meloxicam at home.  Has been using Meloxicam when he has pain, every 4 days because if he takes it more feels like his stomach burns.  Flexeril he uses every four days at this time, reports it makes him sleepy so tries not to use it as much and uses only at bedtime.  Brought his labs from his recent physical in [place], will scan into chart.   Duration: months Mechanism of injury: work accident Location: low back Onset: chronic Severity: 5/10 at worst and at best 2/10 -- worst when lifts something or when he does not move for long period Quality: dull and burning, mainly on right side Frequency: intermittent Radiation: none Aggravating factors: lifting and prolonged sitting Alleviating factors: NSAIDs and muscle relaxer Status: fluctuating Treatments attempted: Flexeril and ibuprofen  Relief with NSAIDs?: moderate Nighttime pain:  no Paresthesias / decreased sensation:  no Bowel / bladder incontinence:  no Fevers:  no Dysuria / urinary frequency:  no  Relevant past medical, surgical, family and social history reviewed and updated as indicated. Interim medical history since our last visit reviewed. Allergies and medications reviewed and updated.  Review of Systems   Constitutional: Negative for activity change, diaphoresis, fatigue and fever.  Respiratory: Negative for cough, chest tightness, shortness of breath and wheezing.   Cardiovascular: Negative for chest pain, palpitations and leg swelling.  Gastrointestinal: Negative for abdominal distention, abdominal pain, constipation, diarrhea, nausea and vomiting.  Musculoskeletal: Positive for back pain.  Skin: Negative.   Neurological: Negative for dizziness, syncope, weakness, light-headedness, numbness and headaches.  Psychiatric/Behavioral: Negative.     Per HPI unless specifically indicated above     Objective:    BP 133/81   Pulse 83   Temp 99 F (37.2 C) (Oral)   SpO2 97%   Wt Readings from Last 3 Encounters:  09/05/18 196 lb (88.9 kg)  04/01/18 197 lb 12.8 oz (89.7 kg)  12/17/17 192 lb 12.8 oz (87.5 kg)    Physical Exam Vitals signs and nursing note reviewed.  Constitutional:      General: He is not in acute distress.    Appearance: He is well-developed. He is not ill-appearing.  HENT:     Head: Normocephalic and atraumatic.     Right Ear: Hearing normal. No drainage.     Left Ear: Hearing normal. No drainage.     Mouth/Throat:     Pharynx: Uvula midline.  Eyes:     General: Lids are normal.        Right eye: No discharge.        Left eye: No discharge.  Conjunctiva/sclera: Conjunctivae normal.     Pupils: Pupils are equal, round, and reactive to light.  Neck:     Musculoskeletal: Normal range of motion and neck supple.     Thyroid: No thyromegaly.     Vascular: No carotid bruit or JVD.     Trachea: Trachea normal.  Cardiovascular:     Rate and Rhythm: Normal rate and regular rhythm.     Heart sounds: Normal heart sounds, S1 normal and S2 normal. No murmur. No gallop.   Pulmonary:     Effort: Pulmonary effort is normal. No accessory muscle usage or respiratory distress.     Breath sounds: Normal breath sounds.  Abdominal:     General: Bowel sounds are normal.      Palpations: Abdomen is soft. There is no hepatomegaly or splenomegaly.  Musculoskeletal:     Lumbar back: He exhibits decreased range of motion. He exhibits no tenderness, no swelling, no edema, no laceration and no pain.     Right lower leg: No edema.     Left lower leg: No edema.  Skin:    General: Skin is warm and dry.     Capillary Refill: Capillary refill takes less than 2 seconds.     Findings: No rash.  Neurological:     Mental Status: He is alert and oriented to person, place, and time.     Deep Tendon Reflexes: Reflexes are normal and symmetric.  Psychiatric:        Mood and Affect: Mood normal.        Behavior: Behavior normal.        Thought Content: Thought content normal.        Judgment: Judgment normal.     Results for orders placed or performed in visit on 11/13/16  CBC with Differential/Platelet  Result Value Ref Range   WBC 6.0 3.4 - 10.8 x10E3/uL   RBC 4.97 4.14 - 5.80 x10E6/uL   Hemoglobin 15.1 13.0 - 17.7 g/dL   Hematocrit 44.1 37.5 - 51.0 %   MCV 89 79 - 97 fL   MCH 30.4 26.6 - 33.0 pg   MCHC 34.2 31.5 - 35.7 g/dL   RDW 13.5 12.3 - 15.4 %   Platelets 221 150 - 379 x10E3/uL   Neutrophils 44 Not Estab. %   Lymphs 35 Not Estab. %   Monocytes 6 Not Estab. %   Eos 14 Not Estab. %   Basos 1 Not Estab. %   Neutrophils Absolute 2.6 1.4 - 7.0 x10E3/uL   Lymphocytes Absolute 2.1 0.7 - 3.1 x10E3/uL   Monocytes Absolute 0.4 0.1 - 0.9 x10E3/uL   EOS (ABSOLUTE) 0.9 (H) 0.0 - 0.4 x10E3/uL   Basophils Absolute 0.1 0.0 - 0.2 x10E3/uL   Immature Granulocytes 0 Not Estab. %   Immature Grans (Abs) 0.0 0.0 - 0.1 x10E3/uL  Comprehensive metabolic panel  Result Value Ref Range   Glucose 97 65 - 99 mg/dL   BUN 12 8 - 27 mg/dL   Creatinine, Ser 0.89 0.76 - 1.27 mg/dL   GFR calc non Af Amer 90 >59 mL/min/1.73   GFR calc Af Amer 104 >59 mL/min/1.73   BUN/Creatinine Ratio 13 10 - 24   Sodium 142 134 - 144 mmol/L   Potassium 4.2 3.5 - 5.2 mmol/L   Chloride 102 96 - 106  mmol/L   CO2 22 18 - 29 mmol/L   Calcium 9.6 8.6 - 10.2 mg/dL   Total Protein 7.9 6.0 - 8.5 g/dL   Albumin  4.9 (H) 3.6 - 4.8 g/dL   Globulin, Total 3.0 1.5 - 4.5 g/dL   Albumin/Globulin Ratio 1.6 1.2 - 2.2   Bilirubin Total 0.7 0.0 - 1.2 mg/dL   Alkaline Phosphatase 115 39 - 117 IU/L   AST 30 0 - 40 IU/L   ALT 29 0 - 44 IU/L  Lipid Panel w/o Chol/HDL Ratio  Result Value Ref Range   Cholesterol, Total 239 (H) 100 - 199 mg/dL   Triglycerides 161224 (H) 0 - 149 mg/dL   HDL 41 >09>39 mg/dL   VLDL Cholesterol Cal 45 (H) 5 - 40 mg/dL   LDL Calculated 604153 (H) 0 - 99 mg/dL  PSA  Result Value Ref Range   Prostate Specific Ag, Serum 0.7 0.0 - 4.0 ng/mL  TSH  Result Value Ref Range   TSH 2.410 0.450 - 4.500 uIU/mL  Hepatitis C antibody  Result Value Ref Range   Hep C Virus Ab <0.1 0.0 - 0.9 s/co ratio  HIV antibody  Result Value Ref Range   HIV Screen 4th Generation wRfx Non Reactive Non Reactive      Assessment & Plan:   Problem List Items Addressed This Visit      Other   Lumbago    Chronic, ongoing.  Continue physical therapy.  Continue current medication regimen, add Robaxin for occasional day time use as will not make as tired and may offer benefit.  Return in 6 months for annual physical.        Relevant Medications   methocarbamol (ROBAXIN) 500 MG tablet       Follow up plan: Return in about 6 months (around 06/07/2019) for annual physical.

## 2018-12-06 NOTE — Assessment & Plan Note (Signed)
Chronic, ongoing.  Continue physical therapy.  Continue current medication regimen, add Robaxin for occasional day time use as will not make as tired and may offer benefit.  Return in 6 months for annual physical.

## 2019-03-29 ENCOUNTER — Encounter: Payer: Self-pay | Admitting: Nurse Practitioner

## 2019-03-29 ENCOUNTER — Other Ambulatory Visit: Payer: Self-pay

## 2019-03-29 ENCOUNTER — Ambulatory Visit (INDEPENDENT_AMBULATORY_CARE_PROVIDER_SITE_OTHER): Payer: Worker's Compensation | Admitting: Nurse Practitioner

## 2019-03-29 VITALS — BP 134/77 | HR 73 | Temp 98.3°F | Ht 66.0 in | Wt 198.0 lb

## 2019-03-29 DIAGNOSIS — G8929 Other chronic pain: Secondary | ICD-10-CM

## 2019-03-29 DIAGNOSIS — M545 Low back pain, unspecified: Secondary | ICD-10-CM

## 2019-03-29 DIAGNOSIS — Z23 Encounter for immunization: Secondary | ICD-10-CM

## 2019-03-29 MED ORDER — PREDNISONE 10 MG PO TABS: ORAL_TABLET | ORAL | 0 refills | Status: DC

## 2019-03-29 MED ORDER — CYCLOBENZAPRINE HCL 10 MG PO TABS: 10.0000 mg | ORAL_TABLET | Freq: Three times a day (TID) | ORAL | 1 refills | Status: DC | PRN

## 2019-03-29 MED ORDER — METHOCARBAMOL 500 MG PO TABS: 500.0000 mg | ORAL_TABLET | ORAL | 2 refills | Status: DC | PRN

## 2019-03-29 NOTE — Assessment & Plan Note (Addendum)
Chronic at baseline, which had improved, recent return with acute pain.  Refill on Flexeril and Robaxin + script for Prednisone, as is traveling soon to [place].  Will transition to Tizanidine next visit.  Recommend alternating heat/ice + placing Voltaren gel on back at home.  Continue to rest and avoid lifting until improved.  Return for worsening or continued.

## 2019-03-29 NOTE — Progress Notes (Signed)
BP 134/77   Pulse 73   Temp 98.3 F (36.8 C) (Oral)   Ht 5\' 6"  (1.676 m)   Wt 198 lb (89.8 kg)   SpO2 97%   BMI 31.96 kg/m    Subjective:    Patient ID: Dale Morton, male    DOB: 1950-06-10, 68 y.o.   MRN: [id number]  HPI: Dale Morton is a 68 y.o. male  Chief Complaint  Patient presents with  . Back Pain    lower. x 4 days. patient states tha tpain started while he doing some yard work bending down picking up some tomatoes   BACK PAIN (ACUTE ON CHRONIC) Is a chronic, ongoing issue since work accident in 1988 or 1989. During accident he was pulling a deck and it got broken, then pulled back during movement + has h/o falling from height during work. Had been attending physical therapy, which he says did improve pain.  He completed this 2 months ago.  Recently, 6 days ago, was picking up crate of tomatoes for 20 minutes and pulled lower back.  Has been using Flexeril and Meloxicam at home. Has been using his Flexeril, which is helping.  Is taking one during daytime and night time.  Reports he accidentally threw his Robaxin script away and has not taken in awhile.  Is headed to [place] next week for 2 months to see his family. Duration:months Mechanism of injury:work accident (chronic) and lifting (acute) Location:low back Onset:chronic Severity:5/10at worst and at best 2/10  Quality:dull and burning across lower back Frequency:intermittent Radiation:none Aggravating factors:lifting and prolonged sitting Alleviating factors:  muscle relaxer Status:fluctuating Treatments attempted:Flexeril Relief with NSAIDs?:moderate Nighttime pain:no Paresthesias / decreased sensation:no Bowel / bladder incontinence:no Fevers:no Dysuria / urinary frequency:no  Relevant past medical, surgical, family and social history reviewed and updated as indicated. Interim medical history since our last visit reviewed. Allergies and medications reviewed and updated.   Review of Systems  Constitutional: Negative for activity change, diaphoresis, fatigue and fever.  Respiratory: Negative for cough, chest tightness, shortness of breath and wheezing.   Cardiovascular: Negative for chest pain, palpitations and leg swelling.  Gastrointestinal: Negative for abdominal distention, abdominal pain, constipation, diarrhea, nausea and vomiting.  Musculoskeletal: Positive for back pain.  Skin: Negative.   Neurological: Negative for dizziness, syncope, weakness, light-headedness, numbness and headaches.  Psychiatric/Behavioral: Negative.     Per HPI unless specifically indicated above     Objective:    BP 134/77   Pulse 73   Temp 98.3 F (36.8 C) (Oral)   Ht 5\' 6"  (1.676 m)   Wt 198 lb (89.8 kg)   SpO2 97%   BMI 31.96 kg/m   Wt Readings from Last 3 Encounters:  03/29/19 198 lb (89.8 kg)  09/05/18 196 lb (88.9 kg)  04/01/18 197 lb 12.8 oz (89.7 kg)    Physical Exam Vitals signs and nursing note reviewed.  Constitutional:      General: He is not in acute distress.    Appearance: He is well-developed. He is not ill-appearing.  HENT:     Head: Normocephalic and atraumatic.     Right Ear: Hearing normal. No drainage.     Left Ear: Hearing normal. No drainage.     Mouth/Throat:     Pharynx: Uvula midline.  Eyes:     General: Lids are normal.        Right eye: No discharge.        Left eye: No discharge.     Conjunctiva/sclera:  Conjunctivae normal.     Pupils: Pupils are equal, round, and reactive to light.  Neck:     Musculoskeletal: Normal range of motion and neck supple.     Thyroid: No thyromegaly.     Vascular: No carotid bruit or JVD.     Trachea: Trachea normal.  Cardiovascular:     Rate and Rhythm: Normal rate and regular rhythm.     Heart sounds: Normal heart sounds, S1 normal and S2 normal. No murmur. No gallop.   Pulmonary:     Effort: Pulmonary effort is normal. No accessory muscle usage or respiratory distress.     Breath sounds:  Normal breath sounds.  Abdominal:     General: Bowel sounds are normal.     Palpations: Abdomen is soft. There is no hepatomegaly or splenomegaly.  Musculoskeletal:     Lumbar back: He exhibits decreased range of motion, tenderness and pain. He exhibits no swelling, no edema and no laceration.     Right lower leg: No edema.     Left lower leg: No edema.     Comments: Mild tenderness on palpation lower back.  Decreased flexion/extension/lateral ROM.  Negative straight leg.  Skin:    General: Skin is warm and dry.     Capillary Refill: Capillary refill takes less than 2 seconds.     Findings: No rash.  Neurological:     Mental Status: He is alert and oriented to person, place, and time.     Deep Tendon Reflexes: Reflexes are normal and symmetric.  Psychiatric:        Mood and Affect: Mood normal.        Behavior: Behavior normal.        Thought Content: Thought content normal.        Judgment: Judgment normal.     Results for orders placed or performed in visit on 11/13/16  CBC with Differential/Platelet  Result Value Ref Range   WBC 6.0 3.4 - 10.8 x10E3/uL   RBC 4.97 4.14 - 5.80 x10E6/uL   Hemoglobin 15.1 13.0 - 17.7 g/dL   Hematocrit 81.144.1 91.437.5 - 51.0 %   MCV 89 79 - 97 fL   MCH 30.4 26.6 - 33.0 pg   MCHC 34.2 31.5 - 35.7 g/dL   RDW 78.213.5 95.612.3 - 21.315.4 %   Platelets 221 150 - 379 x10E3/uL   Neutrophils 44 Not Estab. %   Lymphs 35 Not Estab. %   Monocytes 6 Not Estab. %   Eos 14 Not Estab. %   Basos 1 Not Estab. %   Neutrophils Absolute 2.6 1.4 - 7.0 x10E3/uL   Lymphocytes Absolute 2.1 0.7 - 3.1 x10E3/uL   Monocytes Absolute 0.4 0.1 - 0.9 x10E3/uL   EOS (ABSOLUTE) 0.9 (H) 0.0 - 0.4 x10E3/uL   Basophils Absolute 0.1 0.0 - 0.2 x10E3/uL   Immature Granulocytes 0 Not Estab. %   Immature Grans (Abs) 0.0 0.0 - 0.1 x10E3/uL  Comprehensive metabolic panel  Result Value Ref Range   Glucose 97 65 - 99 mg/dL   BUN 12 8 - 27 mg/dL   Creatinine, Ser 0.860.89 0.76 - 1.27 mg/dL   GFR  calc non Af Amer 90 >59 mL/min/1.73   GFR calc Af Amer 104 >59 mL/min/1.73   BUN/Creatinine Ratio 13 10 - 24   Sodium 142 134 - 144 mmol/L   Potassium 4.2 3.5 - 5.2 mmol/L   Chloride 102 96 - 106 mmol/L   CO2 22 18 - 29 mmol/L   Calcium  9.6 8.6 - 10.2 mg/dL   Total Protein 7.9 6.0 - 8.5 g/dL   Albumin 4.9 (H) 3.6 - 4.8 g/dL   Globulin, Total 3.0 1.5 - 4.5 g/dL   Albumin/Globulin Ratio 1.6 1.2 - 2.2   Bilirubin Total 0.7 0.0 - 1.2 mg/dL   Alkaline Phosphatase 115 39 - 117 IU/L   AST 30 0 - 40 IU/L   ALT 29 0 - 44 IU/L  Lipid Panel w/o Chol/HDL Ratio  Result Value Ref Range   Cholesterol, Total 239 (H) 100 - 199 mg/dL   Triglycerides 315 (H) 0 - 149 mg/dL   HDL 41 >94 mg/dL   VLDL Cholesterol Cal 45 (H) 5 - 40 mg/dL   LDL Calculated 585 (H) 0 - 99 mg/dL  PSA  Result Value Ref Range   Prostate Specific Ag, Serum 0.7 0.0 - 4.0 ng/mL  TSH  Result Value Ref Range   TSH 2.410 0.450 - 4.500 uIU/mL  Hepatitis C antibody  Result Value Ref Range   Hep C Virus Ab <0.1 0.0 - 0.9 s/co ratio  HIV antibody  Result Value Ref Range   HIV Screen 4th Generation wRfx Non Reactive Non Reactive      Assessment & Plan:   Problem List Items Addressed This Visit      Other   Low back pain    Chronic at baseline, which had improved, recent return with acute pain.  Refill on Flexeril and Robaxin + script for Prednisone, as is traveling soon to [place].  Will transition to Tizanidine next visit.  Recommend alternating heat/ice + placing Voltaren gel on back at home.  Continue to rest and avoid lifting until improved.  Return for worsening or continued.      Relevant Medications   methocarbamol (ROBAXIN) 500 MG tablet   cyclobenzaprine (FLEXERIL) 10 MG tablet   predniSONE (DELTASONE) 10 MG tablet    Other Visit Diagnoses    Flu vaccine need    -  Primary   Relevant Orders   Flu Vaccine QUAD High Dose(Fluad) (Completed)   Need for pneumococcal vaccine       Relevant Orders   Pneumococcal  polysaccharide vaccine 23-valent greater than or equal to 2yo subcutaneous/IM (Completed)       Follow up plan: Return if symptoms worsen or fail to improve.

## 2019-03-29 NOTE — Patient Instructions (Signed)

## 2019-04-03 DIAGNOSIS — Z03818 Encounter for observation for suspected exposure to other biological agents ruled out: Secondary | ICD-10-CM | POA: Diagnosis not present

## 2019-04-03 DIAGNOSIS — Z20828 Contact with and (suspected) exposure to other viral communicable diseases: Secondary | ICD-10-CM | POA: Diagnosis not present

## 2019-04-13 ENCOUNTER — Other Ambulatory Visit: Payer: Self-pay

## 2019-04-13 DIAGNOSIS — Z20822 Contact with and (suspected) exposure to covid-19: Secondary | ICD-10-CM

## 2019-04-14 LAB — NOVEL CORONAVIRUS, NAA: SARS-CoV-2, NAA: NOT DETECTED

## 2019-06-07 ENCOUNTER — Ambulatory Visit: Payer: Medicare Other | Admitting: Nurse Practitioner

## 2019-07-31 ENCOUNTER — Ambulatory Visit: Payer: Medicare Other | Attending: Internal Medicine

## 2019-07-31 DIAGNOSIS — Z23 Encounter for immunization: Secondary | ICD-10-CM

## 2019-07-31 NOTE — Progress Notes (Signed)
   Covid-19 Vaccination Clinic  Name:  CHRISS MANNAN    MRN: [id number] DOB: 23-May-1951  07/31/2019  Mr. Burright was observed post Covid-19 immunization for 15 minutes without incidence. He was provided with Vaccine Information Sheet and instruction to access the V-Safe system.   Mr. Vuncannon was instructed to call 911 with any severe reactions post vaccine: [name] Difficulty breathing  . Swelling of your face and throat  . A fast heartbeat  . A bad rash all over your body  . Dizziness and weakness    Immunizations Administered    Name Date Dose VIS Date Route   Moderna COVID-19 Vaccine 07/31/2019  1:47 PM 0.5 mL 05/09/2019 Intramuscular   Manufacturer: Moderna   Lot: [id number]   NDC: 09198-022-17

## 2019-08-07 ENCOUNTER — Telehealth: Payer: Self-pay | Admitting: Nurse Practitioner

## 2019-08-07 NOTE — Telephone Encounter (Signed)
Pt has questions about bill he recieved from [place]. I Put copy of bill on door.

## 2019-08-18 ENCOUNTER — Other Ambulatory Visit: Payer: Self-pay

## 2019-08-18 ENCOUNTER — Encounter: Payer: Self-pay | Admitting: Nurse Practitioner

## 2019-08-18 ENCOUNTER — Ambulatory Visit (INDEPENDENT_AMBULATORY_CARE_PROVIDER_SITE_OTHER): Payer: Worker's Compensation | Admitting: Nurse Practitioner

## 2019-08-18 DIAGNOSIS — M5442 Lumbago with sciatica, left side: Secondary | ICD-10-CM | POA: Diagnosis not present

## 2019-08-18 DIAGNOSIS — M5441 Lumbago with sciatica, right side: Secondary | ICD-10-CM

## 2019-08-18 DIAGNOSIS — G8929 Other chronic pain: Secondary | ICD-10-CM | POA: Diagnosis not present

## 2019-08-18 MED ORDER — CYCLOBENZAPRINE HCL 10 MG PO TABS: 10.0000 mg | ORAL_TABLET | Freq: Three times a day (TID) | ORAL | 1 refills | Status: DC | PRN

## 2019-08-18 MED ORDER — MELOXICAM 15 MG PO TABS: 15.0000 mg | ORAL_TABLET | Freq: Every day | ORAL | 1 refills | Status: DC

## 2019-08-18 MED ORDER — METHOCARBAMOL 500 MG PO TABS: 500.0000 mg | ORAL_TABLET | ORAL | 2 refills | Status: DC | PRN

## 2019-08-18 MED ORDER — PREDNISONE 10 MG PO TABS: ORAL_TABLET | ORAL | 0 refills | Status: DC

## 2019-08-18 NOTE — Progress Notes (Signed)
BP 125/69   Pulse 80   Temp 98.1 F (36.7 C) (Oral)   Ht 5\' 6"  (1.676 m)   Wt 196 lb (88.9 kg)   SpO2 96%   BMI 31.64 kg/m    Subjective:    Patient ID: Dale Morton, male    DOB: [birth year], 69 y.o.   MRN: [id number]  HPI: Dale Morton is a 69 y.o. male  Chief Complaint  Patient presents with  . Back Pain   BACK PAIN (ACUTE ON CHRONIC) Is a chronic, ongoing issue since work accident in 1988 or 1989. During accident he was pulling a deck and it got broken, then pulled back during movement + has h/o falling from height during work.Did go to physical therapy for period of time, which was beneficial.  Has been using Flexeril and Meloxicam at home. Has been using his Flexeril, which is helping.  Is taking one during daytime and night time.  Also takes Robaxin as needed.  When he is working he wears back support.    Presently is having increased back pain, started 3 days ago in lower back and into SI joints.  Is taking medications for this as noted above, which is helping.  He is able to sleep.  In past has taken Prednisone for acute pain and this has been beneficial. Duration:months Mechanism of injury:work accident (chronic) and recent lifting (acute) Location:low back Onset:chronic Severity:5/10at worst and at best 2/10  Quality:dull and burning across lower back Frequency:intermittent Radiation:none Aggravating factors:lifting and prolonged sitting Alleviating factors:  muscle relaxer Status:fluctuating Treatments attempted:Flexeril Relief with NSAIDs?:moderate Nighttime pain:no Paresthesias / decreased sensation:no Bowel / bladder incontinence:no Fevers:no Dysuria / urinary frequency:no  Relevant past medical, surgical, family and social history reviewed and updated as indicated. Interim medical history since our last visit reviewed. Allergies and medications reviewed and updated.  Review of Systems  Constitutional: Negative for activity  change, diaphoresis, fatigue and fever.  Respiratory: Negative for cough, chest tightness, shortness of breath and wheezing.   Cardiovascular: Negative for chest pain, palpitations and leg swelling.  Gastrointestinal: Negative.   Musculoskeletal: Positive for back pain.  Skin: Negative.   Neurological: Negative.   Psychiatric/Behavioral: Negative.     Per HPI unless specifically indicated above     Objective:    BP 125/69   Pulse 80   Temp 98.1 F (36.7 C) (Oral)   Ht 5\' 6"  (1.676 m)   Wt 196 lb (88.9 kg)   SpO2 96%   BMI 31.64 kg/m   Wt Readings from Last 3 Encounters:  08/18/19 196 lb (88.9 kg)  03/29/19 198 lb (89.8 kg)  09/05/18 196 lb (88.9 kg)    Physical Exam Vitals and nursing note reviewed.  Constitutional:      General: He is not in acute distress.    Appearance: He is well-developed. He is not ill-appearing.  HENT:     Head: Normocephalic and atraumatic.     Right Ear: Hearing normal. No drainage.     Left Ear: Hearing normal. No drainage.     Mouth/Throat:     Pharynx: Uvula midline.  Eyes:     General: Lids are normal.        Right eye: No discharge.        Left eye: No discharge.     Conjunctiva/sclera: Conjunctivae normal.     Pupils: Pupils are equal, round, and reactive to light.  Neck:     Thyroid: No thyromegaly.     Vascular:  No carotid bruit or JVD.     Trachea: Trachea normal.  Cardiovascular:     Rate and Rhythm: Normal rate and regular rhythm.     Heart sounds: Normal heart sounds, S1 normal and S2 normal. No murmur. No gallop.   Pulmonary:     Effort: Pulmonary effort is normal. No accessory muscle usage or respiratory distress.     Breath sounds: Normal breath sounds.  Abdominal:     General: Bowel sounds are normal.     Palpations: Abdomen is soft. There is no hepatomegaly or splenomegaly.  Musculoskeletal:     Cervical back: Normal range of motion and neck supple.     Lumbar back: Tenderness present. No swelling, edema or  lacerations. Decreased range of motion.     Right lower leg: No edema.     Left lower leg: No edema.     Comments: Mild tenderness on palpation lower back.  Decreased flexion/extension/lateral ROM.  Negative straight leg.  Skin:    General: Skin is warm and dry.     Capillary Refill: Capillary refill takes less than 2 seconds.     Findings: No rash.  Neurological:     Mental Status: He is alert and oriented to person, place, and time.     Deep Tendon Reflexes: Reflexes are normal and symmetric.  Psychiatric:        Mood and Affect: Mood normal.        Behavior: Behavior normal.        Thought Content: Thought content normal.        Judgment: Judgment normal.     Results for orders placed or performed in visit on 04/13/19  Novel Coronavirus, NAA ([facility])   Specimen: Nasopharyngeal(NP) swabs in vial transport medium   NASOPHARYNGE  TESTING  Result Value Ref Range   SARS-CoV-2, NAA Not Detected Not Detected      Assessment & Plan:   Problem List Items Addressed This Visit      Other   Low back pain    Chronic at baseline, recent with acute pain.  Refill on Flexeril and Robaxin + script for Prednisone for acute flare.  Will transition to Tizanidine next visit.  Recommend alternating heat/ice + placing Voltaren gel on back at home.  Continue to rest and avoid lifting until improved.  Return in 6 months.  He refuses physical or labs due to cost, reports he had labs [place] in [place] recently and forgot to bring them today, but they were all good.      Relevant Medications   meloxicam (MOBIC) 15 MG tablet   cyclobenzaprine (FLEXERIL) 10 MG tablet   methocarbamol (ROBAXIN) 500 MG tablet   predniSONE (DELTASONE) 10 MG tablet       Follow up plan: Return in about 6 months (around 02/18/2020) for Follow-up.

## 2019-08-18 NOTE — Patient Instructions (Signed)
Dolor de espalda crnico Chronic Back Pain Cuando el dolor de espalda dura ms de 3 meses se denomina dolor de espalda crnico. Es posible que se desconozca la causa de su dolor de espalda. Algunas causas frecuentes son las siguientes:  Desgaste (enfermedad degenerativa) de los huesos, los ligamentos o los discos de la espalda.  Inflamacin y rigidez en la espalda (artritis). Las personas que sufren dolor de espalda crnico generalmente atraviesan determinados perodos en los que este es ms intenso (episodios de exacerbacin del dolor). Muchas personas pueden aprender a controlar el dolor de espalda con el cuidado en el hogar. Sigue estas instrucciones en tu casa: Est atento a cualquier cambio en los sntomas. Tome estas medidas para aliviar el dolor: Actividad   Evite agacharse y realizar otras actividades que agraven el problema.  Mantenga una postura correcta mientras est de pie o sentado: ? Cuando est de pie, mantenga la parte alta de la espalda y el cuello rectos, con los hombros hacia atrs. Evite encorvarse. ? Cuando est sentado, mantenga la espalda recta y relaje los hombros. No curve los hombros ni los eche hacia atrs.  No permanezca sentado o de pie en el mismo lugar durante mucho tiempo.  Durante el da, descanse durante lapsos breves. Esto le aliviar el dolor. Descansar recostado o de pie suele ser mejor que hacerlo sentado.  Cuando descanse durante perodos ms largos, incorpore alguna actividad suave o ejercicios de elongacin entre uno y otro. Esto ayudar a evitar la rigidez y el dolor.  Hacer ejercicio con regularidad. Pregntele al mdico qu actividades son seguras para usted.  No levante ningn objeto que pese ms de 10libras (4.5kg). Siempre use las tcnicas correctas para levantar objetos, entre ellas: ? Flexionar las rodillas. ? Mantener la carga cerca del cuerpo. ? No torcerse.  Duerma sobre un colchn firme en una posicin cmoda. Intente acostarse de  costado, con las rodillas ligeramente flexionadas. Si se recuesta sobre la espalda, coloque una almohada debajo de las rodillas. Control del dolor  Si se lo indican, aplique hielo sobre la zona dolorida. El mdico puede recomendarle que se aplique hielo durante las primeras 24a 48horas despus del comienzo de un episodio de exacerbacin del dolor. ? Ponga el hielo en una bolsa plstica. ? Coloque una toalla entre la piel y la bolsa. ? Coloque el hielo durante 20minutos, 2a3veces al da.  Si se lo indican, aplique calor en la zona afectada con la frecuencia que le haya indicado el mdico. Use la fuente de calor que el mdico le recomiende, como una compresa de calor hmedo o una almohadilla trmica. ? Colquese una toalla entre la piel y la fuente de calor. ? Aplique calor durante 20 a 30minutos. ? Retire la fuente de calor si la piel se pone de color rojo brillante. Esto es especialmente importante si no puede sentir dolor, calor o fro. Puede correr un riesgo mayor de sufrir quemaduras.  Intente tomar un bao de inmersin con agua caliente.  Toma los medicamentos de venta libre y los recetados solamente como se lo haya indicado el mdico.  Concurrir a todas las visitas de seguimiento como se lo haya indicado el mdico. Esto es importante. Comuncate con un mdico si:  Siente un dolor que no se alivia con reposo o medicamentos. Solicite ayuda inmediatamente si:  Siente debilidad o adormecimiento en una o ambas piernas, o en uno o ambos pies.  Tiene dificultad para controlar la miccin o la defecacin.  Tiene nuseas o vmitos.  Tiene dolor   en el abdomen.  Le falta el aire o se desmaya. Esta informacin no tiene como fin reemplazar el consejo del mdico. Asegrese de hacerle al mdico cualquier pregunta que tenga. Document Revised: 05/27/2018 Document Reviewed: 05/27/2018 Elsevier Patient Education  2020 Elsevier Inc.  

## 2019-08-18 NOTE — Assessment & Plan Note (Signed)
Chronic at baseline, recent with acute pain.  Refill on Flexeril and Robaxin + script for Prednisone for acute flare.  Will transition to Tizanidine next visit.  Recommend alternating heat/ice + placing Voltaren gel on back at home.  Continue to rest and avoid lifting until improved.  Return in 6 months.  He refuses physical or labs due to cost, reports he had labs done in [place] recently and forgot to bring them today, but they were all good.

## 2019-08-29 ENCOUNTER — Ambulatory Visit: Payer: Medicare Other | Attending: Internal Medicine

## 2019-08-29 DIAGNOSIS — Z23 Encounter for immunization: Secondary | ICD-10-CM

## 2019-08-29 NOTE — Progress Notes (Signed)
   Covid-19 Vaccination Clinic  Name:  ADONTE VANRIPER    MRN: [id number] DOB: 1950-06-19  08/29/2019  Mr. Mcgrory was observed post Covid-19 immunization for 15 minutes without incident. He was provided with Vaccine Information Sheet and instruction to access the V-Safe system.   Mr. Corona was instructed to call 911 with any severe reactions post vaccine: [name] Difficulty breathing  . Swelling of face and throat  . A fast heartbeat  . A bad rash all over body  . Dizziness and weakness   Immunizations Administered    Name Date Dose VIS Date Route   Moderna COVID-19 Vaccine 08/29/2019  1:11 PM 0.5 mL 05/09/2019 Intramuscular   Manufacturer: Gala Murdoch   Lot: [id number]   NDC: 56701-410-30

## 2019-09-05 ENCOUNTER — Telehealth: Payer: Self-pay | Admitting: Nurse Practitioner

## 2019-09-05 NOTE — Telephone Encounter (Signed)
Pt is here in office stating he got a bill from a service that was provided in our office,Pt has questions as to why he has bill, due to it being workers comp. Please advise,I have left copy of bill on the mail box on door.

## 2019-10-09 NOTE — Telephone Encounter (Signed)
Pt came in to check on status of this, Pt stated he is still receiving bill.

## 2020-02-19 ENCOUNTER — Encounter: Payer: Self-pay | Admitting: Nurse Practitioner

## 2020-02-19 ENCOUNTER — Ambulatory Visit (INDEPENDENT_AMBULATORY_CARE_PROVIDER_SITE_OTHER): Payer: Medicare Other | Admitting: Nurse Practitioner

## 2020-02-19 ENCOUNTER — Ambulatory Visit (INDEPENDENT_AMBULATORY_CARE_PROVIDER_SITE_OTHER): Payer: Worker's Compensation | Admitting: Nurse Practitioner

## 2020-02-19 ENCOUNTER — Other Ambulatory Visit: Payer: Self-pay

## 2020-02-19 DIAGNOSIS — M5442 Lumbago with sciatica, left side: Secondary | ICD-10-CM

## 2020-02-19 DIAGNOSIS — Z23 Encounter for immunization: Secondary | ICD-10-CM | POA: Diagnosis not present

## 2020-02-19 DIAGNOSIS — M5441 Lumbago with sciatica, right side: Secondary | ICD-10-CM | POA: Diagnosis not present

## 2020-02-19 DIAGNOSIS — G8929 Other chronic pain: Secondary | ICD-10-CM

## 2020-02-19 MED ORDER — MELOXICAM 15 MG PO TABS: 15.0000 mg | ORAL_TABLET | Freq: Every day | ORAL | 1 refills | Status: DC

## 2020-02-19 MED ORDER — CYCLOBENZAPRINE HCL 10 MG PO TABS: 10.0000 mg | ORAL_TABLET | Freq: Three times a day (TID) | ORAL | 4 refills | Status: DC | PRN

## 2020-02-19 MED ORDER — METHOCARBAMOL 500 MG PO TABS: 500.0000 mg | ORAL_TABLET | ORAL | 2 refills | Status: DC | PRN

## 2020-02-19 NOTE — Assessment & Plan Note (Signed)
Chronic at baseline.  Refill on Flexeril and Robaxin.  Will transition to Tizanidine next visit due to age.  Recommend alternating heat/ice + placing Voltaren gel on back at home.  Continue to rest and avoid lifting until improved.  Return in 6 months.  He refuses physical or labs due to cost, reports he had labs done in [place] recently and forgot to bring them today, but they were all good.

## 2020-02-19 NOTE — Progress Notes (Signed)
BP 110/64 (BP Location: Left Arm, Patient Position: Sitting, Cuff Size: Normal)   Pulse 64   Temp 98.6 F (37 C) (Oral)   Resp 16   Ht 5\' 7"  (1.702 m)   Wt 198 lb (89.8 kg)   SpO2 96%   BMI 31.01 kg/m    Subjective:    Patient ID: Dale Morton, male    DOB: [birth year], 69 y.o.   MRN: [id number]  HPI: Dale Morton is a 69 y.o. male  Chief Complaint  Patient presents with  . Follow-up   BACK PAIN (ACUTE ON CHRONIC) Is a chronic, ongoing issue since work accident in 1988 or 1989. During accident he was pulling a deck and it got broken, then pulled back during movement + has h/o falling from height during work.Did go to physical therapy for period of time, which was beneficial.  Has been using Flexeril and Meloxicam at home.  Is taking one during daytime and one at night time.  Also takes Robaxin as needed.  When he is working he wears back support.   Duration:months Mechanism of injury:work accident (chronic) and recent lifting (acute) Location:low back Onset:chronic Severity:5/10at worst and at best 2/10  Quality:dull and burning across lower back Frequency:intermittent Radiation:none Aggravating factors:lifting and prolonged sitting Alleviating factors:  muscle relaxer Status:fluctuating Treatments attempted:Flexeril Relief with NSAIDs?:moderate Nighttime pain:no Paresthesias / decreased sensation:no Bowel / bladder incontinence:no Fevers:no Dysuria / urinary frequency:no  Relevant past medical, surgical, family and social history reviewed and updated as indicated. Interim medical history since our last visit reviewed. Allergies and medications reviewed and updated.  Review of Systems  Constitutional: Negative for activity change, diaphoresis, fatigue and fever.  Respiratory: Negative for cough, chest tightness, shortness of breath and wheezing.   Cardiovascular: Negative for chest pain, palpitations and leg swelling.  Gastrointestinal:  Negative.   Musculoskeletal: Positive for back pain.  Skin: Negative.   Neurological: Negative.   Psychiatric/Behavioral: Negative.     Per HPI unless specifically indicated above     Objective:    BP 110/64 (BP Location: Left Arm, Patient Position: Sitting, Cuff Size: Normal)   Pulse 64   Temp 98.6 F (37 C) (Oral)   Resp 16   Ht 5\' 7"  (1.702 m)   Wt 198 lb (89.8 kg)   SpO2 96%   BMI 31.01 kg/m   Wt Readings from Last 3 Encounters:  02/19/20 198 lb (89.8 kg)  08/18/19 196 lb (88.9 kg)  03/29/19 198 lb (89.8 kg)    Physical Exam Vitals and nursing note reviewed.  Constitutional:      General: He is not in acute distress.    Appearance: He is well-developed. He is not ill-appearing.  HENT:     Head: Normocephalic and atraumatic.     Right Ear: Hearing normal. No drainage.     Left Ear: Hearing normal. No drainage.     Mouth/Throat:     Pharynx: Uvula midline.  Eyes:     General: Lids are normal.        Right eye: No discharge.        Left eye: No discharge.     Conjunctiva/sclera: Conjunctivae normal.     Pupils: Pupils are equal, round, and reactive to light.  Neck:     Thyroid: No thyromegaly.     Vascular: No carotid bruit or JVD.     Trachea: Trachea normal.  Cardiovascular:     Rate and Rhythm: Normal rate and regular rhythm.  Heart sounds: Normal heart sounds, S1 normal and S2 normal. No murmur heard.  No gallop.   Pulmonary:     Effort: Pulmonary effort is normal. No accessory muscle usage or respiratory distress.     Breath sounds: Normal breath sounds.  Abdominal:     General: Bowel sounds are normal.     Palpations: Abdomen is soft. There is no hepatomegaly or splenomegaly.  Musculoskeletal:     Cervical back: Normal range of motion and neck supple.     Lumbar back: Tenderness present. No swelling, edema or lacerations. Decreased range of motion.     Right lower leg: No edema.     Left lower leg: No edema.     Comments: Mild tenderness on  palpation lower back.  Decreased flexion/extension/lateral ROM.  Negative straight leg.  Skin:    General: Skin is warm and dry.     Capillary Refill: Capillary refill takes less than 2 seconds.     Findings: No rash.  Neurological:     Mental Status: He is alert and oriented to person, place, and time.     Deep Tendon Reflexes: Reflexes are normal and symmetric.  Psychiatric:        Mood and Affect: Mood normal.        Behavior: Behavior normal.        Thought Content: Thought content normal.        Judgment: Judgment normal.     Results for orders placed or performed in visit on 04/13/19  Novel Coronavirus, NAA ([facility])   Specimen: Nasopharyngeal(NP) swabs in vial transport medium   NASOPHARYNGE  TESTING  Result Value Ref Range   SARS-CoV-2, NAA Not Detected Not Detected      Assessment & Plan:   Problem List Items Addressed This Visit      Other   Low back pain    Chronic at baseline.  Refill on Flexeril and Robaxin.  Will transition to Tizanidine next visit due to age.  Recommend alternating heat/ice + placing Voltaren gel on back at home.  Continue to rest and avoid lifting until improved.  Return in 6 months.  He refuses physical or labs due to cost, reports [place] recently and forgot to bring them today, but they were all good.      Relevant Medications   cyclobenzaprine (FLEXERIL) 10 MG tablet   methocarbamol (ROBAXIN) 500 MG tablet   meloxicam (MOBIC) 15 MG tablet       Follow up plan: Return in about 5 months (around 07/21/2020) for Initial Medicare Wellness -- does not look like he has had one please check to ensure covered.

## 2020-02-19 NOTE — Patient Instructions (Signed)
Dolor de espalda crnico Chronic Back Pain Cuando el dolor de espalda dura ms de 3 meses se denomina dolor de espalda crnico. Es posible que se desconozca la causa de su dolor de espalda. Algunas causas frecuentes son las siguientes:  Desgaste (enfermedad degenerativa) de los huesos, los ligamentos o los discos de la espalda.  Inflamacin y rigidez en la espalda (artritis). Las personas que sufren dolor de espalda crnico generalmente atraviesan determinados perodos en los que este es ms intenso (episodios de exacerbacin del dolor). Muchas personas pueden aprender a controlar el dolor de espalda con el cuidado en el hogar. Sigue estas instrucciones en tu casa: Est atento a cualquier cambio en los sntomas. Tome estas medidas para aliviar el dolor: Actividad   Evite agacharse y realizar otras actividades que agraven el problema.  Mantenga una postura correcta mientras est de pie o sentado: ? Cuando est de pie, mantenga la parte alta de la espalda y el cuello rectos, con los hombros hacia atrs. Evite encorvarse. ? Cuando est sentado, mantenga la espalda recta y relaje los hombros. No curve los hombros ni los eche hacia atrs.  No permanezca sentado o de pie en el mismo lugar durante mucho tiempo.  Durante el da, descanse durante lapsos breves. Esto le aliviar el dolor. Descansar recostado o de pie suele ser mejor que hacerlo sentado.  Cuando descanse durante perodos ms largos, incorpore alguna actividad suave o ejercicios de elongacin entre uno y otro. Esto ayudar a evitar la rigidez y el dolor.  Hacer ejercicio con regularidad. Pregntele al mdico qu actividades son seguras para usted.  No levante ningn objeto que pese ms de 10libras (4.5kg). Siempre use las tcnicas correctas para levantar objetos, entre ellas: ? Flexionar las rodillas. ? Mantener la carga cerca del cuerpo. ? No torcerse.  Duerma sobre un colchn firme en una posicin cmoda. Intente acostarse de  costado, con las rodillas ligeramente flexionadas. Si se recuesta sobre la espalda, coloque una almohada debajo de las rodillas. Control del dolor  Si se lo indican, aplique hielo sobre la zona dolorida. El mdico puede recomendarle que se aplique hielo durante las primeras 24a 48horas despus del comienzo de un episodio de exacerbacin del dolor. ? Ponga el hielo en una bolsa plstica. ? Coloque una toalla entre la piel y la bolsa. ? Coloque el hielo durante 20minutos, 2a3veces al da.  Si se lo indican, aplique calor en la zona afectada con la frecuencia que le haya indicado el mdico. Use la fuente de calor que el mdico le recomiende, como una compresa de calor hmedo o una almohadilla trmica. ? Colquese una toalla entre la piel y la fuente de calor. ? Aplique calor durante 20 a 30minutos. ? Retire la fuente de calor si la piel se pone de color rojo brillante. Esto es especialmente importante si no puede sentir dolor, calor o fro. Puede correr un riesgo mayor de sufrir quemaduras.  Intente tomar un bao de inmersin con agua caliente.  Toma los medicamentos de venta libre y los recetados solamente como se lo haya indicado el mdico.  Concurrir a todas las visitas de seguimiento como se lo haya indicado el mdico. Esto es importante. Comuncate con un mdico si:  Siente un dolor que no se alivia con reposo o medicamentos. Solicite ayuda inmediatamente si:  Siente debilidad o adormecimiento en una o ambas piernas, o en uno o ambos pies.  Tiene dificultad para controlar la miccin o la defecacin.  Tiene nuseas o vmitos.  Tiene dolor   en el abdomen.  Le falta el aire o se desmaya. Esta informacin no tiene como fin reemplazar el consejo del mdico. Asegrese de hacerle al mdico cualquier pregunta que tenga. Document Revised: 05/27/2018 Document Reviewed: 05/27/2018 Elsevier Patient Education  2020 Elsevier Inc.  

## 2020-03-11 ENCOUNTER — Ambulatory Visit: Payer: Medicare Other

## 2020-03-21 ENCOUNTER — Telehealth: Payer: Self-pay | Admitting: Nurse Practitioner

## 2020-03-21 NOTE — Telephone Encounter (Signed)
Copied from CRM [id number]. Topic: Medicare AWV >> Mar 21, 2020 10:51 AM Claudette Laws R wrote: Reason for CRM:   No answer unable to leavemessage for patient to call back and schedule Medicare Annual Wellness Visit (AWV) to be done virtually.  No hx of AWVI as of 08/07/2018  Please schedule at anytime with [facility]urse Health Advisor.      45 Minutes appointment   Any questions, please call me at [phone]

## 2020-10-28 ENCOUNTER — Ambulatory Visit (INDEPENDENT_AMBULATORY_CARE_PROVIDER_SITE_OTHER): Payer: Worker's Compensation | Admitting: Nurse Practitioner

## 2020-10-28 ENCOUNTER — Other Ambulatory Visit: Payer: Self-pay

## 2020-10-28 ENCOUNTER — Encounter: Payer: Self-pay | Admitting: Nurse Practitioner

## 2020-10-28 VITALS — BP 124/78 | HR 74 | Temp 98.7°F | Wt 198.6 lb

## 2020-10-28 DIAGNOSIS — M5441 Lumbago with sciatica, right side: Secondary | ICD-10-CM

## 2020-10-28 DIAGNOSIS — G8929 Other chronic pain: Secondary | ICD-10-CM | POA: Diagnosis not present

## 2020-10-28 DIAGNOSIS — M5442 Lumbago with sciatica, left side: Secondary | ICD-10-CM

## 2020-10-28 MED ORDER — PREDNISONE 10 MG PO TABS: ORAL_TABLET | ORAL | 0 refills | Status: DC

## 2020-10-28 MED ORDER — CYCLOBENZAPRINE HCL 10 MG PO TABS: 10.0000 mg | ORAL_TABLET | Freq: Three times a day (TID) | ORAL | 4 refills | Status: DC | PRN

## 2020-10-28 MED ORDER — MELOXICAM 15 MG PO TABS: 15.0000 mg | ORAL_TABLET | Freq: Every day | ORAL | 3 refills | Status: DC

## 2020-10-28 MED ORDER — METHOCARBAMOL 500 MG PO TABS: 500.0000 mg | ORAL_TABLET | ORAL | 4 refills | Status: DC | PRN

## 2020-10-28 NOTE — Assessment & Plan Note (Signed)
Chronic at baseline with acute flare.  Refill on Flexeril and Robaxin.  Will transition to Tizanidine next visit due to age.  Recommend alternating heat/ice + placing Voltaren gel on back at home.  Continue to rest and avoid lifting until improved.  Prednisone taper sent.  Return in 6 months.  PT referral in place per patient request.

## 2020-10-28 NOTE — Patient Instructions (Signed)
Chronic Back Pain When back pain lasts longer than 3 months, it is called chronic back pain. Pain may get worse at certain times (flare-ups). There are things you can do at home to manage your pain. Follow these instructions at home: Pay attention to any changes in your symptoms. Take these actions to help with your pain: Managing pain and stiffness  If told, put ice on the painful area. Your doctor may tell you to use ice for 24-48 hours after the flare-up starts. To do this: ? Put ice in a plastic bag. ? Place a towel between your skin and the bag. ? Leave the ice on for 20 minutes, 2-3 times a day.  If told, put heat on the painful area. Do this as often as told by your doctor. Use the heat source that your doctor recommends, such as a moist heat pack or a heating pad. ? Place a towel between your skin and the heat source. ? Leave the heat on for 20-30 minutes. ? Take off the heat if your skin turns bright red. This is especially important if you are unable to feel pain, heat, or cold. You may have a greater risk of getting burned.  Soak in a warm bath. This can help relieve pain.      Activity  Avoid bending and other activities that make pain worse.  When standing: ? Keep your upper back and neck straight. ? Keep your shoulders pulled back. ? Avoid slouching.  When sitting: ? Keep your back straight. ? Relax your shoulders. Do not round your shoulders or pull them backward.  Do not sit or stand in one place for long periods of time.  Take short rest breaks during the day. Lying down or standing is usually better than sitting. Resting can help relieve pain.  When sitting or lying down for a long time, do some mild activity or stretching. This will help to prevent stiffness and pain.  Get regular exercise. Ask your doctor what activities are safe for you.  Do not lift anything that is heavier than 10 lb (4.5 kg) or the limit that you are told, until your doctor says that it  is safe.  To prevent injury when you lift things: ? Bend your knees. ? Keep the weight close to your body. ? Avoid twisting.  Sleep on a firm mattress. Try lying on your side with your knees slightly bent. If you lie on your back, put a pillow under your knees.   Medicines  Treatment may include medicines for pain and swelling taken by mouth or put on the skin, prescription pain medicine, or muscle relaxants.  Take over-the-counter and prescription medicines only as told by your doctor.  Ask your doctor if the medicine prescribed to you: ? Requires you to avoid driving or using machinery. ? Can cause trouble pooping (constipation). You may need to take these actions to prevent or treat trouble pooping:  Drink enough fluid to keep your pee (urine) pale yellow.  Take over-the-counter or prescription medicines.  Eat foods that are high in fiber. These include beans, whole grains, and fresh fruits and vegetables.  Limit foods that are high in fat and sugars. These include fried or sweet foods. General instructions  Do not use any products that contain nicotine or tobacco, such as cigarettes, e-cigarettes, and chewing tobacco. If you need help quitting, ask your doctor.  Keep all follow-up visits as told by your doctor. This is important. Contact a doctor if:    Your pain does not get better with rest or medicine.  Your pain gets worse, or you have new pain.  You have a high fever.  You lose weight very quickly.  You have trouble doing your normal activities. Get help right away if:  One or both of your legs or feet feel weak.  One or both of your legs or feet lose feeling (have numbness).  You have trouble controlling when you poop (have a bowel movement) or pee (urinate).  You have bad back pain and: ? You feel like you may vomit (nauseous), or you vomit. ? You have pain in your belly (abdomen). ? You have shortness of breath. ? You faint. Summary  When back pain  lasts longer than 3 months, it is called chronic back pain.  Pain may get worse at certain times (flare-ups).  Use ice and heat as told by your doctor. Your doctor may tell you to use ice after flare-ups. This information is not intended to replace advice given to you by your health care provider. Make sure you discuss any questions you have with your health care provider. Document Revised: 07/05/2019 Document Reviewed: 07/05/2019 Elsevier Patient Education  2021 [employer]vier Inc.  

## 2020-10-28 NOTE — Progress Notes (Signed)
BP 124/78   Pulse 74   Temp 98.7 F (37.1 C) (Oral)   Wt 198 lb 9.6 oz (90.1 kg)   SpO2 96%   BMI 31.11 kg/m    Subjective:    Patient ID: Dale Morton, male    DOB: April 07, 1951, 70 y.o.   MRN: [id number]  HPI: Dale Morton is a 70 y.o. male  Chief Complaint  Patient presents with  . Back Pain    Patient states he is here for pain in his right lower back and states he first noticed about 3 weeks ago, patient states he noticed it more when the weather was colder. Patient denies having any pain when urinating. Patient states he will have pain if he just raises his knee. Patient states it feels as if he is pulling.    BACK PAIN (ACUTE ON CHRONIC) Is a chronic, ongoing issue since work accident in 1988 or 1989. During accident he was pulling a deck and it got broken, then pulled back during movement + has h/o falling from height during work.Did go to physical therapy for period of time, which was beneficial.  At this time has been feeling worse with an acute flare --- started about 15 days ago.  To right lower side.    Has been using Flexeril and Meloxicam at home.  Is taking one during daytime and one at night time.  Also takes Robaxin as needed.  When he is working he wears back support.   Duration:months Mechanism of injury:work accident (chronic) and lifting large bags (acute) Location:low back Onset:chronic Severity:7-8/10at worst and at best 2/10  Quality:dull and burning across lower back Frequency:intermittent Radiation:none Aggravating factors:lifting and prolonged sitting Alleviating factors:  muscle relaxer Status:fluctuating Treatments attempted:Flexeril Relief with NSAIDs?:moderate Nighttime pain:no Paresthesias / decreased sensation:no Bowel / bladder incontinence:no Fevers:no Dysuria / urinary frequency:no  Relevant past medical, surgical, family and social history reviewed and updated as indicated. Interim medical history since our  last visit reviewed. Allergies and medications reviewed and updated.  Review of Systems  Constitutional: Negative for activity change, diaphoresis, fatigue and fever.  Respiratory: Negative for cough, chest tightness, shortness of breath and wheezing.   Cardiovascular: Negative for chest pain, palpitations and leg swelling.  Gastrointestinal: Negative.   Musculoskeletal: Positive for back pain.  Skin: Negative.   Neurological: Negative.   Psychiatric/Behavioral: Negative.     Per HPI unless specifically indicated above     Objective:    BP 124/78   Pulse 74   Temp 98.7 F (37.1 C) (Oral)   Wt 198 lb 9.6 oz (90.1 kg)   SpO2 96%   BMI 31.11 kg/m   Wt Readings from Last 3 Encounters:  10/28/20 198 lb 9.6 oz (90.1 kg)  02/19/20 198 lb (89.8 kg)  08/18/19 196 lb (88.9 kg)    Physical Exam Vitals and nursing note reviewed.  Constitutional:      General: He is not in acute distress.    Appearance: He is well-developed. He is not ill-appearing.  HENT:     Head: Normocephalic and atraumatic.     Right Ear: Hearing normal. No drainage.     Left Ear: Hearing normal. No drainage.     Mouth/Throat:     Pharynx: Uvula midline.  Eyes:     General: Lids are normal.        Right eye: No discharge.        Left eye: No discharge.     Conjunctiva/sclera: Conjunctivae normal.  Pupils: Pupils are equal, round, and reactive to light.  Neck:     Thyroid: No thyromegaly.     Vascular: No carotid bruit or JVD.     Trachea: Trachea normal.  Cardiovascular:     Rate and Rhythm: Normal rate and regular rhythm.     Heart sounds: Normal heart sounds, S1 normal and S2 normal. No murmur heard. No gallop.   Pulmonary:     Effort: Pulmonary effort is normal. No accessory muscle usage or respiratory distress.     Breath sounds: Normal breath sounds.  Abdominal:     General: Bowel sounds are normal.     Palpations: Abdomen is soft. There is no hepatomegaly or splenomegaly.   Musculoskeletal:     Cervical back: Normal range of motion and neck supple.     Lumbar back: Tenderness present. No swelling, edema or lacerations. Decreased range of motion.     Right lower leg: No edema.     Left lower leg: No edema.     Comments: Mild tenderness on palpation lower back.  Decreased flexion/extension/lateral ROM.  Negative straight leg.  Skin:    General: Skin is warm and dry.     Capillary Refill: Capillary refill takes less than 2 seconds.     Findings: No rash.  Neurological:     Mental Status: He is alert and oriented to person, place, and time.     Deep Tendon Reflexes: Reflexes are normal and symmetric.  Psychiatric:        Mood and Affect: Mood normal.        Behavior: Behavior normal.        Thought Content: Thought content normal.        Judgment: Judgment normal.     Results for orders placed or performed in visit on 04/13/19  Novel Coronavirus, NAA ([facility])   Specimen: Nasopharyngeal(NP) swabs in vial transport medium   NASOPHARYNGE  TESTING  Result Value Ref Range   SARS-CoV-2, NAA Not Detected Not Detected      Assessment & Plan:   Problem List Items Addressed This Visit      Other   Low back pain - Primary    Chronic at baseline with acute flare.  Refill on Flexeril and Robaxin.  Will transition to Tizanidine next visit due to age.  Recommend alternating heat/ice + placing Voltaren gel on back at home.  Continue to rest and avoid lifting until improved.  Prednisone taper sent.  Return in 6 months.  PT referral in place per patient request.      Relevant Medications   cyclobenzaprine (FLEXERIL) 10 MG tablet   meloxicam (MOBIC) 15 MG tablet   methocarbamol (ROBAXIN) 500 MG tablet   predniSONE (DELTASONE) 10 MG tablet   Other Relevant Orders   Ambulatory referral to Physical Therapy       Follow up plan: Return in about 3 months (around 01/28/2021) for Annual physical.

## 2020-11-14 ENCOUNTER — Telehealth: Payer: Self-pay

## 2020-11-14 NOTE — Telephone Encounter (Signed)
Pt sated has not reecvied call from [facility] in [place] [address].pt would like this looked into.

## 2020-11-22 ENCOUNTER — Telehealth: Payer: Self-pay

## 2020-11-22 NOTE — Telephone Encounter (Signed)
Copied from CRM [id number]. Topic: General - Other >> Nov 22, 2020 12:17 PM Jaquita Rector A wrote: Reason for CRM: Patient daughter Gardiner Rhyme called in to inform [facility] that patient is not able to have PT until the request for authorization have been done with his workers [employer] Please advise and call Marisol at Ph# [phone]

## 2020-11-22 NOTE — Telephone Encounter (Signed)
Left message for patient daughter to give our office a call back at earliest convenience.

## 2020-11-25 NOTE — Telephone Encounter (Signed)
Spoke with patient, he states [facility] is not able to see him because his workers [employer] has not authorized it. Patient states he called the company with his workers comp and they told him they have not received any updated documentation from [place] that patient has been seen recently.

## 2020-11-26 NOTE — Telephone Encounter (Signed)
Spoke with patient, he was able to give me phone number for workers comp. [phone], also gave me a different number [phone]. Patient gave me case number #[id number]. Called and left voicemail for company to return call.

## 2020-11-29 NOTE — Telephone Encounter (Signed)
Have not been able to contact workers [employer]. Called patient and gave him fax number to give worker he has been dealing with through workers [employer]. Patient will try to communicate with them and give them fax number to have them fax [place] forms or documents we should need to proceed.

## 2020-12-11 NOTE — Telephone Encounter (Signed)
Placed forms for workers comp in [employer]. Awaiting signature to fax over.

## 2021-01-03 ENCOUNTER — Telehealth: Payer: Self-pay

## 2021-01-03 NOTE — Telephone Encounter (Signed)
Pt HAS QUESTIONS ON BIILL HE RECEIVED FOR THE APT ON 10/28/2020.WORKERS COMP WAS BILLED ON DOS PT IS UNSURE WHY HE HAS BILL PLEASE ADVISE

## 2021-01-31 ENCOUNTER — Ambulatory Visit (INDEPENDENT_AMBULATORY_CARE_PROVIDER_SITE_OTHER): Payer: Worker's Compensation | Admitting: Nurse Practitioner

## 2021-01-31 ENCOUNTER — Encounter: Payer: Self-pay | Admitting: Nurse Practitioner

## 2021-01-31 ENCOUNTER — Other Ambulatory Visit: Payer: Self-pay

## 2021-01-31 VITALS — BP 109/70 | HR 77 | Temp 98.2°F | Ht 66.0 in | Wt 198.0 lb

## 2021-01-31 DIAGNOSIS — E669 Obesity, unspecified: Secondary | ICD-10-CM

## 2021-01-31 DIAGNOSIS — R748 Abnormal levels of other serum enzymes: Secondary | ICD-10-CM

## 2021-01-31 DIAGNOSIS — N529 Male erectile dysfunction, unspecified: Secondary | ICD-10-CM

## 2021-01-31 DIAGNOSIS — M5441 Lumbago with sciatica, right side: Secondary | ICD-10-CM

## 2021-01-31 DIAGNOSIS — G8929 Other chronic pain: Secondary | ICD-10-CM

## 2021-01-31 DIAGNOSIS — R7309 Other abnormal glucose: Secondary | ICD-10-CM | POA: Diagnosis not present

## 2021-01-31 DIAGNOSIS — E1169 Type 2 diabetes mellitus with other specified complication: Secondary | ICD-10-CM

## 2021-01-31 DIAGNOSIS — N400 Enlarged prostate without lower urinary tract symptoms: Secondary | ICD-10-CM

## 2021-01-31 DIAGNOSIS — Z6831 Body mass index (BMI) 31.0-31.9, adult: Secondary | ICD-10-CM

## 2021-01-31 DIAGNOSIS — M5442 Lumbago with sciatica, left side: Secondary | ICD-10-CM

## 2021-01-31 DIAGNOSIS — R5383 Other fatigue: Secondary | ICD-10-CM

## 2021-01-31 DIAGNOSIS — E6609 Other obesity due to excess calories: Secondary | ICD-10-CM

## 2021-01-31 DIAGNOSIS — E782 Mixed hyperlipidemia: Secondary | ICD-10-CM

## 2021-01-31 MED ORDER — MELOXICAM 15 MG PO TABS: 15.0000 mg | ORAL_TABLET | Freq: Every day | ORAL | 4 refills | Status: DC

## 2021-01-31 MED ORDER — METHOCARBAMOL 500 MG PO TABS: 500.0000 mg | ORAL_TABLET | ORAL | 4 refills | Status: DC | PRN

## 2021-01-31 MED ORDER — SILDENAFIL CITRATE 50 MG PO TABS: 50.0000 mg | ORAL_TABLET | Freq: Every day | ORAL | 0 refills | Status: DC | PRN

## 2021-01-31 MED ORDER — TIZANIDINE HCL 4 MG PO CAPS: 4.0000 mg | ORAL_CAPSULE | Freq: Three times a day (TID) | ORAL | 12 refills | Status: DC | PRN

## 2021-01-31 NOTE — Assessment & Plan Note (Signed)
Chronic.  Noted past labs, no current medications.  Obtain lipid panel today and continue diet focus.  Initiate labs as needed.

## 2021-01-31 NOTE — Assessment & Plan Note (Signed)
Noted on past labs at 6.3% in 2017, recheck today.

## 2021-01-31 NOTE — Assessment & Plan Note (Signed)
Check CMP today 

## 2021-01-31 NOTE — Assessment & Plan Note (Signed)
New issue over past 6 months, check testosterone levels today and trial Viagra as needed.

## 2021-01-31 NOTE — Assessment & Plan Note (Signed)
BMI 31.96.  Recommended eating smaller high protein, low fat meals more frequently and exercising 30 mins a day 5 times a week with a goal of 10-15lb weight loss in the next 3 months. Patient voiced their understanding and motivation to adhere to these recommendations.  

## 2021-01-31 NOTE — Patient Instructions (Signed)
Critical care medicine: Principles of diagnosis and management in the adult (4th ed., pp. 6387-5643). Saunders."> Miller's anesthesia (8th ed., pp. 232-250). Saunders.">  Voluntades anticipadas Advance Directive  Las voluntades anticipadas son documentos legales que le permiten tomar decisiones acerca de su tratamiento mdico y atencin de la salud en caso de no poder expresarse usted mismo. Las voluntades anticipadas comunican sus deseos afamiliares, amigos y mdicos. La elaboracin y la redaccin de las voluntades anticipadas deben realizarse con el transcurso del [place], en lugar de que suceda todo a la misma vez. Las voluntades anticipadas se pueden cambiar y Naval architect. Hay diferentes tipos de voluntades anticipadas, por ejemplo: Poder notarial mdico. Testamento vital. Orden de no reanimar (ONR) o de no intentar la reanimacin (ONIR). Apoderado para asuntos de salud y poder notarial mdico Al apoderado para asuntos de salud tambin se le llama representante para asuntos de Freight forwarder. Esta persona es designada para tomar decisiones mdicas en representacin suya cuando usted no pueda tomar decisiones por usted mismo. Por lo general, las Camera operator piden a un amigo o familiar de confianza que acte como su apoderado y represente sus preferencias. Asegrese de tener un acuerdo con su persona de confianza para que acte como su apoderado. Es posible que un apoderado deba tomar una decisin mdica en su nombre si no se conocen susdeseos. Un poder notarial mdico, tambin llamado poder notarial duradero para asuntos de Schneider, es un documento legal que nombra a su apoderado para asuntos de Freight forwarder. En funcin de las leyes de [address], es posible que el documento tambin deba estar: Silt. Autenticado ante escribano. Fechado. Copiado. Atestiguado. Incorporado a la historia clnica del paciente. Tambin es posible que desee designar a una persona de confianza para administrar su  dinero en caso de que usted no pueda hacerlo. Esto se denomina poder notarial duradero para asuntos financieros. Este es un documento legal diferente del poder notarial duradero para asuntos de salud. Usted puede elegir a su apoderado para asuntos de salud o a una persona diferente para que actecomo su apoderado en los asuntos de dinero. Si no asigna un apoderado, o si se considera que el apoderado no est actuando para su bien, un tribunal podr Sales promotion account executive legal para que acte enrepresentacin suya. El testamento vital El testamento vital es un conjunto de instrucciones en las que se establecen sus deseos acerca de la atencin mdica para el momento en que no pueda expresarlos usted mismo. Los mdicos deben conservar una copia del testamento vital en su historia clnica. Le recomendamos que entregue una copia a familiares o amigos. Con el fin de alertar a sus cuidadores en caso de Radio broadcast assistant, puede colocar una tarjeta en la billetera que indique que tiene un testamento vital y dnde Surveyor, mining. El testamento vital se [place] si: Tiene una enfermedad terminal. Queda discapacitado. No puede comunicarse ni tomar decisiones. Debe incluir las siguientes decisiones en su testamento vital: El uso o no uso de equipos de soporte vital, como dispositivos de dilisis y Physicist, medical respiratorias (respiradores). Si desea una ONR o [place]. Esto les indica a los mdicos que no usen Equities trader (RCP) si se le detiene la respiracin o los latidos cardacos. El [place] o no uso de alimentacin por sonda. La administracin o no administracin de alimentos y lquidos. Si desea recibir atencin de alivio (cuidados paliativos) cuando el objetivo sea la comodidad ms que la [place]. Si desea donar sus rganos y tejidos. El testamento vital no da instrucciones acerca de  la distribucin del dinero nilas propiedades en caso de fallecimiento. ONR u ONIR La ONR es el pedido de no Statistician en el caso de que  el corazn o la respiracin se Insurance claims handler. Si no se realiz ni se present [employer] u ONIR, el mdico intentar ayudar a cualquier paciente cuyo corazn o respiracin se haya detenido. Si planea someterse a [place], hable con su mdico sobre elcumplimiento de su ONR u ONIR en caso de que surjan problemas. Qu sucede si no tengo una voluntad anticipada? Algunos estados asignan familiares a cargo de la toma de decisiones para que acten en representacin suya si no tiene una voluntad anticipada. Cada estado tiene sus propias leyes sobre las voluntades anticipadas. Es posible que desee consultar a su mdico, su abogado o al representante estatal acerca de lasleyes de su estado. Resumen Las voluntades anticipadas son documentos legales que le permiten tomar decisiones acerca de su tratamiento mdico y atencin de la salud en caso de no poder expresarse usted mismo. El proceso de Event organiser y Armed forces training and education officer las voluntades anticipadas debe realizarse con el transcurso del [place]. Usted puede cambiar y Economist las voluntades anticipadas en cualquier momento. Las voluntades anticipadas pueden incluir un poder mdico, un testamento vital y Neomia Dear orden ONR u [place]. Esta informacin no tiene Theme park manager el consejo del mdico. Asegresede hacerle al mdico cualquier pregunta que tenga. Document Revised: 03/21/2020 Document Reviewed: 03/21/2020 Elsevier Patient Education  2022 [employer].

## 2021-01-31 NOTE — Assessment & Plan Note (Signed)
Check PSA today and testosterone levels.

## 2021-01-31 NOTE — Progress Notes (Signed)
BP 109/70   Pulse 77   Temp 98.2 F (36.8 C) (Oral)   Ht 5\' 6"  (1.676 m)   Wt 198 lb (89.8 kg)   SpO2 97%   BMI 31.96 kg/m    Subjective:    Patient ID: Dale Morton, male    DOB: 07-Apr-1951, 70 y.o.   MRN: [id number]  HPI: Dale Morton is a 70 y.o. male presenting on 01/31/2021 for follow-up Current medical complaints include: concerns about sex drive  He currently lives with: wife Interim Problems from his last visit:  decreased sex drive   He reports decrease in sexual drive he is concerned about, this has been going on for 6 months.  He does report a little difficulty with getting erection, does have desire to have sexual activity. At night there is a desire to have activity, but erection does not last long.    BACK PAIN (CHRONIC) Chronic issue since work accident in 1988 or 1989.  During accident he was pulling a deck and it got broken, then pulled back during movement + has h/o falling from height during work.  Did go to physical therapy for period of time, which was beneficial.     Has been using Flexeril and Meloxicam at home.  Is taking one during daytime and one at night time.  Also takes Robaxin as needed.  When he is working he wears back support.   Duration: months Mechanism of injury: work accident (chronic) and lifting large bags (acute) Location: low back Onset: chronic Severity: 7-8/10 at worst and today 2/10 Quality: dull and burning across lower back Frequency: intermittent Radiation: none Aggravating factors: lifting and prolonged sitting Alleviating factors:  muscle relaxer Status: fluctuating Treatments attempted: Flexeril  Relief with NSAIDs?: moderate Nighttime pain:  no Paresthesias / decreased sensation:  no Bowel / bladder incontinence:  no Fevers:  no Dysuria / urinary frequency:  no  Functional Status Survey: Is the patient deaf or have difficulty hearing?: No Does the patient have difficulty seeing, even when wearing glasses/contacts?:  No Does the patient have difficulty concentrating, remembering, or making decisions?: No Does the patient have difficulty walking or climbing stairs?: No Does the patient have difficulty dressing or bathing?: No Does the patient have difficulty doing errands alone such as visiting a doctor's office or shopping?: No  FALL RISK: Fall Risk  01/31/2021 02/19/2020 09/05/2018 11/13/2016  Falls in the past year? 0 0 0 No  Number falls in past yr: 0 0 0 -  Injury with Fall? 0 0 0 -  Risk for fall due to : No Fall Risks No Fall Risks - -  Follow up Education provided Falls evaluation completed;Falls prevention discussed Falls evaluation completed -    Depression Screen Depression screen [facility] 2/9 01/31/2021 02/19/2020 09/05/2018 11/13/2016  Decreased Interest 0 0 0 0  Down, Depressed, Hopeless 0 0 0 1  PHQ - 2 Score 0 0 0 1  Altered sleeping 0 - - -  Tired, decreased energy 0 - - -  Change in appetite 0 - - -  Feeling bad or failure about yourself  0 - - -  Trouble concentrating 0 - - -  Moving slowly or fidgety/restless 0 - - -  Suicidal thoughts 0 - - -  PHQ-9 Score 0 - - -  Difficult doing work/chores Not difficult at all - - -    Advanced Directives A voluntary discussion about advance care planning including the explanation and discussion of advance directives  was extensively discussed  with the patient for 15 minutes with patient. Explanation about the health care proxy and living will was reviewed and packet with forms with explanation of how to fill them out was given.  During this discussion, the patient was able to identify a health care proxy as his wife and plans to fill out the paperwork required.  Patient was offered a separate Advance Care Planning visit for further assistance with forms.     Past Medical History:  Past Medical History:  Diagnosis Date   Encounter for long-term (current) use of other high-risk medications    GERD (gastroesophageal reflux disease)    Hyperlipidemia     Lumbago     Surgical History:  Past Surgical History:  Procedure Laterality Date   APPENDECTOMY      Medications:  No current outpatient medications on file prior to visit.   No current facility-administered medications on file prior to visit.    Allergies:  No Known Allergies  Social History:  Social History   Socioeconomic History   Marital status: Married    Spouse name: Not on file   Number of children: Not on file   Years of education: Not on file   Highest education level: Not on file  Occupational History   Not on file  Tobacco Use   Smoking status: Former    Types: Cigarettes    Quit date: 05/30/1985    Years since quitting: 35.6   Smokeless tobacco: Never  Vaping Use   Vaping Use: Never used  Substance and Sexual Activity   Alcohol use: No    Alcohol/week: 0.0 standard drinks   Drug use: No   Sexual activity: Yes  Other Topics Concern   Not on file  Social History Narrative   Not on file   Social Determinants of Health   Financial Resource Strain: Low Risk    Difficulty of Paying Living Expenses: Not hard at all  Food Insecurity: No Food Insecurity   Worried About Programme researcher, broadcasting/film/video in the Last Year: Never true   Ran Out of Food in the Last Year: Never true  Transportation Needs: No Transportation Needs   Lack of Transportation (Medical): No   Lack of Transportation (Non-Medical): No  Physical Activity: Sufficiently Active   Days of Exercise per Week: 4 days   Minutes of Exercise per Session: 40 min  Stress: No Stress Concern Present   Feeling of Stress : Not at all  Social Connections: Moderately Isolated   Frequency of Communication with Friends and Family: More than three times a week   Frequency of Social Gatherings with Friends and Family: More than three times a week   Attends Religious Services: Never   Database administrator or Organizations: No   Attends Engineer, structural: Never   Marital Status: Married  Careers information officer Violence: Not At Risk   Fear of Current or Ex-Partner: No   Emotionally Abused: No   Physically Abused: No   Sexually Abused: No   Social History   Tobacco Use  Smoking Status Former   Types: Cigarettes   Quit date: 05/30/1985   Years since quitting: 35.6  Smokeless Tobacco Never   Social History   Substance and Sexual Activity  Alcohol Use No   Alcohol/week: 0.0 standard drinks    Family History:  Family History  Problem Relation Age of Onset   Hypertension Father    Cancer Sister  breast    Past medical history, surgical history, medications, allergies, family history and social history reviewed with patient today and changes made to appropriate areas of the chart.   ROS All other ROS negative except what is listed above and in the HPI.      Objective:    BP 109/70   Pulse 77   Temp 98.2 F (36.8 C) (Oral)   Ht 5\' 6"  (1.676 m)   Wt 198 lb (89.8 kg)   SpO2 97%   BMI 31.96 kg/m   Wt Readings from Last 3 Encounters:  01/31/21 198 lb (89.8 kg)  10/28/20 198 lb 9.6 oz (90.1 kg)  02/19/20 198 lb (89.8 kg)    Physical Exam Vitals and nursing note reviewed.  Constitutional:      General: He is awake. He is not in acute distress.    Appearance: He is well-developed and well-groomed. He is obese. He is not ill-appearing or toxic-appearing.  HENT:     Head: Normocephalic and atraumatic.     Right Ear: Hearing, tympanic membrane, ear canal and external ear normal. No drainage.     Left Ear: Hearing, tympanic membrane, ear canal and external ear normal. No drainage.     Nose: Nose normal.     Mouth/Throat:     Pharynx: Uvula midline.  Eyes:     General: Lids are normal.        Right eye: No discharge.        Left eye: No discharge.     Extraocular Movements: Extraocular movements intact.     Conjunctiva/sclera: Conjunctivae normal.     Pupils: Pupils are equal, round, and reactive to light.     Visual Fields: Right eye visual fields normal and  left eye visual fields normal.  Neck:     Thyroid: No thyromegaly.     Vascular: No carotid bruit or JVD.     Trachea: Trachea normal.  Cardiovascular:     Rate and Rhythm: Normal rate and regular rhythm.     Heart sounds: Normal heart sounds, S1 normal and S2 normal. No murmur heard.   No gallop.  Pulmonary:     Effort: Pulmonary effort is normal. No accessory muscle usage or respiratory distress.     Breath sounds: Normal breath sounds.  Abdominal:     General: Bowel sounds are normal.     Palpations: Abdomen is soft. There is no hepatomegaly or splenomegaly.     Tenderness: There is no abdominal tenderness.  Musculoskeletal:        General: Normal range of motion.     Cervical back: Normal range of motion and neck supple.     Right lower leg: No edema.     Left lower leg: No edema.  Lymphadenopathy:     Head:     Right side of head: No submental, submandibular, tonsillar, preauricular or posterior auricular adenopathy.     Left side of head: No submental, submandibular, tonsillar, preauricular or posterior auricular adenopathy.     Cervical: No cervical adenopathy.  Skin:    General: Skin is warm and dry.     Capillary Refill: Capillary refill takes less than 2 seconds.     Findings: No rash.  Neurological:     Mental Status: He is alert and oriented to person, place, and time.     Cranial Nerves: Cranial nerves are intact.     Gait: Gait is intact.     Deep Tendon Reflexes: Reflexes are normal  and symmetric.     Reflex Scores:      Brachioradialis reflexes are 2+ on the right side and 2+ on the left side.      Patellar reflexes are 2+ on the right side and 2+ on the left side. Psychiatric:        Attention and Perception: Attention normal.        Mood and Affect: Mood normal.        Speech: Speech normal.        Behavior: Behavior normal. Behavior is cooperative.        Thought Content: Thought content normal.        Cognition and Memory: Cognition normal.         Judgment: Judgment normal.     Results for orders placed or performed in visit on 04/13/19  Novel Coronavirus, NAA ([facility])   Specimen: Nasopharyngeal(NP) swabs in vial transport medium   NASOPHARYNGE  TESTING  Result Value Ref Range   SARS-CoV-2, NAA Not Detected Not Detected      Assessment & Plan:   Problem List Items Addressed This Visit       Genitourinary   Benign prostatic hyperplasia without lower urinary tract symptoms    Check PSA today and testosterone levels.        Relevant Orders   TSH   PSA   Testosterone, free, total([facility])     Other   Hyperlipidemia    Chronic.  Noted past labs, no current medications.  Obtain lipid panel today and continue diet focus.  Initiate labs as needed.      Relevant Medications   sildenafil (VIAGRA) 50 MG tablet   Other Relevant Orders   Comprehensive metabolic panel   Lipid Panel w/o Chol/HDL Ratio   Low back pain - Primary    Chronic at baseline with occasional acute flares.  Refill on Robaxin and Meloxicam -- change Flexeril to Tizanidine due to age.  Recommend alternating heat/ice + placing Voltaren gel on back at home.  Continue to rest and avoid lifting until improved.  Prednisone taper as needed for acute flare.  Working on obtaining PT vis workmen's comp.  Return in 1 week.      Relevant Medications   methocarbamol (ROBAXIN) 500 MG tablet   tiZANidine (ZANAFLEX) 4 MG capsule   meloxicam (MOBIC) 15 MG tablet   Elevated liver enzymes    Check CMP today.      Relevant Orders   Comprehensive metabolic panel   Lipid Panel w/o Chol/HDL Ratio   Elevated hemoglobin A1c    Noted on past labs at 6.3% in 2017, recheck today.      Relevant Orders   CBC with Differential/Platelet   HgB A1c   Erectile dysfunction    New issue over past 6 months, check testosterone levels today and trial Viagra as needed.      Obesity    BMI 31.96.  Recommended eating smaller high protein, low fat meals more frequently and  exercising 30 mins a day 5 times a week with a goal of 10-15lb weight loss in the next 3 months. Patient voiced their understanding and motivation to adhere to these recommendations.       Other Visit Diagnoses     Fatigue, unspecified type       Check TSH and testosterone levels today.   Relevant Orders   TSH   Testosterone, free, total([facility])        Discussed aspirin prophylaxis for myocardial infarction prevention and decision  was it was not indicated  LABORATORY TESTING:  Health maintenance labs ordered today as discussed above.   The natural history of prostate cancer and ongoing controversy regarding screening and potential treatment outcomes of prostate cancer has been discussed with the patient. The meaning of a false positive PSA and a false negative PSA has been discussed. He indicates understanding of the limitations of this screening test and wishes to proceed with screening PSA testing.   IMMUNIZATIONS:   - Tdap: Tetanus vaccination status reviewed: last tetanus booster within 10 years. - Influenza: Up to date - Pneumovax: Up to date - Prevnar: Up to date - Zostavax vaccine: Refused - Covid = has had 2 vaccinations  SCREENING: - Colonoscopy: Up to date due next 06/09/2027 Discussed with patient purpose of the colonoscopy is to detect colon cancer at curable precancerous or early stages   - AAA Screening: Not applicable  -Hearing Test: Not applicable  -Spirometry: Not applicable   PATIENT COUNSELING:    Sexuality: Discussed sexually transmitted diseases, partner selection, use of condoms, avoidance of unintended pregnancy  and contraceptive alternatives.   Advised to avoid cigarette smoking.  I discussed with the patient that most people either abstain from alcohol or drink within safe limits (<=14/week and <=4 drinks/occasion for males, <=7/weeks and <= 3 drinks/occasion for females) and that the risk for alcohol disorders and other health effects  rises proportionally with the number of drinks per week and how often a drinker exceeds daily limits.  Discussed cessation/primary prevention of drug use and availability of treatment for abuse.   Diet: Encouraged to adjust caloric intake to maintain  or achieve ideal body weight, to reduce intake of dietary saturated fat and total fat, to limit sodium intake by avoiding high sodium foods and not adding table salt, and to maintain adequate dietary potassium and calcium preferably from fresh fruits, vegetables, and low-fat dairy products.    Stressed the importance of regular exercise  Injury prevention: Discussed safety belts, safety helmets, smoke detector, smoking near bedding or upholstery.   Dental health: Discussed importance of regular tooth brushing, flossing, and dental visits.   Follow up plan: NEXT PREVENTATIVE PHYSICAL DUE IN 1 YEAR. Return in about 1 week (around 02/07/2021) for Back Pain.

## 2021-01-31 NOTE — Assessment & Plan Note (Signed)
Chronic at baseline with occasional acute flares.  Refill on Robaxin and Meloxicam -- change Flexeril to Tizanidine due to age.  Recommend alternating heat/ice + placing Voltaren gel on back at home.  Continue to rest and avoid lifting until improved.  Prednisone taper as needed for acute flare.  Working on obtaining PT vis workmen's comp.  Return in 1 week.

## 2021-02-03 DIAGNOSIS — E1169 Type 2 diabetes mellitus with other specified complication: Secondary | ICD-10-CM | POA: Insufficient documentation

## 2021-02-03 MED ORDER — METFORMIN HCL 500 MG PO TABS: 500.0000 mg | ORAL_TABLET | Freq: Two times a day (BID) | ORAL | 4 refills | Status: DC

## 2021-02-03 MED ORDER — ONETOUCH VERIO W/DEVICE KIT: PACK | 0 refills | Status: DC

## 2021-02-03 MED ORDER — ONETOUCH DELICA LANCETS 33G MISC: 4 refills | Status: DC

## 2021-02-03 MED ORDER — ATORVASTATIN CALCIUM 10 MG PO TABS: 10.0000 mg | ORAL_TABLET | Freq: Every day | ORAL | 4 refills | Status: DC

## 2021-02-03 MED ORDER — GLUCOSE BLOOD VI STRP: ORAL_STRIP | 12 refills | Status: DC

## 2021-02-03 NOTE — Addendum Note (Signed)
Addended by: Aura Dials T on: 02/03/2021 10:49 AM   Modules accepted: Orders

## 2021-02-03 NOTE — Progress Notes (Signed)
Spanish speaking only -- will need interpreter.  Please let patient know labs have returned, overall they are stable with a few exceptions: - A1c, diabetes testing, is 6.9%.  This means he is in diabetic range.  Any A1c greater then 6.5% is considered diabetes.  I would like to start a medication called Metformin to help lower this and help the diabetes, main side effects with this can be stomach issues like gas or nausea, these should resolve over time.  I am also going to send in supplies for him to check his blood sugars daily, with goal of <130 before eating in morning.   - Cholesterol levels are elevated. I am going to send in a low dose of cholesterol medication for him to start taking to help lower his risk for stroke or heart attack.  The medication is called Atorvastatin.   - Testosterone level is a little on lower side, I would like to recheck this next visit.  I will need him to return in 6 weeks for follow-up, please schedule.  Any questions? Keep being awesome!!  Thank you for allowing me to participate in your care.  I appreciate you. Kindest regards, Senna Lape

## 2021-02-04 DIAGNOSIS — H11041 Peripheral pterygium, stationary, right eye: Secondary | ICD-10-CM | POA: Diagnosis not present

## 2021-02-06 LAB — CBC WITH DIFFERENTIAL/PLATELET
Basophils Absolute: 0.1 10*3/uL (ref 0.0–0.2)
Basos: 1 %
EOS (ABSOLUTE): 0.9 10*3/uL — ABNORMAL HIGH (ref 0.0–0.4)
Eos: 10 %
Hematocrit: 45.6 % (ref 37.5–51.0)
Hemoglobin: 15.6 g/dL (ref 13.0–17.7)
Immature Grans (Abs): 0 10*3/uL (ref 0.0–0.1)
Immature Granulocytes: 0 %
Lymphocytes Absolute: 2.5 10*3/uL (ref 0.7–3.1)
Lymphs: 26 %
MCH: 30.5 pg (ref 26.6–33.0)
MCHC: 34.2 g/dL (ref 31.5–35.7)
MCV: 89 fL (ref 79–97)
Monocytes Absolute: 0.6 10*3/uL (ref 0.1–0.9)
Monocytes: 7 %
Neutrophils Absolute: 5.2 10*3/uL (ref 1.4–7.0)
Neutrophils: 56 %
Platelets: 206 10*3/uL (ref 150–450)
RBC: 5.11 x10E6/uL (ref 4.14–5.80)
RDW: 12.6 % (ref 11.6–15.4)
WBC: 9.4 10*3/uL (ref 3.4–10.8)

## 2021-02-06 LAB — LIPID PANEL W/O CHOL/HDL RATIO
Cholesterol, Total: 258 mg/dL — ABNORMAL HIGH (ref 100–199)
HDL: 46 mg/dL (ref >39)
LDL Chol Calc (NIH): 166 mg/dL — ABNORMAL HIGH (ref 0–99)
Triglycerides: 245 mg/dL — ABNORMAL HIGH (ref 0–149)
VLDL Cholesterol Cal: 46 mg/dL — ABNORMAL HIGH (ref 5–40)

## 2021-02-06 LAB — COMPREHENSIVE METABOLIC PANEL
ALT: 16 IU/L (ref 0–44)
AST: 20 IU/L (ref 0–40)
Albumin/Globulin Ratio: 1.8 (ref 1.2–2.2)
Albumin: 5.1 g/dL — ABNORMAL HIGH (ref 3.8–4.8)
Alkaline Phosphatase: 129 IU/L — ABNORMAL HIGH (ref 44–121)
BUN/Creatinine Ratio: 11 (ref 10–24)
BUN: 9 mg/dL (ref 8–27)
Bilirubin Total: 0.8 mg/dL (ref 0.0–1.2)
CO2: 21 mmol/L (ref 20–29)
Calcium: 9.5 mg/dL (ref 8.6–10.2)
Chloride: 97 mmol/L (ref 96–106)
Creatinine, Ser: 0.81 mg/dL (ref 0.76–1.27)
Globulin, Total: 2.8 g/dL (ref 1.5–4.5)
Glucose: 119 mg/dL — ABNORMAL HIGH (ref 65–99)
Potassium: 3.9 mmol/L (ref 3.5–5.2)
Sodium: 136 mmol/L (ref 134–144)
Total Protein: 7.9 g/dL (ref 6.0–8.5)
eGFR: 96 mL/min/{1.73_m2} (ref >59)

## 2021-02-06 LAB — TESTOSTERONE, FREE, TOTAL, SHBG
Sex Hormone Binding: 31.7 nmol/L (ref 19.3–76.4)
Testosterone, Free: 5.2 pg/mL — ABNORMAL LOW (ref 6.6–18.1)
Testosterone: 242 ng/dL — ABNORMAL LOW (ref 264–916)

## 2021-02-06 LAB — TSH: TSH: 3.09 u[IU]/mL (ref 0.450–4.500)

## 2021-02-06 LAB — HEMOGLOBIN A1C
Est. average glucose Bld gHb Est-mCnc: 151 mg/dL
Hgb A1c MFr Bld: 6.9 % — ABNORMAL HIGH (ref 4.8–5.6)

## 2021-02-06 LAB — PSA: Prostate Specific Ag, Serum: 1.7 ng/mL (ref 0.0–4.0)

## 2021-02-07 ENCOUNTER — Other Ambulatory Visit: Payer: Self-pay

## 2021-02-07 ENCOUNTER — Ambulatory Visit (INDEPENDENT_AMBULATORY_CARE_PROVIDER_SITE_OTHER): Payer: Worker's Compensation | Admitting: Nurse Practitioner

## 2021-02-07 ENCOUNTER — Encounter: Payer: Self-pay | Admitting: Nurse Practitioner

## 2021-02-07 VITALS — BP 108/68 | HR 83 | Temp 98.7°F | Wt 197.6 lb

## 2021-02-07 DIAGNOSIS — G8929 Other chronic pain: Secondary | ICD-10-CM

## 2021-02-07 DIAGNOSIS — M5442 Lumbago with sciatica, left side: Secondary | ICD-10-CM

## 2021-02-07 DIAGNOSIS — M5441 Lumbago with sciatica, right side: Secondary | ICD-10-CM | POA: Diagnosis not present

## 2021-02-07 MED ORDER — GLUCOSE BLOOD VI STRP: ORAL_STRIP | 12 refills | Status: DC

## 2021-02-07 MED ORDER — MELOXICAM 15 MG PO TABS: 15.0000 mg | ORAL_TABLET | Freq: Every day | ORAL | 4 refills | Status: DC

## 2021-02-07 MED ORDER — ONETOUCH VERIO W/DEVICE KIT: PACK | 0 refills | Status: DC

## 2021-02-07 MED ORDER — TIZANIDINE HCL 4 MG PO CAPS: 4.0000 mg | ORAL_CAPSULE | Freq: Three times a day (TID) | ORAL | 12 refills | Status: DC | PRN

## 2021-02-07 MED ORDER — METHOCARBAMOL 500 MG PO TABS: 500.0000 mg | ORAL_TABLET | ORAL | 4 refills | Status: DC | PRN

## 2021-02-07 MED ORDER — ONETOUCH DELICA LANCETS 33G MISC: 4 refills | Status: DC

## 2021-02-07 NOTE — Assessment & Plan Note (Signed)
Chronic at baseline with occasional acute flares.  Refill on medications -- changed Flexeril to Tizanidine due to age.  Recommend alternating heat/ice + placing Voltaren gel on back at home.  Continue to rest and avoid lifting until improved.  Prednisone taper as needed for acute flare.  Working on obtaining PT via workmen's comp.  Return in 3 months for diabetes check.

## 2021-02-07 NOTE — Patient Instructions (Signed)
Diagnóstico de la diabetes mellitus tipo 2 en los adultos °Type 2 Diabetes Mellitus, Diagnosis, Adult °La diabetes tipo 2 (diabetes mellitus tipo 2) es una enfermedad a largo plazo (crónica). Puede ocurrir cuando existe uno de estos problemas, o ambos: °El páncreas no produce la cantidad suficiente de insulina. °El cuerpo no reacciona de forma normal a la insulina que produce. °La insulina permite que los azúcares ingresen en las células del cuerpo. Si tiene diabetes tipo 2, los azúcares no pueden ingresar en las células. Los azúcares se acumulan en la sangre. Esto causa un nivel elevado de azúcar en la sangre. °¿Cuáles son las causas? °Se desconoce la causa exacta de esta afección. °¿Qué incrementa el riesgo? °Tener diabetes tipo 2 en su familia. °Tener sobrepeso u obesidad. °Ser sedentario. °El cuerpo no reacciona de forma normal a la insulina que produce. °Tener un nivel de azúcar en la sangre más alto de lo normal con el transcurso del tiempo. °Haber sufrido un tipo de diabetes durante un embarazo. °Tener una afección que causa la formación de pequeños sacos llenos de líquido en los ovarios. °¿Cuáles son los signos o síntomas? °Al principio, es posible que usted no presente síntomas. Sus síntomas empezarán lentamente. Estos pueden incluir los siguientes: °Más sed de lo normal. °Más hambre de lo normal. °Necesidad de orinar con mayor frecuencia que lo normal. °Perder peso sin proponérselo. °Cansancio. °Sensación de debilidad. °Ver las cosas borrosas. °Manchas oscuras en la piel. °¿Cómo se trata? °Esta afección puede ser tratada por un experto en diabetes. Puede que deba hacer lo siguiente: °Seguir un plan de alimentación elaborado por un experto en alimentación (nutricionista). °Hacer ejercicio con regularidad. °Encontrar formas de lidiar con el estrés. °Comprobar su nivel de azúcar en la sangre con la frecuencia que le hayan indicado. °Tomar medicamentos. °El médico fijará los objetivos del tratamiento para  usted. Su nivel de azúcar en la sangre debe estar en estos niveles: °Antes de las comidas: de 80 a 130 mg/dl (de 4.4 a 7.2 mmol/l). °Después de las comidas: por debajo de 180 mg/dl (10 mmol/l). °En los últimos 2 a 3 meses: menos del 7 %. °Siga estas instrucciones en su casa: °Medicamentos °Tome los medicamentos para la diabetes o aplíquese la insulina todos los días. °Tome los medicamentos como le hayan indicado para prevenir otros problemas causados por esta afección. Puede necesitar: °Aspirina. °Medicamentos para bajar el colesterol. °Medicamentos para controlar la presión arterial. °Preguntas para hacerle al médico °¿Es necesario que me reúna con un educador en el cuidado de la diabetes? °¿Qué medicamentos necesito y cuándo debo tomarlos? °¿Qué necesitaré para tratar mi afección en casa? °¿Cuándo debo comprobar mi azúcar en la sangre? °¿Dónde puedo encontrar un grupo de apoyo? °¿A quién puedo llamar si tengo preguntas? °¿Cuándo es mi próxima visita con el médico? °Instrucciones generales °Use los medicamentos de venta libre y los recetados solamente como se lo haya indicado el médico. °Concurra a todas las visitas de seguimiento. °Dónde buscar más información °Para obtener ayuda y orientación, así como más información sobre la diabetes, visite: °American Diabetes Association (ADA) (Asociación Estadounidense de la Diabetes): www.diabetes.org °American Association of Diabetes Care and Education Specialists (ADCES) (Asociación Estadounidense de Especialistas en Atención y Educación sobre la Diabetes): www.diabeteseducator.org °International Diabetes Federation (IDF) (Federación Internacional de Diabetes): www.idf.org °Comuníquese con un médico si: °El nivel de azúcar en la sangre es igual o mayor que 240 mg/dl (13.3 mmol/dl) durante 2 días seguidos. °Se ha sentido enfermo durante 2 o más días y no mejora. °Ha tenido   fiebre durante 2 o más días y no mejora. °Tiene alguno de estos problemas durante más de 6 horas: °No  puede comer ni beber. °Siente que va a vomitar. °Vomita. °Presenta heces líquidas (diarrea). °Solicite ayuda de inmediato si: °El nivel de azúcar en la sangre está por debajo de 54 mg/dl (3 mmol/l). °Se siente desorientado (confundido). °Tiene problemas para pensar con claridad. °Tiene dificultad para respirar. °Tiene niveles medios o altos de cetonas en la orina. °Estos síntomas pueden indicar una emergencia. Solicite ayuda de inmediato. Comuníquese con el servicio de emergencias de su localidad (911 en los [place]). °No espere a ver si los síntomas desaparecen. °No conduzca por sus propios medios hasta el hospital. °Resumen °La diabetes tipo 2 es una enfermedad de larga duración. Es posible que el páncreas no produzca suficiente insulina o que el cuerpo no reaccione de forma normal a la insulina que produce. °Esta afección se trata con un plan de alimentación, cambios en el estilo de vida y medicamentos. °El médico fijará los objetivos del tratamiento para usted. Estos lo ayudarán a mantener el azúcar en la sangre en un nivel saludable. °Concurra a todas las visitas de seguimiento. °Esta información no tiene como fin reemplazar el consejo del médico. Asegúrese de hacerle al médico cualquier pregunta que tenga. °Document Revised: 09/12/2020 Document Reviewed: 09/12/2020 °Elsevier Patient Education © 2022 Elsevier Inc. ° °

## 2021-02-07 NOTE — Progress Notes (Signed)
BP 108/68   Pulse 83   Temp 98.7 F (37.1 C) (Oral)   Wt 197 lb 9.6 oz (89.6 kg)   SpO2 95%   BMI 31.89 kg/m    Subjective:    Patient ID: Dale Morton, male    DOB: [birth year], 70 y.o.   MRN: [id number]  HPI: Dale Morton is a 70 y.o. male  Chief Complaint  Patient presents with   Back Pain    Patient is here to follow up on back pain. Patient states he has not heard back from Workers Comp as far as his approval for medications to help with his back pain. Patient states when he sits for long periods of time he notices that he has more pain. Patient states he has a old prescription for pain he is currently taking.    He was diagnosed with diabetes at physical last week and started on Metformin, which he is taking.    BACK PAIN  Chronic issue since work accident in 1988 or 1989.  During accident he was pulling a deck and it got broken, then pulled back during movement + has h/o falling from height during work.  Did go to physical therapy for period of time, which was beneficial -- we are trying to get physical therapy on board at this time via workmen's comp.    Has been using Tizanidine and Meloxicam at home.  Is taking one during daytime and one at night time.  Also takes Robaxin as needed.  When he is working he wears back support.   Duration: months Mechanism of injury: work accident (chronic)  Location: low back Onset: chronic Severity: 5-6/10 at worst and at best 2/10  Quality: dull and burning across lower back Frequency: intermittent Radiation: none Aggravating factors: lifting and prolonged sitting Alleviating factors:  muscle relaxer Status: fluctuating Treatments attempted: Flexeril  Relief with NSAIDs?: moderate Nighttime pain:  no Paresthesias / decreased sensation:  no Bowel / bladder incontinence:  no Fevers:  no Dysuria / urinary frequency:  no  Relevant past medical, surgical, family and social history reviewed and updated as indicated. Interim  medical history since our last visit reviewed. Allergies and medications reviewed and updated.  Review of Systems  Constitutional:  Negative for activity change, diaphoresis, fatigue and fever.  Respiratory:  Negative for cough, chest tightness, shortness of breath and wheezing.   Cardiovascular:  Negative for chest pain, palpitations and leg swelling.  Gastrointestinal: Negative.   Musculoskeletal:  Positive for back pain.  Skin: Negative.   Neurological: Negative.   Psychiatric/Behavioral: Negative.     Per HPI unless specifically indicated above     Objective:    BP 108/68   Pulse 83   Temp 98.7 F (37.1 C) (Oral)   Wt 197 lb 9.6 oz (89.6 kg)   SpO2 95%   BMI 31.89 kg/m   Wt Readings from Last 3 Encounters:  02/07/21 197 lb 9.6 oz (89.6 kg)  01/31/21 198 lb (89.8 kg)  10/28/20 198 lb 9.6 oz (90.1 kg)    Physical Exam Vitals and nursing note reviewed.  Constitutional:      General: He is not in acute distress.    Appearance: He is well-developed. He is not ill-appearing.  HENT:     Head: Normocephalic and atraumatic.     Right Ear: Hearing normal. No drainage.     Left Ear: Hearing normal. No drainage.     Mouth/Throat:     Pharynx: Uvula midline.  Eyes:     General: Lids are normal.        Right eye: No discharge.        Left eye: No discharge.     Conjunctiva/sclera: Conjunctivae normal.     Pupils: Pupils are equal, round, and reactive to light.  Neck:     Thyroid: No thyromegaly.     Vascular: No carotid bruit or JVD.     Trachea: Trachea normal.  Cardiovascular:     Rate and Rhythm: Normal rate and regular rhythm.     Heart sounds: Normal heart sounds, S1 normal and S2 normal. No murmur heard.   No gallop.  Pulmonary:     Effort: Pulmonary effort is normal. No accessory muscle usage or respiratory distress.     Breath sounds: Normal breath sounds.  Abdominal:     General: Bowel sounds are normal.     Palpations: Abdomen is soft. There is no  hepatomegaly or splenomegaly.  Musculoskeletal:     Cervical back: Normal range of motion and neck supple.     Lumbar back: Tenderness present. No swelling, edema or lacerations. Decreased range of motion.     Right lower leg: No edema.     Left lower leg: No edema.     Comments: Mild tenderness on palpation lower back.  Decreased flexion/extension/lateral ROM.  Negative straight leg.  Skin:    General: Skin is warm and dry.     Capillary Refill: Capillary refill takes less than 2 seconds.     Findings: No rash.  Neurological:     Mental Status: He is alert and oriented to person, place, and time.     Deep Tendon Reflexes: Reflexes are normal and symmetric.  Psychiatric:        Mood and Affect: Mood normal.        Behavior: Behavior normal.        Thought Content: Thought content normal.        Judgment: Judgment normal.   Results for orders placed or performed in visit on 01/31/21  CBC with Differential/Platelet  Result Value Ref Range   WBC 9.4 3.4 - 10.8 x10E3/uL   RBC 5.11 4.14 - 5.80 x10E6/uL   Hemoglobin 15.6 13.0 - 17.7 g/dL   Hematocrit 45.6 37.5 - 51.0 %   MCV 89 79 - 97 fL   MCH 30.5 26.6 - 33.0 pg   MCHC 34.2 31.5 - 35.7 g/dL   RDW 12.6 11.6 - 15.4 %   Platelets 206 150 - 450 x10E3/uL   Neutrophils 56 Not Estab. %   Lymphs 26 Not Estab. %   Monocytes 7 Not Estab. %   Eos 10 Not Estab. %   Basos 1 Not Estab. %   Neutrophils Absolute 5.2 1.4 - 7.0 x10E3/uL   Lymphocytes Absolute 2.5 0.7 - 3.1 x10E3/uL   Monocytes Absolute 0.6 0.1 - 0.9 x10E3/uL   EOS (ABSOLUTE) 0.9 (H) 0.0 - 0.4 x10E3/uL   Basophils Absolute 0.1 0.0 - 0.2 x10E3/uL   Immature Granulocytes 0 Not Estab. %   Immature Grans (Abs) 0.0 0.0 - 0.1 x10E3/uL  Comprehensive metabolic panel  Result Value Ref Range   Glucose 119 (H) 65 - 99 mg/dL   BUN 9 8 - 27 mg/dL   Creatinine, Ser 0.81 0.76 - 1.27 mg/dL   eGFR 96 >59 mL/min/1.73   BUN/Creatinine Ratio 11 10 - 24   Sodium 136 134 - 144 mmol/L    Potassium 3.9 3.5 - 5.2 mmol/L  Chloride 97 96 - 106 mmol/L   CO2 21 20 - 29 mmol/L   Calcium 9.5 8.6 - 10.2 mg/dL   Total Protein 7.9 6.0 - 8.5 g/dL   Albumin 5.1 (H) 3.8 - 4.8 g/dL   Globulin, Total 2.8 1.5 - 4.5 g/dL   Albumin/Globulin Ratio 1.8 1.2 - 2.2   Bilirubin Total 0.8 0.0 - 1.2 mg/dL   Alkaline Phosphatase 129 (H) 44 - 121 IU/L   AST 20 0 - 40 IU/L   ALT 16 0 - 44 IU/L  Lipid Panel w/o Chol/HDL Ratio  Result Value Ref Range   Cholesterol, Total 258 (H) 100 - 199 mg/dL   Triglycerides 245 (H) 0 - 149 mg/dL   HDL 46 >39 mg/dL   VLDL Cholesterol Cal 46 (H) 5 - 40 mg/dL   LDL Chol Calc (NIH) 166 (H) 0 - 99 mg/dL  TSH  Result Value Ref Range   TSH 3.090 0.450 - 4.500 uIU/mL  PSA  Result Value Ref Range   Prostate Specific Ag, Serum 1.7 0.0 - 4.0 ng/mL  HgB A1c  Result Value Ref Range   Hgb A1c MFr Bld 6.9 (H) 4.8 - 5.6 %   Est. average glucose Bld gHb Est-mCnc 151 mg/dL  Testosterone, free, total([facility])  Result Value Ref Range   Testosterone 242 (L) 264 - 916 ng/dL   Testosterone, Free 5.2 (L) 6.6 - 18.1 pg/mL   Sex Hormone Binding 31.7 19.3 - 76.4 nmol/L      Assessment & Plan:   Problem List Items Addressed This Visit       Other   Low back pain - Primary    Chronic at baseline with occasional acute flares.  Refill on medications -- changed Flexeril to Tizanidine due to age.  Recommend alternating heat/ice + placing Voltaren gel on back at home.  Continue to rest and avoid lifting until improved.  Prednisone taper as needed for acute flare.  Working on obtaining PT via workmen's comp.  Return in 3 months for diabetes check.      Relevant Medications   methocarbamol (ROBAXIN) 500 MG tablet   meloxicam (MOBIC) 15 MG tablet   tiZANidine (ZANAFLEX) 4 MG capsule     Follow up plan: Return in about 3 months (around 05/09/2021) for T2DM and HLD + TESTOSTERONE CHECK.

## 2021-03-18 ENCOUNTER — Other Ambulatory Visit: Payer: Self-pay

## 2021-03-18 ENCOUNTER — Other Ambulatory Visit: Payer: Self-pay | Admitting: Nurse Practitioner

## 2021-03-18 ENCOUNTER — Other Ambulatory Visit: Payer: Medicare Other

## 2021-03-18 DIAGNOSIS — E1169 Type 2 diabetes mellitus with other specified complication: Secondary | ICD-10-CM

## 2021-03-18 DIAGNOSIS — E785 Hyperlipidemia, unspecified: Secondary | ICD-10-CM | POA: Diagnosis not present

## 2021-03-18 DIAGNOSIS — R7989 Other specified abnormal findings of blood chemistry: Secondary | ICD-10-CM

## 2021-03-18 NOTE — Addendum Note (Signed)
Addended by: Mortimer Fries on: 03/18/2021 08:57 AM   Modules accepted: Orders

## 2021-03-19 NOTE — Progress Notes (Signed)
Please let patient know labs have returned.   -Testosterone levels are improving this check, no intervention needed at this time - A1c, diabetes testing, has trended down from 6.9% to 6.8% -- still showing diabetic range but at goal of less then 7% -- please ensure you are taking Metformin 500 MG twice a day - Cholesterol levels look MUCH improved, continue Atorvastatin daily.  I will see you in December.  Any questions? Keep being awesome!!  Thank you for allowing me to participate in your care.  I appreciate you. Kindest regards, Erikka Follmer

## 2021-03-20 LAB — LIPID PANEL W/O CHOL/HDL RATIO
Cholesterol, Total: 143 mg/dL (ref 100–199)
HDL: 43 mg/dL (ref >39)
LDL Chol Calc (NIH): 79 mg/dL (ref 0–99)
Triglycerides: 114 mg/dL (ref 0–149)
VLDL Cholesterol Cal: 21 mg/dL (ref 5–40)

## 2021-03-20 LAB — TESTOSTERONE, FREE, TOTAL, SHBG
Sex Hormone Binding: 34.9 nmol/L (ref 19.3–76.4)
Testosterone, Free: 8.5 pg/mL (ref 6.6–18.1)
Testosterone: 391 ng/dL (ref 264–916)

## 2021-03-20 LAB — HEMOGLOBIN A1C
Est. average glucose Bld gHb Est-mCnc: 148 mg/dL
Hgb A1c MFr Bld: 6.8 % — ABNORMAL HIGH (ref 4.8–5.6)

## 2021-04-25 ENCOUNTER — Ambulatory Visit (INDEPENDENT_AMBULATORY_CARE_PROVIDER_SITE_OTHER): Payer: Medicare Other

## 2021-04-25 ENCOUNTER — Other Ambulatory Visit: Payer: Self-pay

## 2021-04-25 DIAGNOSIS — Z23 Encounter for immunization: Secondary | ICD-10-CM | POA: Diagnosis not present

## 2021-04-25 MED ORDER — SILDENAFIL CITRATE 50 MG PO TABS: 50.0000 mg | ORAL_TABLET | Freq: Every day | ORAL | 0 refills | Status: DC | PRN

## 2021-04-25 NOTE — Patient Instructions (Addendum)
[phone] [facility]

## 2021-04-25 NOTE — Progress Notes (Signed)
Error- did not need to see provider. Here for flu vaccine.

## 2021-04-25 NOTE — Progress Notes (Signed)
Patient seen today for influenza vaccination. Patient given high dose flu vaccine in left arm. Patient tolerated well.

## 2021-04-28 DIAGNOSIS — E113393 Type 2 diabetes mellitus with moderate nonproliferative diabetic retinopathy without macular edema, bilateral: Secondary | ICD-10-CM | POA: Diagnosis not present

## 2021-05-09 ENCOUNTER — Other Ambulatory Visit: Payer: Self-pay | Admitting: Nurse Practitioner

## 2021-05-09 ENCOUNTER — Other Ambulatory Visit: Payer: Self-pay

## 2021-05-09 ENCOUNTER — Ambulatory Visit (INDEPENDENT_AMBULATORY_CARE_PROVIDER_SITE_OTHER): Payer: Medicare Other | Admitting: Nurse Practitioner

## 2021-05-09 ENCOUNTER — Encounter: Payer: Self-pay | Admitting: Nurse Practitioner

## 2021-05-09 VITALS — BP 109/73 | HR 76 | Temp 98.6°F | Wt 197.0 lb

## 2021-05-09 DIAGNOSIS — E1169 Type 2 diabetes mellitus with other specified complication: Secondary | ICD-10-CM | POA: Diagnosis not present

## 2021-05-09 DIAGNOSIS — E669 Obesity, unspecified: Secondary | ICD-10-CM

## 2021-05-09 DIAGNOSIS — E785 Hyperlipidemia, unspecified: Secondary | ICD-10-CM | POA: Diagnosis not present

## 2021-05-09 DIAGNOSIS — M5441 Lumbago with sciatica, right side: Secondary | ICD-10-CM

## 2021-05-09 DIAGNOSIS — E6609 Other obesity due to excess calories: Secondary | ICD-10-CM | POA: Diagnosis not present

## 2021-05-09 DIAGNOSIS — G8929 Other chronic pain: Secondary | ICD-10-CM | POA: Diagnosis not present

## 2021-05-09 DIAGNOSIS — M5442 Lumbago with sciatica, left side: Secondary | ICD-10-CM

## 2021-05-09 DIAGNOSIS — R7989 Other specified abnormal findings of blood chemistry: Secondary | ICD-10-CM

## 2021-05-09 DIAGNOSIS — Z6831 Body mass index (BMI) 31.0-31.9, adult: Secondary | ICD-10-CM

## 2021-05-09 LAB — MICROALBUMIN, URINE WAIVED
Creatinine, Urine Waived: 200 mg/dL (ref 10–300)
Microalb, Ur Waived: 10 mg/L (ref 0–19)
Microalb/Creat Ratio: <30 mg/g (ref <30)

## 2021-05-09 LAB — BAYER DCA HB A1C WAIVED: HB A1C (BAYER DCA - WAIVED): 6.4 % — ABNORMAL HIGH (ref 4.8–5.6)

## 2021-05-09 MED ORDER — TIZANIDINE HCL 4 MG PO CAPS: 4.0000 mg | ORAL_CAPSULE | Freq: Three times a day (TID) | ORAL | 12 refills | Status: DC | PRN

## 2021-05-09 NOTE — Assessment & Plan Note (Addendum)
Diagnosed in August 2022, at this time A1c continues to be stable at 6.4% and urine ALB 10.  Continue Metformin 500 MG BID and consider addition of ACE/ARB in future -- although would monitor closely with his current stable BP.  Recommend he check BS at home a few mornings a week with goal <130.  BMP today. Return in 6 months.

## 2021-05-09 NOTE — Assessment & Plan Note (Signed)
BMI 31.08.  Recommended eating smaller high protein, low fat meals more frequently and exercising 30 mins a day 5 times a week with a goal of 10-15lb weight loss in the next 3 months. Patient voiced their understanding and motivation to adhere to these recommendations.  

## 2021-05-09 NOTE — Assessment & Plan Note (Signed)
Chronic, ongoing.  Continue current medication regimen and adjust as needed.  Lipid panel up to date and improved levels with statin on board.

## 2021-05-09 NOTE — Assessment & Plan Note (Signed)
Chronic at baseline with occasional acute flares.  Refill on medications -- Tizanidine due to age > 7.  Recommend alternating heat/ice + placing Voltaren gel on back at home.  Continue to rest and avoid lifting until improved.  Prednisone taper as needed for acute flare.  Working on obtaining PT via workmen's comp.

## 2021-05-09 NOTE — Patient Instructions (Signed)

## 2021-05-09 NOTE — Progress Notes (Signed)
BP 109/73   Pulse 76   Temp 98.6 F (37 C) (Oral)   Wt 197 lb (89.4 kg)   SpO2 96%   BMI 31.80 kg/m    Subjective:    Patient ID: Dale Morton, male    DOB: 1950/12/31, 70 y.o.   MRN: [id number]  HPI: Dale Morton is a 70 y.o. male  Chief Complaint  Patient presents with   Diabetes   Hyperlipidemia   Testosterone Check   DIABETES Diagnosed in August with A1c 6.9%, was started on Metformin 500 MG BID and Atorvastatin 10 MG daily for HLD. Hypoglycemic episodes:no Polydipsia/polyuria: no Visual disturbance: no Chest pain: no Paresthesias: no Glucose Monitoring: no  Accucheck frequency: Not Checking  Fasting glucose:  Post prandial:  Evening:  Before meals: Taking Insulin?: no  Long acting insulin:  Short acting insulin: Blood Pressure Monitoring: not checking Retinal Examination: Up To Date -- he reports having one locally, but does not recall who -- he reports everything was normal Foot Exam: Up to Date Pneumovax: Up to Date Influenza: Up to Date Aspirin: no  The 10-year ASCVD risk score (Arnett DK, et al., 2019) is: 21.3%   Values used to calculate the score:     Age: 7 years     Sex: Male     Is Non-Hispanic African American: No     Diabetic: Yes     Tobacco smoker: No     Systolic Blood Pressure: 0000000 mmHg     Is BP treated: No     HDL Cholesterol: 43 mg/dL     Total Cholesterol: 143 mg/dL  LOW TESTOSTERONE: History of low testosterone on labs in August and then in October levels had improved some to low normal.  He would like rechecked today due to ongoing fatigue and decreased sexual drive.  BACK PAIN  Chronic, since work accident in 1988 or 1989.  During accident he was pulling a deck and it got broken, then pulled back during movement + has h/o falling from height during work.  Did go to physical therapy for period of time, which was beneficial.   Has been using Tizanidine and Meloxicam at home.  Is taking one during daytime and one at night  time. When he is doing tasks he wears back support.   Duration: months Mechanism of injury: work accident (chronic)  Location: low back Onset: chronic Severity: 4-5/10 at worst and at best 2/10  Quality: dull and burning across lower back Frequency: intermittent Radiation: none Aggravating factors: lifting and prolonged sitting Alleviating factors:  muscle relaxer Status: fluctuating Treatments attempted: Flexeril  Relief with NSAIDs?: moderate Nighttime pain:  no Paresthesias / decreased sensation:  no Bowel / bladder incontinence:  no Fevers:  no Dysuria / urinary frequency:  no  Relevant past medical, surgical, family and social history reviewed and updated as indicated. Interim medical history since our last visit reviewed. Allergies and medications reviewed and updated.  Review of Systems  Constitutional:  Negative for activity change, diaphoresis, fatigue and fever.  Respiratory:  Negative for cough, chest tightness, shortness of breath and wheezing.   Cardiovascular:  Negative for chest pain, palpitations and leg swelling.  Gastrointestinal: Negative.   Musculoskeletal:  Positive for back pain.  Skin: Negative.   Neurological: Negative.   Psychiatric/Behavioral: Negative.     Per HPI unless specifically indicated above    Objective:    BP 109/73   Pulse 76   Temp 98.6 F (37 C) (Oral)  Wt 197 lb (89.4 kg)   SpO2 96%   BMI 31.80 kg/m   Wt Readings from Last 3 Encounters:  05/09/21 197 lb (89.4 kg)  02/07/21 197 lb 9.6 oz (89.6 kg)  01/31/21 198 lb (89.8 kg)    Physical Exam Vitals and nursing note reviewed.  Constitutional:      General: He is not in acute distress.    Appearance: He is well-developed. He is not ill-appearing.  HENT:     Head: Normocephalic and atraumatic.     Right Ear: Hearing normal. No drainage.     Left Ear: Hearing normal. No drainage.     Mouth/Throat:     Pharynx: Uvula midline.  Eyes:     General: Lids are normal.         Right eye: No discharge.        Left eye: No discharge.     Conjunctiva/sclera: Conjunctivae normal.     Pupils: Pupils are equal, round, and reactive to light.  Neck:     Thyroid: No thyromegaly.     Vascular: No carotid bruit or JVD.     Trachea: Trachea normal.  Cardiovascular:     Rate and Rhythm: Normal rate and regular rhythm.     Heart sounds: Normal heart sounds, S1 normal and S2 normal. No murmur heard.   No gallop.  Pulmonary:     Effort: Pulmonary effort is normal. No accessory muscle usage or respiratory distress.     Breath sounds: Normal breath sounds.  Abdominal:     General: Bowel sounds are normal.     Palpations: Abdomen is soft. There is no hepatomegaly or splenomegaly.  Musculoskeletal:     Cervical back: Normal range of motion and neck supple.     Lumbar back: Tenderness present. No swelling, edema or lacerations. Decreased range of motion.     Right lower leg: No edema.     Left lower leg: No edema.     Comments: Mild tenderness on palpation lower back.  Decreased flexion/extension/lateral ROM.  Negative straight leg.  Skin:    General: Skin is warm and dry.     Capillary Refill: Capillary refill takes less than 2 seconds.  Neurological:     Mental Status: He is alert and oriented to person, place, and time.     Deep Tendon Reflexes: Reflexes are normal and symmetric.  Psychiatric:        Mood and Affect: Mood normal.        Behavior: Behavior normal.        Thought Content: Thought content normal.        Judgment: Judgment normal.   Diabetic Foot Exam - Simple   Simple Foot Form Visual Inspection No deformities, no ulcerations, no other skin breakdown bilaterally: Yes Sensation Testing Intact to touch and monofilament testing bilaterally: Yes Pulse Check Posterior Tibialis and Dorsalis pulse intact bilaterally: Yes Comments      Results for orders placed or performed in visit on 03/18/21  Testosterone, free, total([facility])  Result  Value Ref Range   Testosterone 391 264 - 916 ng/dL   Testosterone, Free 8.5 6.6 - 18.1 pg/mL   Sex Hormone Binding 34.9 19.3 - 76.4 nmol/L  HgB A1c  Result Value Ref Range   Hgb A1c MFr Bld 6.8 (H) 4.8 - 5.6 %   Est. average glucose Bld gHb Est-mCnc 148 mg/dL  Lipid Panel w/o Chol/HDL Ratio  Result Value Ref Range   Cholesterol, Total 143 100 - 199  mg/dL   Triglycerides 182 0 - 149 mg/dL   HDL 43 >99 mg/dL   VLDL Cholesterol Cal 21 5 - 40 mg/dL   LDL Chol Calc (NIH) 79 0 - 99 mg/dL      Assessment & Plan:   Problem List Items Addressed This Visit       Endocrine   Hyperlipidemia associated with type 2 diabetes mellitus (HCC)    Chronic, ongoing.  Continue current medication regimen and adjust as needed.  Lipid panel up to date and improved levels with statin on board.       Relevant Orders   Basic metabolic panel   Bayer DCA Hb B7J Waived   Type 2 diabetes mellitus with obesity (HCC) - Primary    Diagnosed in August 2022, at this time A1c continues to be stable at 6.4% and urine ALB 10.  Continue Metformin 500 MG BID and consider addition of ACE/ARB in future -- although would monitor closely with his current stable BP.  Recommend he check BS at home a few mornings a week with goal <130.  BMP today. Return in 6 months.      Relevant Orders   Basic metabolic panel   Bayer DCA Hb I9C Waived   Microalbumin, Urine Waived     Other   Low back pain    Chronic at baseline with occasional acute flares.  Refill on medications -- Tizanidine due to age > 43.  Recommend alternating heat/ice + placing Voltaren gel on back at home.  Continue to rest and avoid lifting until improved.  Prednisone taper as needed for acute flare.  Working on obtaining PT via workmen's comp.        Relevant Medications   tiZANidine (ZANAFLEX) 4 MG capsule   Obesity    BMI 31.08.  Recommended eating smaller high protein, low fat meals more frequently and exercising 30 mins a day 5 times a week with a  goal of 10-15lb weight loss in the next 3 months. Patient voiced their understanding and motivation to adhere to these recommendations.       Other Visit Diagnoses     Low testosterone in male       Recheck testosterone today and send to urology as needed.   Relevant Orders   Testosterone, free, total([facility])        Follow up plan: Return in about 6 months (around 11/07/2021) for T2DM, HLD, BACK PAIN.

## 2021-05-10 NOTE — Progress Notes (Signed)
Please let Dale Morton know his labs have returned and kidney function remains stable + testosterone levels normal.  No changes needed.  If any questions let me know.  Have a great day!! Keep being amazing!!  Thank you for allowing me to participate in your care.  I appreciate you. Kindest regards, Zeddie Njie

## 2021-05-10 NOTE — Telephone Encounter (Signed)
Requested medication (s) are on the active medication list: yes  Future visit scheduled: 11/07/21  Notes to clinic:  pharm states pt requesting tablets instead of capsules, please assess. (Not delegated)      Requested Prescriptions  Pending Prescriptions Disp Refills   tiZANidine (ZANAFLEX) 4 MG capsule [Pharmacy Med Name: TIZANIDINE HCL 4 MG CAPSULE] 90 capsule 12    Sig: TAKE 1 CAPSULE BY MOUTH 3 TIMES DAILY AS NEEDED FOR MUSCLE SPASMS.     Not Delegated - Cardiovascular:  Alpha-2 [facility] - tizanidine Failed - 05/09/2021  8:55 AM      Failed - This refill cannot be delegated      Passed - Valid encounter within last 6 months    Recent Outpatient Visits           Yesterday Type 2 diabetes mellitus with obesity (HCC)   [facility] [place], Corrie Dandy T, NP   2 weeks ago Need for influenza vaccination   [facility] [place], [place], CMA   3 months ago Chronic bilateral low back pain with bilateral sciatica   [facility] [place], Jolene T, NP   3 months ago Chronic bilateral low back pain with bilateral sciatica   [facility] [place], Jolene T, NP   6 months ago Chronic bilateral low back pain with bilateral sciatica   [facility] [place], Dorie Rank, NP       Future Appointments             In 6 months [name], Dorie Rank, NP [employer] [facility], [facility]

## 2021-05-12 LAB — BASIC METABOLIC PANEL
BUN/Creatinine Ratio: 12 (ref 10–24)
BUN: 9 mg/dL (ref 8–27)
CO2: 23 mmol/L (ref 20–29)
Calcium: 9.5 mg/dL (ref 8.6–10.2)
Chloride: 100 mmol/L (ref 96–106)
Creatinine, Ser: 0.73 mg/dL — ABNORMAL LOW (ref 0.76–1.27)
Glucose: 100 mg/dL — ABNORMAL HIGH (ref 70–99)
Potassium: 4.2 mmol/L (ref 3.5–5.2)
Sodium: 139 mmol/L (ref 134–144)
eGFR: 98 mL/min/{1.73_m2} (ref >59)

## 2021-05-12 LAB — TESTOSTERONE, FREE, TOTAL, SHBG
Sex Hormone Binding: 33 nmol/L (ref 19.3–76.4)
Testosterone, Free: 11.8 pg/mL (ref 6.6–18.1)
Testosterone: 395 ng/dL (ref 264–916)

## 2021-05-12 NOTE — Telephone Encounter (Signed)
See note from the pharmacy. RX started for tablets.

## 2021-06-19 ENCOUNTER — Encounter: Payer: Self-pay | Admitting: Nurse Practitioner

## 2021-06-19 ENCOUNTER — Ambulatory Visit (INDEPENDENT_AMBULATORY_CARE_PROVIDER_SITE_OTHER): Payer: Worker's Compensation | Admitting: Nurse Practitioner

## 2021-06-19 ENCOUNTER — Other Ambulatory Visit: Payer: Self-pay

## 2021-06-19 VITALS — BP 136/79 | HR 76 | Wt 197.0 lb

## 2021-06-19 DIAGNOSIS — M5441 Lumbago with sciatica, right side: Secondary | ICD-10-CM

## 2021-06-19 DIAGNOSIS — M5442 Lumbago with sciatica, left side: Secondary | ICD-10-CM | POA: Diagnosis not present

## 2021-06-19 DIAGNOSIS — G8929 Other chronic pain: Secondary | ICD-10-CM | POA: Diagnosis not present

## 2021-06-19 MED ORDER — ATORVASTATIN CALCIUM 10 MG PO TABS: 10.0000 mg | ORAL_TABLET | Freq: Every day | ORAL | 4 refills | Status: DC

## 2021-06-19 MED ORDER — PREDNISONE 10 MG PO TABS: ORAL_TABLET | ORAL | 0 refills | Status: DC

## 2021-06-19 MED ORDER — GABAPENTIN 600 MG PO TABS: 300.0000 mg | ORAL_TABLET | Freq: Every day | ORAL | 4 refills | Status: DC

## 2021-06-19 MED ORDER — METFORMIN HCL 500 MG PO TABS: 500.0000 mg | ORAL_TABLET | Freq: Two times a day (BID) | ORAL | 4 refills | Status: DC

## 2021-06-19 NOTE — Progress Notes (Signed)
BP 136/79    Pulse 76    Wt 197 lb (89.4 kg)    SpO2 95%    BMI 31.80 kg/m    Subjective:    Patient ID: Dale Morton, male    DOB: 13-Nov-1950, 71 y.o.   MRN: [id number]  HPI: Dale Morton is a 71 y.o. male  Chief Complaint  Patient presents with   Back Pain    Patient is here for follow up on Back Pain. Patient states his back pain is hurting him more lately. Patient states the pain is constant and when he sits down it when he notices the most pain. Patient states the medication does help. Patient states he takes only his prescription for Meloxicam at night to help with pain.    BACK PAIN  About one week ago pain to lower back started hurting more. Not currently getting physical therapy.  Worse due to cold weather.  Chronic issue since work accident in 1988 or 1989.  During accident he was pulling a deck and it got broken, then pulled back during movement + has h/o falling from height during work.  Did go to physical therapy for period of time, which was beneficial -- reports this was not approved recently.  Last imaging was in 2016.  Has been using Tizanidine and Meloxicam at home.  Is taking one Meloxicam at times as needed -- takes Tizanidine at night only. When he is working he wears back support.   Duration: months Mechanism of injury: work accident (chronic)  Location: low back Onset: chronic Severity: 6-7/10 Quality: dull and burning across lower back Frequency: constant Radiation: none Aggravating factors: lifting and prolonged sitting Alleviating factors:  muscle relaxer and Meloxicam Status: fluctuating Treatments attempted: Flexeril and medications noted above Relief with NSAIDs?: moderate Nighttime pain:  yes Paresthesias / decreased sensation:  no Bowel / bladder incontinence:  no Fevers: none Dysuria / urinary frequency:  no  Relevant past medical, surgical, family and social history reviewed and updated as indicated. Interim medical history since our last  visit reviewed. Allergies and medications reviewed and updated.  Review of Systems  Constitutional:  Negative for activity change, diaphoresis, fatigue and fever.  Respiratory:  Negative for cough, chest tightness, shortness of breath and wheezing.   Cardiovascular:  Negative for chest pain, palpitations and leg swelling.  Gastrointestinal: Negative.   Musculoskeletal:  Positive for back pain.  Skin: Negative.   Neurological: Negative.   Psychiatric/Behavioral: Negative.     Per HPI unless specifically indicated above     Objective:    BP 136/79    Pulse 76    Wt 197 lb (89.4 kg)    SpO2 95%    BMI 31.80 kg/m   Wt Readings from Last 3 Encounters:  06/19/21 197 lb (89.4 kg)  05/09/21 197 lb (89.4 kg)  02/07/21 197 lb 9.6 oz (89.6 kg)    Physical Exam Vitals and nursing note reviewed.  Constitutional:      General: He is not in acute distress.    Appearance: He is well-developed. He is not ill-appearing.  HENT:     Head: Normocephalic and atraumatic.     Right Ear: Hearing normal. No drainage.     Left Ear: Hearing normal. No drainage.     Mouth/Throat:     Pharynx: Uvula midline.  Eyes:     General: Lids are normal.        Right eye: No discharge.  Left eye: No discharge.     Conjunctiva/sclera: Conjunctivae normal.     Pupils: Pupils are equal, round, and reactive to light.  Neck:     Thyroid: No thyromegaly.     Vascular: No carotid bruit or JVD.     Trachea: Trachea normal.  Cardiovascular:     Rate and Rhythm: Normal rate and regular rhythm.     Heart sounds: Normal heart sounds, S1 normal and S2 normal. No murmur heard.   No gallop.  Pulmonary:     Effort: Pulmonary effort is normal. No accessory muscle usage or respiratory distress.     Breath sounds: Normal breath sounds.  Abdominal:     General: Bowel sounds are normal.     Palpations: Abdomen is soft. There is no hepatomegaly or splenomegaly.  Musculoskeletal:     Cervical back: Normal range of  motion and neck supple.     Lumbar back: Tenderness present. No swelling, edema or lacerations. Decreased range of motion.     Right lower leg: No edema.     Left lower leg: No edema.     Comments: Mild tenderness on palpation lower back.  Decreased flexion/extension/lateral ROM.  Negative straight leg.  Skin:    General: Skin is warm and dry.     Capillary Refill: Capillary refill takes less than 2 seconds.     Findings: No rash.  Neurological:     Mental Status: He is alert and oriented to person, place, and time.     Deep Tendon Reflexes: Reflexes are normal and symmetric.  Psychiatric:        Mood and Affect: Mood normal.        Behavior: Behavior normal.        Thought Content: Thought content normal.        Judgment: Judgment normal.   Results for orders placed or performed in visit on 30/16/01  Basic metabolic panel  Result Value Ref Range   Glucose 100 (H) 70 - 99 mg/dL   BUN 9 8 - 27 mg/dL   Creatinine, Ser 0.73 (L) 0.76 - 1.27 mg/dL   eGFR 98 >59 mL/min/1.73   BUN/Creatinine Ratio 12 10 - 24   Sodium 139 134 - 144 mmol/L   Potassium 4.2 3.5 - 5.2 mmol/L   Chloride 100 96 - 106 mmol/L   CO2 23 20 - 29 mmol/L   Calcium 9.5 8.6 - 10.2 mg/dL  Bayer DCA Hb A1c Waived  Result Value Ref Range   HB A1C (BAYER DCA - WAIVED) 6.4 (H) 4.8 - 5.6 %  Microalbumin, Urine Waived  Result Value Ref Range   Microalb, Ur Waived 10 0 - 19 mg/L   Creatinine, Urine Waived 200 10 - 300 mg/dL   Microalb/Creat Ratio <30 <30 mg/g  Testosterone, free, total(Labcorp/Sunquest)  Result Value Ref Range   Testosterone 395 264 - 916 ng/dL   Testosterone, Free 11.8 6.6 - 18.1 pg/mL   Sex Hormone Binding 33.0 19.3 - 76.4 nmol/L      Assessment & Plan:   Problem List Items Addressed This Visit       Other   Low back pain    Chronic at baseline with current acute flare.  Refill on medications -- Tizanidine due to age > 77. Will send in Prednisone taper, which has benefited patient before, to  monitor blood sugar while on this.  Start Gabapentin 300 MG QHS and may increase to 600 MG if needed in upcoming week, educated him on  this medication and use for pain.  Recommend alternating heat/ice + placing Voltaren gel on back at home.  Continue to rest and avoid lifting until improved.  Work on obtaining PT via workmen's comp when he returns from vacation.      Relevant Medications   predniSONE (DELTASONE) 10 MG tablet     Follow up plan: Return for as scheduled in June.

## 2021-06-19 NOTE — Patient Instructions (Signed)
Dolor de espalda crnico Chronic Back Pain Cuando el dolor en la espalda dura ms de 3 meses, se denomina dolor de espalda crnico. Es posible que se desconozca la causa de esta afeccin. Algunas causas frecuentes son las siguientes: Desgaste (enfermedad degenerativa) de los huesos, los ligamentos o los discos de la espalda. Inflamacin y rigidez en la espalda (artritis). Las personas que sufren dolor de espalda crnico generalmente atraviesan determinados perodos en los que este es ms intenso (episodios de exacerbacin del dolor). Muchas personas pueden aprender a controlar el dolor de espalda con el cuidado en el hogar. Siga estas instrucciones en su casa: Est atento a cualquier cambio en los sntomas. Tome estas medidas para aliviar el dolor: Control del dolor y de la rigidez   Si se lo indican, aplique hielo sobre la zona dolorida. El mdico puede recomendarle que se aplique hielo durante las primeras 24 a 48 horas despus del comienzo de un episodio de exacerbacin del dolor. Para hacer esto: Ponga el hielo en una bolsa plstica. Coloque una toalla entre la piel y la bolsa. Coloque el hielo durante 20 minutos, 2 a 3 veces al da. Si se lo indican, aplique calor en la zona afectada con la frecuencia que le haya indicado el mdico. Use la fuente de calor que el mdico le recomiende, como una compresa de calor hmedo o una almohadilla trmica. Coloque una toalla entre la piel y la fuente de calor. Aplique calor durante 20 a 30 minutos. Retire la fuente de calor si la piel se pone de color rojo brillante. Esto es especialmente importante si no puede sentir dolor, calor o fro. Puede correr un riesgo mayor de sufrir quemaduras. Intente tomar un bao de inmersin con agua caliente. Actividad  Evite agacharse y realizar otras actividades que agraven el problema. Mantenga una postura correcta mientras est de pie o sentado: Cuando est de pie, mantenga la parte alta de la espalda y el cuello  rectos, con los hombros hacia atrs. Evite encorvarse. Cuando est sentado, mantenga la espalda recta y relaje los hombros. No curve los hombros ni los eche hacia atrs. No permanezca sentado o de pie en el mismo lugar durante mucho tiempo. Durante el da, descanse durante lapsos breves. Esto le aliviar el dolor. Descansar recostado o de pie suele ser mejor que hacerlo sentado. Cuando descanse durante perodos ms largos, incorpore alguna actividad suave o ejercicios de elongacin entre uno y otro. Esto ayudar a evitar la rigidez y el dolor. Haga ejercicio con regularidad. Pregntele al mdico qu actividades son seguras para usted. No levante ningn objeto que pese ms de 10 libras (4.5 kg) o que supere el lmite de peso que le hayan indicado, hasta que el mdico le diga que puede hacerlo. Siempre use las tcnicas correctas para levantar objetos, entre ellas: Flexionar las rodillas. Mantener la carga cerca del cuerpo. No torcerse. Duerma sobre un colchn firme en una posicin cmoda. Intente acostarse de costado, con las rodillas ligeramente flexionadas. Si se recuesta sobre la espalda, coloque una almohada debajo de las rodillas. Medicamentos El tratamiento puede incluir medicamentos para el dolor y la inflamacin que se toman por boca o que se aplican sobre la piel, analgsicos recetados o relajantes musculares. Use los medicamentos de venta libre y los recetados solamente como se lo haya indicado el mdico. Pregntele al mdico si el medicamento recetado: Hace necesario que evite conducir o usar maquinaria. Puede causarle estreimiento. Es posible que tenga que tomar estas medidas para prevenir o tratar el estreimiento:   Beber suficiente lquido como para mantener la orina de color amarillo plido. Usar medicamentos recetados o de venta libre. Consumir alimentos ricos en fibra, como frijoles, cereales integrales, y frutas y verduras frescas. Limitar el consumo de alimentos ricos en grasa y  azcares procesados, como los alimentos fritos o dulces. Instrucciones generales No consuma ningn producto que contenga nicotina o tabaco, como cigarrillos, cigarrillos electrnicos y tabaco de mascar. Si necesita ayuda para dejar de consumir estos productos, consulte al mdico. Concurra a todas las visitas de seguimiento como se lo haya indicado el mdico. Esto es importante. Comunquese con un mdico si: Siente un dolor que no se alivia con reposo o medicamentos. Su dolor empeora o tiene un dolor nuevo. Tiene fiebre alta. Pierde de peso con rapidez. Tiene dificultad para realizar las actividades cotidianas. Solicite ayuda de inmediato si: Siente debilidad o adormecimiento en una o ambas piernas, o en uno o ambos pies. Tiene dificultad para controlar la miccin o la defecacin. Siente un dolor intenso en la espalda y tiene alguno de los siguientes sntomas: Nuseas o vmitos. Dolor en el abdomen. Le falta el aire o se desmaya. Resumen El dolor de espalda crnico es aquel que dura ms de 3 meses. Cuando comienza un episodio de exacerbacin del dolor, aplique hielo en la zona dolorida durante las primeras 24 a 48 horas. Aplquese una compresa de calor hmedo o una almohadilla trmica como se lo haya indicado el mdico. Cuando descanse durante perodos ms largos, incorpore alguna actividad suave o ejercicios de elongacin entre uno y otro. Esto ayudar a evitar la rigidez y el dolor. Esta informacin no tiene como fin reemplazar el consejo del mdico. Asegrese de hacerle al mdico cualquier pregunta que tenga. Document Revised: 09/19/2019 Document Reviewed: 09/19/2019 Elsevier Patient Education  2022 Elsevier Inc.  

## 2021-06-19 NOTE — Assessment & Plan Note (Signed)
Chronic at baseline with current acute flare.  Refill on medications -- Tizanidine due to age > 64. Will send in Prednisone taper, which has benefited patient before, to monitor blood sugar while on this.  Start Gabapentin 300 MG QHS and may increase to 600 MG if needed in upcoming week, educated him on this medication and use for pain.  Recommend alternating heat/ice + placing Voltaren gel on back at home.  Continue to rest and avoid lifting until improved.  Work on obtaining PT via workmen's comp when he returns from vacation.

## 2021-07-11 ENCOUNTER — Encounter: Payer: Self-pay | Admitting: Nurse Practitioner

## 2021-10-07 ENCOUNTER — Ambulatory Visit (INDEPENDENT_AMBULATORY_CARE_PROVIDER_SITE_OTHER): Payer: Medicare Other | Admitting: Nurse Practitioner

## 2021-10-07 ENCOUNTER — Encounter: Payer: Medicare Other | Admitting: Nurse Practitioner

## 2021-10-07 ENCOUNTER — Encounter: Payer: Self-pay | Admitting: Nurse Practitioner

## 2021-10-07 DIAGNOSIS — G8929 Other chronic pain: Secondary | ICD-10-CM

## 2021-10-07 DIAGNOSIS — M5441 Lumbago with sciatica, right side: Secondary | ICD-10-CM

## 2021-10-07 DIAGNOSIS — M5442 Lumbago with sciatica, left side: Secondary | ICD-10-CM | POA: Diagnosis not present

## 2021-10-07 MED ORDER — TIZANIDINE HCL 4 MG PO TABS: 4.0000 mg | ORAL_TABLET | Freq: Four times a day (QID) | ORAL | 4 refills | Status: DC | PRN

## 2021-10-07 MED ORDER — GABAPENTIN 600 MG PO TABS: 300.0000 mg | ORAL_TABLET | Freq: Every day | ORAL | 4 refills | Status: DC

## 2021-10-07 MED ORDER — MELOXICAM 15 MG PO TABS: 15.0000 mg | ORAL_TABLET | Freq: Every day | ORAL | 4 refills | Status: DC

## 2021-10-07 NOTE — Progress Notes (Signed)
? ?BP 112/73   Pulse 82   Temp 98.1 ?F (36.7 ?C) (Oral)   Ht 5' 6"  (1.676 m)   Wt 199 lb 3.2 oz (90.4 kg)   SpO2 95%   BMI 32.15 kg/m?   ? ?Subjective:  ? ? Patient ID: Dale Morton, male    DOB: 19-Jul-1950, 71 y.o.   MRN: [id number] ? ?HPI: ?Dale Morton is a 71 y.o. male ? ?Chief Complaint  ?Patient presents with  ? Physical Therapy   ?  Patient is here to follow up with provider to discuss extending his physical therapy. Patient says Worker's Compensation has not receive any orders for patient to start his physical therapy. Patient says Worker's Comp. are requesting orders for the patient.   ? ?Worker's Comp # [phone] = Dale Morton ? ?BACK PAIN  ?Just came back from [place] and back pain has worsened due to change from warm temperatures to cold temperatures.  He would like to return to physical therapy via Worker's Comp, has number above to call and advise next steps for referral.  He did well in past with physical therapy. ? ?Chronic issue since work accident in 1988 or 1989.  During accident he was pulling a deck and it got broken, then pulled back during movement + has h/o falling from height during work.  Did go to physical therapy for period of time, which was beneficial -- reports this was not approved recently.  Last imaging was in 2016, refuses repeat at this time. ? ?Has been using Tizanidine and Meloxicam at home, but reports his worker's comp will not cover these as this is separate from his [facility].  Was taking one Meloxicam at times as needed -- takes Tizanidine at night only. When he is working he wears back support.   ?Duration: months ?Mechanism of injury: work accident (chronic)  ?Location: low back ?Onset: chronic ?Severity: 7/10 ?Quality: dull and burning across lower back ?Frequency: constant ?Radiation: none ?Aggravating factors: lifting and prolonged sitting ?Alleviating factors:  muscle relaxer and Meloxicam ?Status: fluctuating ?Treatments attempted: Flexeril and medications  noted above ?Relief with NSAIDs?: moderate ?Nighttime pain:  yes ?Paresthesias / decreased sensation:  no ?Bowel / bladder incontinence:  no ?Fevers: none ?Dysuria / urinary frequency:  no ? ?Relevant past medical, surgical, family and social history reviewed and updated as indicated. Interim medical history since our last visit reviewed. ?Allergies and medications reviewed and updated. ? ?Review of Systems  ?Constitutional:  Negative for activity change, diaphoresis, fatigue and fever.  ?Respiratory:  Negative for cough, chest tightness, shortness of breath and wheezing.   ?Cardiovascular:  Negative for chest pain, palpitations and leg swelling.  ?Gastrointestinal: Negative.   ?Musculoskeletal:  Positive for back pain.  ?Skin: Negative.   ?Neurological: Negative.   ?Psychiatric/Behavioral: Negative.    ? ?Per HPI unless specifically indicated above ? ?   ?Objective:  ?  ?BP 112/73   Pulse 82   Temp 98.1 ?F (36.7 ?C) (Oral)   Ht 5' 6"  (1.676 m)   Wt 199 lb 3.2 oz (90.4 kg)   SpO2 95%   BMI 32.15 kg/m?   ?Wt Readings from Last 3 Encounters:  ?10/07/21 199 lb 3.2 oz (90.4 kg)  ?06/19/21 197 lb (89.4 kg)  ?05/09/21 197 lb (89.4 kg)  ?  ?Physical Exam ?Vitals and nursing note reviewed.  ?Constitutional:   ?   General: He is not in acute distress. ?   Appearance: He is well-developed. He is not ill-appearing.  ?  HENT:  ?   Head: Normocephalic and atraumatic.  ?   Right Ear: Hearing normal. No drainage.  ?   Left Ear: Hearing normal. No drainage.  ?   Mouth/Throat:  ?   Pharynx: Uvula midline.  ?Eyes:  ?   General: Lids are normal.     ?   Right eye: No discharge.     ?   Left eye: No discharge.  ?   Conjunctiva/sclera: Conjunctivae normal.  ?   Pupils: Pupils are equal, round, and reactive to light.  ?Neck:  ?   Thyroid: No thyromegaly.  ?   Vascular: No carotid bruit or JVD.  ?   Trachea: Trachea normal.  ?Cardiovascular:  ?   Rate and Rhythm: Normal rate and regular rhythm.  ?   Heart sounds: Normal heart sounds,  S1 normal and S2 normal. No murmur heard. ?  No gallop.  ?Pulmonary:  ?   Effort: Pulmonary effort is normal. No accessory muscle usage or respiratory distress.  ?   Breath sounds: Normal breath sounds.  ?Abdominal:  ?   General: Bowel sounds are normal.  ?   Palpations: Abdomen is soft. There is no hepatomegaly or splenomegaly.  ?Musculoskeletal:  ?   Cervical back: Normal range of motion and neck supple.  ?   Lumbar back: Tenderness present. No swelling, edema or lacerations. Decreased range of motion.  ?   Right lower leg: No edema.  ?   Left lower leg: No edema.  ?   Comments: Mild tenderness on palpation lower back.  Decreased flexion/extension/lateral ROM.  Negative straight leg.  ?Skin: ?   General: Skin is warm and dry.  ?   Capillary Refill: Capillary refill takes less than 2 seconds.  ?   Findings: No rash.  ?Neurological:  ?   Mental Status: He is alert and oriented to person, place, and time.  ?   Deep Tendon Reflexes: Reflexes are normal and symmetric.  ?Psychiatric:     ?   Mood and Affect: Mood normal.     ?   Behavior: Behavior normal.     ?   Thought Content: Thought content normal.     ?   Judgment: Judgment normal.  ? ?Results for orders placed or performed in visit on 05/09/21  ?Basic metabolic panel  ?Result Value Ref Range  ? Glucose 100 (H) 70 - 99 mg/dL  ? BUN 9 8 - 27 mg/dL  ? Creatinine, Ser 0.73 (L) 0.76 - 1.27 mg/dL  ? eGFR 98 >59 mL/min/1.73  ? BUN/Creatinine Ratio 12 10 - 24  ? Sodium 139 134 - 144 mmol/L  ? Potassium 4.2 3.5 - 5.2 mmol/L  ? Chloride 100 96 - 106 mmol/L  ? CO2 23 20 - 29 mmol/L  ? Calcium 9.5 8.6 - 10.2 mg/dL  ?Bayer DCA Hb A1c Waived  ?Result Value Ref Range  ? HB A1C (BAYER DCA - WAIVED) 6.4 (H) 4.8 - 5.6 %  ?Microalbumin, Urine Waived  ?Result Value Ref Range  ? Microalb, Ur Waived 10 0 - 19 mg/L  ? Creatinine, Urine Waived 200 10 - 300 mg/dL  ? Microalb/Creat Ratio <30 <30 mg/g  ?Testosterone, free, total([facility]/Sunquest)  ?Result Value Ref Range  ? Testosterone  395 264 - 916 ng/dL  ? Testosterone, Free 11.8 6.6 - 18.1 pg/mL  ? Sex Hormone Binding 33.0 19.3 - 76.4 nmol/L  ? ?   ?Assessment & Plan:  ? ?Problem List Items Addressed This Visit   ? ?  ?  Other  ? Low back pain  ?  Chronic at baseline.  Refill on medications -- Tizanidine due to age > 36 and Meloxicam sent.  Continue Gabapentin 300 MG QHS and may increase to 600 MG if needed in upcoming weeks, educated him on this medication and use for pain.  Recommend alternating heat/ice + placing Voltaren gel on back at home.  Continue to rest and avoid lifting until improved.  Work on obtaining PT via workmen's comp will have staff call Worker's Comp # [phone] = Dale Morton and find out what is needed. ? ?  ?  ? Relevant Medications  ? tiZANidine (ZANAFLEX) 4 MG tablet  ? meloxicam (MOBIC) 15 MG tablet  ?  ? ?Follow up plan: ?Return for as scheduled 11/17/21 diabetes visit . ?

## 2021-10-07 NOTE — Assessment & Plan Note (Signed)
Chronic at baseline.  Refill on medications -- Tizanidine due to age > 24 and Meloxicam sent.  Continue Gabapentin 300 MG QHS and may increase to 600 MG if needed in upcoming weeks, educated him on this medication and use for pain.  Recommend alternating heat/ice + placing Voltaren gel on back at home.  Continue to rest and avoid lifting until improved.  Work on obtaining PT via workmen's comp will have staff call Worker's Comp # [phone] = Amanda Pea and find out what is needed. ?

## 2021-10-07 NOTE — Patient Instructions (Signed)
Dolor de espalda crnico Chronic Back Pain Cuando el dolor en la espalda dura ms de 3 meses, se denomina dolor de espalda crnico. Es posible que se desconozca la causa de esta afeccin. Algunas causas frecuentes son las siguientes: Desgaste (enfermedad degenerativa) de los huesos, los ligamentos o los discos de la espalda. Inflamacin y rigidez en la espalda (artritis). Las personas que sufren dolor de espalda crnico generalmente atraviesan determinados perodos en los que este es ms intenso (episodios de exacerbacin del dolor). Muchas personas pueden aprender a controlar el dolor de espalda con el cuidado en el hogar. Siga estas instrucciones en su casa: Est atento a cualquier cambio en los sntomas. Tome estas medidas para aliviar el dolor: Control del dolor y de la rigidez     Si se lo indican, aplique hielo sobre la zona dolorida. El mdico puede recomendarle que se aplique hielo durante las primeras 24 a 48 horas despus del comienzo de un episodio de exacerbacin del dolor. Para hacer esto: Ponga el hielo en una bolsa plstica. Coloque una toalla entre la piel y la bolsa. Coloque el hielo durante 20 minutos, 2 a 3 veces al da. Si se lo indican, aplique calor en la zona afectada con la frecuencia que le haya indicado el mdico. Use la fuente de calor que el mdico le recomiende, como una compresa de calor hmedo o una almohadilla trmica. Coloque una toalla entre la piel y la fuente de calor. Aplique calor durante 20 a 30 minutos. Retire la fuente de calor si la piel se pone de color rojo brillante. Esto es especialmente importante si no puede sentir dolor, calor o fro. Puede correr un riesgo mayor de sufrir quemaduras. Intente tomar un bao de inmersin con agua caliente. Actividad  Evite agacharse y realizar otras actividades que agraven el problema. Mantenga una postura correcta mientras est de pie o sentado: Cuando est de pie, mantenga la parte alta de la espalda y el  cuello rectos, con los hombros hacia atrs. Evite encorvarse. Cuando est sentado, mantenga la espalda recta y relaje los hombros. No curve los hombros ni los eche hacia atrs. No permanezca sentado o de pie en el mismo lugar durante mucho tiempo. Durante el da, descanse durante lapsos breves. Esto le aliviar el dolor. Descansar recostado o de pie suele ser mejor que hacerlo sentado. Cuando descanse durante perodos ms largos, incorpore alguna actividad suave o ejercicios de elongacin entre uno y otro. Esto ayudar a evitar la rigidez y el dolor. Haga ejercicio con regularidad. Pregntele al mdico qu actividades son seguras para usted. No levante ningn objeto que pese ms de 10 libras (4.5 kg) o que supere el lmite de peso que le hayan indicado, hasta que el mdico le diga que puede hacerlo. Siempre use las tcnicas correctas para levantar objetos, entre ellas: Flexionar las rodillas. Mantener la carga cerca del cuerpo. No torcerse. Duerma sobre un colchn firme en una posicin cmoda. Intente acostarse de costado, con las rodillas ligeramente flexionadas. Si se recuesta sobre la espalda, coloque una almohada debajo de las rodillas. Medicamentos El tratamiento puede incluir medicamentos para el dolor y la inflamacin que se toman por boca o que se aplican sobre la piel, analgsicos recetados o relajantes musculares. Use los medicamentos de venta libre y los recetados solamente como se lo haya indicado el mdico. Pregntele al mdico si el medicamento recetado: Hace necesario que evite conducir o usar maquinaria. Puede causarle estreimiento. Es posible que tenga que tomar estas medidas para prevenir o tratar   el estreimiento: Beber suficiente lquido como para mantener la orina de color amarillo plido. Usar medicamentos recetados o de venta libre. Consumir alimentos ricos en fibra, como frijoles, cereales integrales, y frutas y verduras frescas. Limitar el consumo de alimentos ricos en  grasa y azcares procesados, como los alimentos fritos o dulces. Instrucciones generales No consuma ningn producto que contenga nicotina o tabaco, como cigarrillos, cigarrillos electrnicos y tabaco de mascar. Si necesita ayuda para dejar de consumir estos productos, consulte al mdico. Concurra a todas las visitas de seguimiento como se lo haya indicado el mdico. Esto es importante. Comunquese con un mdico si: Siente un dolor que no se alivia con reposo o medicamentos. Su dolor empeora o tiene un dolor nuevo. Tiene fiebre alta. Pierde de peso con rapidez. Tiene dificultad para realizar las actividades cotidianas. Solicite ayuda de inmediato si: Siente debilidad o adormecimiento en una o ambas piernas, o en uno o ambos pies. Tiene dificultad para controlar la miccin o la defecacin. Siente un dolor intenso en la espalda y tiene alguno de los siguientes sntomas: Nuseas o vmitos. Dolor en el abdomen. Le falta el aire o se desmaya. Resumen El dolor de espalda crnico es aquel que dura ms de 3 meses. Cuando comienza un episodio de exacerbacin del dolor, aplique hielo en la zona dolorida durante las primeras 24 a 48 horas. Aplquese una compresa de calor hmedo o una almohadilla trmica como se lo haya indicado el mdico. Cuando descanse durante perodos ms largos, incorpore alguna actividad suave o ejercicios de elongacin entre uno y otro. Esto ayudar a evitar la rigidez y el dolor. Esta informacin no tiene como fin reemplazar el consejo del mdico. Asegrese de hacerle al mdico cualquier pregunta que tenga. Document Revised: 09/19/2019 Document Reviewed: 09/19/2019 Elsevier Patient Education  2023 Elsevier Inc.  

## 2021-10-08 ENCOUNTER — Telehealth: Payer: Self-pay

## 2021-10-08 NOTE — Telephone Encounter (Signed)
Left message for [facility] Worker's Compensation case worker in regards to what we need to do to get patient started with his physical therapy.  ? ?Ok for [facility]/Nurse Triage to gather information if case worker calls back.  ?

## 2021-10-08 NOTE — Telephone Encounter (Signed)
-----   Message from [name], NP sent at 10/07/2021  2:36 PM EDT ----- ?Please call Worker's Comp # [phone] = [facility] and find out how and what we need to do to order physical therapy for this patient covered by his workmen's comp.  Let me know when find out.:) ?

## 2021-10-10 ENCOUNTER — Encounter: Payer: Self-pay | Admitting: Internal Medicine

## 2021-10-10 ENCOUNTER — Ambulatory Visit (INDEPENDENT_AMBULATORY_CARE_PROVIDER_SITE_OTHER): Payer: Medicare Other | Admitting: Internal Medicine

## 2021-10-10 VITALS — BP 128/69 | HR 76 | Temp 98.3°F | Wt 199.2 lb

## 2021-10-10 DIAGNOSIS — R079 Chest pain, unspecified: Secondary | ICD-10-CM | POA: Insufficient documentation

## 2021-10-10 DIAGNOSIS — K219 Gastro-esophageal reflux disease without esophagitis: Secondary | ICD-10-CM

## 2021-10-10 MED ORDER — OMEPRAZOLE 40 MG PO CPDR: 40.0000 mg | DELAYED_RELEASE_CAPSULE | Freq: Every day | ORAL | 3 refills | Status: DC

## 2021-10-10 NOTE — Progress Notes (Signed)
? ?BP 128/69   Pulse 76   Temp 98.3 ?F (36.8 ?C) (Oral)   Wt 199 lb 3.2 oz (90.4 kg)   SpO2 96%   BMI 32.15 kg/m?   ? ?Subjective:  ? ? Patient ID: Dale Morton, male    DOB: 01/17/1951, 71 y.o.   MRN: [id number] ? ?Chief Complaint  ?Patient presents with  ?? chest pain  ?  Pt states he has been having chest pains for about a month, states the pain got worse today and yesterday. States he thinks he may have acid reflux   ? ? ?HPI: ?Dale Morton is a 71 y.o. male ? ?Gastroesophageal Reflux ?He complains of heartburn. He reports no abdominal pain, no belching, no chest pain, no choking, no coughing, no dysphagia, no early satiety, no globus sensation, no hoarse voice, no nausea, no sore throat, no stridor, no tooth decay, no water brash or no wheezing. feels like he has acid reflux. some chest pain since x 1 month and worsening x 3 days. he has been eating spicy food and etoh 2 days ago ate spicy food and felt this started it. This is a new problem.  ? ?Chief Complaint  ?Patient presents with  ?? chest pain  ?  Pt states he has been having chest pains for about a month, states the pain got worse today and yesterday. States he thinks he may have acid reflux   ? ? ?Relevant past medical, surgical, family and social history reviewed and updated as indicated. Interim medical history since our last visit reviewed. ?Allergies and medications reviewed and updated. ? ?Review of Systems  ?HENT:  Negative for hoarse voice and sore throat.   ?Respiratory:  Negative for cough, choking and wheezing.   ?Cardiovascular:  Negative for chest pain.  ?Gastrointestinal:  Positive for heartburn. Negative for abdominal pain, dysphagia and nausea.  ? ?Per HPI unless specifically indicated above ? ?   ?Objective:  ?  ?BP 128/69   Pulse 76   Temp 98.3 ?F (36.8 ?C) (Oral)   Wt 199 lb 3.2 oz (90.4 kg)   SpO2 96%   BMI 32.15 kg/m?   ?Wt Readings from Last 3 Encounters:  ?10/10/21 199 lb 3.2 oz (90.4 kg)  ?10/07/21 199 lb 3.2 oz (90.4  kg)  ?06/19/21 197 lb (89.4 kg)  ?  ?Physical Exam ?Vitals and nursing note reviewed.  ?Constitutional:   ?   General: He is not in acute distress. ?   Appearance: Normal appearance. He is not ill-appearing or diaphoretic.  ?HENT:  ?   Head: Normocephalic and atraumatic.  ?   Right Ear: Tympanic membrane and external ear normal. There is no impacted cerumen.  ?   Left Ear: External ear normal.  ?   Nose: No congestion or rhinorrhea.  ?   Mouth/Throat:  ?   Pharynx: No oropharyngeal exudate or posterior oropharyngeal erythema.  ?Eyes:  ?   Conjunctiva/sclera: Conjunctivae normal.  ?   Pupils: Pupils are equal, round, and reactive to light.  ?Cardiovascular:  ?   Rate and Rhythm: Normal rate and regular rhythm.  ?   Heart sounds: No murmur heard. ?  No friction rub. No gallop.  ?Pulmonary:  ?   Effort: No respiratory distress.  ?   Breath sounds: No stridor. No wheezing or rhonchi.  ?Chest:  ?   Chest wall: No tenderness.  ?Musculoskeletal:  ?   Cervical back: Normal range of motion and neck supple. No rigidity or  tenderness.  ?   Left lower leg: No edema.  ?Skin: ?   General: Skin is warm and dry.  ?Neurological:  ?   Mental Status: He is alert.  ? ?Results for orders placed or performed in visit on 05/09/21  ?Basic metabolic panel  ?Result Value Ref Range  ? Glucose 100 (H) 70 - 99 mg/dL  ? BUN 9 8 - 27 mg/dL  ? Creatinine, Ser 0.73 (L) 0.76 - 1.27 mg/dL  ? eGFR 98 >59 mL/min/1.73  ? BUN/Creatinine Ratio 12 10 - 24  ? Sodium 139 134 - 144 mmol/L  ? Potassium 4.2 3.5 - 5.2 mmol/L  ? Chloride 100 96 - 106 mmol/L  ? CO2 23 20 - 29 mmol/L  ? Calcium 9.5 8.6 - 10.2 mg/dL  ?Bayer DCA Hb A1c Waived  ?Result Value Ref Range  ? HB A1C (BAYER DCA - WAIVED) 6.4 (H) 4.8 - 5.6 %  ?Microalbumin, Urine Waived  ?Result Value Ref Range  ? Microalb, Ur Waived 10 0 - 19 mg/L  ? Creatinine, Urine Waived 200 10 - 300 mg/dL  ? Microalb/Creat Ratio <30 <30 mg/g  ?Testosterone, free, total([facility])  ?Result Value Ref Range  ?  Testosterone 395 264 - 916 ng/dL  ? Testosterone, Free 11.8 6.6 - 18.1 pg/mL  ? Sex Hormone Binding 33.0 19.3 - 76.4 nmol/L  ? ?   ? ? ?Current Outpatient Medications:  ??  atorvastatin (LIPITOR) 10 MG tablet, Take 1 tablet (10 mg total) by mouth daily., Disp: 90 tablet, Rfl: 4 ??  Blood Glucose Monitoring Suppl (ONETOUCH VERIO) w/Device KIT, Use to check blood sugar 3 times a day and document results, bring to appointments.  Goal is <130 fasting blood sugar and <180 two hours after meals., Disp: 1 kit, Rfl: 0 ??  gabapentin (NEURONTIN) 600 MG tablet, Take 0.5 tablets (300 mg total) by mouth at bedtime., Disp: 90 tablet, Rfl: 4 ??  glucose blood test strip, Use to check blood sugar 3 times daily, fasting in morning with goal <130 and 2 hours after meals with goal <180.  Bring blood sugar log to visits., Disp: 100 each, Rfl: 12 ??  meloxicam (MOBIC) 15 MG tablet, Take 1 tablet (15 mg total) by mouth daily., Disp: 90 tablet, Rfl: 4 ??  metFORMIN (GLUCOPHAGE) 500 MG tablet, Take 1 tablet (500 mg total) by mouth 2 (two) times daily with a meal., Disp: 180 tablet, Rfl: 4 ??  omeprazole (PRILOSEC) 40 MG capsule, Take 1 capsule (40 mg total) by mouth daily., Disp: 30 capsule, Rfl: 3 ??  OneTouch Delica Lancets 14G MISC, To check blood sugar prior to eating in morning with goal of less then 130.  Document for visits., Disp: 100 each, Rfl: 4 ??  tiZANidine (ZANAFLEX) 4 MG tablet, Take 1 tablet (4 mg total) by mouth every 6 (six) hours as needed for muscle spasms., Disp: 90 tablet, Rfl: 4  ? ? ?Assessment & Plan:  ?GERD continue pantoprazole 40 mg q.day as prescribed. As had a stress test ~ 7  - 8 yrs ago. ?patient advised to avoid laying down soon after his meals. He took a 2 hours between dinner and bedtime. Avoid spicy food and triggers that he knows food wise that worsen his acid reflux. Patient verbalized understanding of the above. Lifestyle modifications as above discussed with patient.  ?Will need to get an ekg today.   ?Problem List Items Addressed This Visit   ? ?  ? Digestive  ? Gastroesophageal reflux disease  without esophagitis  ? Relevant Medications  ? omeprazole (PRILOSEC) 40 MG capsule  ?  ? Other  ? Chest pain - Primary  ? Relevant Orders  ? EKG 12-Lead (Completed)  ?  ? ?Orders Placed This Encounter  ?Procedures  ?? EKG 12-Lead  ?  ? ?Meds ordered this encounter  ?Medications  ?? omeprazole (PRILOSEC) 40 MG capsule  ?  Sig: Take 1 capsule (40 mg total) by mouth daily.  ?  Dispense:  30 capsule  ?  Refill:  3  ?  ? ?Follow up plan: ?Return in about 1 week (around 10/17/2021). ? ? ?

## 2021-10-14 ENCOUNTER — Other Ambulatory Visit: Payer: Self-pay | Admitting: Internal Medicine

## 2021-10-14 ENCOUNTER — Telehealth: Payer: Self-pay

## 2021-10-14 NOTE — Telephone Encounter (Signed)
Third attempted to contact [facility] and unable to speak with case worker regarding provider's requested.  ?

## 2021-10-14 NOTE — Telephone Encounter (Signed)
-----   Message from Jolene T [name], NP sent at 10/07/2021  2:36 PM EDT ----- ?Please call Worker's Comp # [phone] = [facility] and find out how and what we need to do to order physical therapy for this patient covered by his workmen's comp.  Let me know when find out.:) ?

## 2021-10-15 NOTE — Telephone Encounter (Signed)
LRF [id number]. Receipt confirmed by pharmacy 5.5.23  At 3:24 ?Sent via Interface ?Requested Prescriptions  ?Pending Prescriptions Disp Refills  ?? omeprazole (PRILOSEC) 40 MG capsule [Pharmacy Med Name: OMEPRAZOLE DR 40 MG CAPSULE] 90 capsule 1  ?  Sig: TAKE 1 CAPSULE (40 MG TOTAL) BY MOUTH DAILY.  ?  ? Gastroenterology: Proton Pump Inhibitors Passed - 10/14/2021  4:51 AM  ?  ?  Passed - Valid encounter within last 12 months  ?  Recent Outpatient Visits   ?      ? 5 days ago Chest pain, unspecified type  ? [facility] Vigg, [place], MD  ? 1 week ago Chronic bilateral low back pain with bilateral sciatica  ? [facility] [place], [place] T, NP  ? 3 months ago Chronic bilateral low back pain with bilateral sciatica  ? [facility] Ronald, [place] T, NP  ? 5 months ago Type 2 diabetes mellitus with obesity (HCC)  ? [facility] [place], [place] T, NP  ? 5 months ago Need for influenza vaccination  ? [facility] [place], [place], [place]  ?  ?  ?Future Appointments   ?        ? In 5 days [name], NP [facility], [facility]  ? In 1 month [name], Dorie Rank, NP [facility], [facility]  ? In 3 months [name], Dorie Rank, NP [facility], [facility]  ?  ? ?  ?  ?  ? ? ?

## 2021-10-18 NOTE — Patient Instructions (Signed)
Chronic Back Pain When back pain lasts longer than 3 months, it is called chronic back pain. Pain may get worse at certain times (flare-ups). There are things you can do at home to manage your pain. Follow these instructions at home: Pay attention to any changes in your symptoms. Take these actions to help with your pain: Managing pain and stiffness     If told, put ice on the painful area. Your doctor may tell you to use ice for 24-48 hours after the flare-up starts. To do this: Put ice in a plastic bag. Place a towel between your skin and the bag. Leave the ice on for 20 minutes, 2-3 times a day. If told, put heat on the painful area. Do this as often as told by your doctor. Use the heat source that your doctor recommends, such as a moist heat pack or a heating pad. Place a towel between your skin and the heat source. Leave the heat on for 20-30 minutes. Take off the heat if your skin turns bright red. This is especially important if you are unable to feel pain, heat, or cold. You may have a greater risk of getting burned. Soak in a warm bath. This can help relieve pain. Activity  Avoid bending and other activities that make pain worse. When standing: Keep your upper back and neck straight. Keep your shoulders pulled back. Avoid slouching. When sitting: Keep your back straight. Relax your shoulders. Do not round your shoulders or pull them backward. Do not sit or stand in one place for long periods of time. Take short rest breaks during the day. Lying down or standing is usually better than sitting. Resting can help relieve pain. When sitting or lying down for a long time, do some mild activity or stretching. This will help to prevent stiffness and pain. Get regular exercise. Ask your doctor what activities are safe for you. Do not lift anything that is heavier than 10 lb (4.5 kg) or the limit that you are told, until your doctor says that it is safe. To prevent injury when you lift  things: Bend your knees. Keep the weight close to your body. Avoid twisting. Sleep on a firm mattress. Try lying on your side with your knees slightly bent. If you lie on your back, put a pillow under your knees. Medicines Treatment may include medicines for pain and swelling taken by mouth or put on the skin, prescription pain medicine, or muscle relaxants. Take over-the-counter and prescription medicines only as told by your doctor. Ask your doctor if the medicine prescribed to you: Requires you to avoid driving or using machinery. Can cause trouble pooping (constipation). You may need to take these actions to prevent or treat trouble pooping: Drink enough fluid to keep your pee (urine) pale yellow. Take over-the-counter or prescription medicines. Eat foods that are high in fiber. These include beans, whole grains, and fresh fruits and vegetables. Limit foods that are high in fat and sugars. These include fried or sweet foods. General instructions Do not use any products that contain nicotine or tobacco, such as cigarettes, e-cigarettes, and chewing tobacco. If you need help quitting, ask your doctor. Keep all follow-up visits as told by your doctor. This is important. Contact a doctor if: Your pain does not get better with rest or medicine. Your pain gets worse, or you have new pain. You have a high fever. You lose weight very quickly. You have trouble doing your normal activities. Get help right away   if: One or both of your legs or feet feel weak. One or both of your legs or feet lose feeling (have numbness). You have trouble controlling when you poop (have a bowel movement) or pee (urinate). You have bad back pain and: You feel like you may vomit (nauseous), or you vomit. You have pain in your belly (abdomen). You have shortness of breath. You faint. Summary When back pain lasts longer than 3 months, it is called chronic back pain. Pain may get worse at certain times  (flare-ups). Use ice and heat as told by your doctor. Your doctor may tell you to use ice after flare-ups. This information is not intended to replace advice given to you by your health care provider. Make sure you discuss any questions you have with your health care provider. Document Revised: 07/05/2019 Document Reviewed: 07/05/2019 Elsevier Patient Education  2023 [employer] Inc.  

## 2021-10-20 ENCOUNTER — Ambulatory Visit (INDEPENDENT_AMBULATORY_CARE_PROVIDER_SITE_OTHER): Payer: Medicare Other | Admitting: Nurse Practitioner

## 2021-10-20 ENCOUNTER — Encounter: Payer: Self-pay | Admitting: Nurse Practitioner

## 2021-10-20 VITALS — BP 128/76 | HR 78 | Temp 97.8°F | Ht 66.0 in | Wt 200.4 lb

## 2021-10-20 DIAGNOSIS — G8929 Other chronic pain: Secondary | ICD-10-CM

## 2021-10-20 DIAGNOSIS — M5442 Lumbago with sciatica, left side: Secondary | ICD-10-CM | POA: Diagnosis not present

## 2021-10-20 DIAGNOSIS — R079 Chest pain, unspecified: Secondary | ICD-10-CM

## 2021-10-20 DIAGNOSIS — M5441 Lumbago with sciatica, right side: Secondary | ICD-10-CM | POA: Diagnosis not present

## 2021-10-20 MED ORDER — MELOXICAM 15 MG PO TABS: 15.0000 mg | ORAL_TABLET | Freq: Every day | ORAL | 4 refills | Status: DC

## 2021-10-20 NOTE — Progress Notes (Signed)
? ?BP 128/76   Pulse 78   Temp 97.8 ?F (36.6 ?C) (Oral)   Ht 5' 6"  (1.676 m)   Wt 200 lb 6.4 oz (90.9 kg)   SpO2 97%   BMI 32.35 kg/m?   ? ?Subjective:  ? ? Patient ID: FERRON ISHMAEL, male    DOB: 05/10/1951, 71 y.o.   MRN: [id number] ? ?HPI: ?MARTAVIS GURNEY is a 71 y.o. male ? ?Chief Complaint  ?Patient presents with  ? Gastroesophageal Reflux  ? Chest Pain  ?  Patient is here for 1 week follow up on Chest Pain. Patient says when he twist or turn to the left side or if patient is laying down at a incline, is when it will hurt a little. Patient states with movement is when he has the pain and discomfort and if still he does not feel the pain. Patient states this has been an ongoing issue within muscular pain. Patient states he first noticed it about 3 weeks ago.   ? Medication Management  ?  Patient is saying he does not feeling any effects from the prescription from Zanaflex, but when he takes the Meloxicam he feels better as far as to help. Patient states the Meloxicam helps him sleep and helps with the relaxing. Patient requesting a new prescription for Meloxicam as his current Rx is expired.   ? ?BACK PAIN  ?Seen for muscular pain with presentation to chest area about one week ago, reports this is common with his ongoing back pain as it radiates around in shoulders at times. Not currently getting physical therapy -- have attempted to call the person with worker's comp to get this covered and been unable to get in touch.  Notices discomfort from neck into left and right shoulder, which radiates to chest -- ongoing issue.  Was given Gabapentin last visit, took once but has not taken again as did not know how to take.   ? ?Chronic back issues since work accident in 1988 or 1989.  During accident he was pulling a deck and it got broken, then pulled back during movement + has h/o falling from height during work.  Did go to physical therapy for period of time, which was beneficial.  Last imaging was in  2016. ? ?Has Tizanidine and Meloxicam at home.  Is taking one Meloxicam at times as needed -- takes Tizanidine at night only. When he is working he wears back support.   ?Duration: months ?Mechanism of injury: work accident (chronic)  ?Location: low back ?Onset: chronic ?Severity: 4-5/10 ?Quality: dull and burning across lower back ?Frequency: constant ?Radiation: none ?Aggravating factors: lifting and prolonged sitting ?Alleviating factors:  muscle relaxer and Meloxicam ?Status: fluctuating ?Treatments attempted: Flexeril and medications noted above ?Relief with NSAIDs?: moderate ?Nighttime pain:  yes ?Paresthesias / decreased sensation:  no ?Bowel / bladder incontinence:  no ?Fevers: none ?Dysuria / urinary frequency:  no ? ?Relevant past medical, surgical, family and social history reviewed and updated as indicated. Interim medical history since our last visit reviewed. ?Allergies and medications reviewed and updated. ? ?Review of Systems  ?Constitutional:  Negative for activity change, diaphoresis, fatigue and fever.  ?Respiratory:  Negative for cough, chest tightness, shortness of breath and wheezing.   ?Cardiovascular:  Negative for chest pain, palpitations and leg swelling.  ?Gastrointestinal: Negative.   ?Musculoskeletal:  Positive for back pain.  ?Skin: Negative.   ?Neurological: Negative.   ?Psychiatric/Behavioral: Negative.    ? ?Per HPI unless specifically indicated above ? ?   ?  Objective:  ?  ?BP 128/76   Pulse 78   Temp 97.8 ?F (36.6 ?C) (Oral)   Ht 5' 6"  (1.676 m)   Wt 200 lb 6.4 oz (90.9 kg)   SpO2 97%   BMI 32.35 kg/m?   ?Wt Readings from Last 3 Encounters:  ?10/20/21 200 lb 6.4 oz (90.9 kg)  ?10/10/21 199 lb 3.2 oz (90.4 kg)  ?10/07/21 199 lb 3.2 oz (90.4 kg)  ?  ?Physical Exam ?Vitals and nursing note reviewed.  ?Constitutional:   ?   General: He is not in acute distress. ?   Appearance: He is well-developed. He is not ill-appearing.  ?HENT:  ?   Head: Normocephalic and atraumatic.  ?    Right Ear: Hearing normal. No drainage.  ?   Left Ear: Hearing normal. No drainage.  ?   Mouth/Throat:  ?   Pharynx: Uvula midline.  ?Eyes:  ?   General: Lids are normal.     ?   Right eye: No discharge.     ?   Left eye: No discharge.  ?   Conjunctiva/sclera: Conjunctivae normal.  ?   Pupils: Pupils are equal, round, and reactive to light.  ?Neck:  ?   Thyroid: No thyromegaly.  ?   Vascular: No carotid bruit or JVD.  ?   Trachea: Trachea normal.  ?Cardiovascular:  ?   Rate and Rhythm: Normal rate and regular rhythm.  ?   Heart sounds: Normal heart sounds, S1 normal and S2 normal. No murmur heard. ?  No gallop.  ?Pulmonary:  ?   Effort: Pulmonary effort is normal. No accessory muscle usage or respiratory distress.  ?   Breath sounds: Normal breath sounds.  ?Abdominal:  ?   General: Bowel sounds are normal.  ?   Palpations: Abdomen is soft. There is no hepatomegaly or splenomegaly.  ?Musculoskeletal:  ?   Cervical back: Normal range of motion and neck supple.  ?   Lumbar back: Tenderness present. No swelling, edema or lacerations. Decreased range of motion.  ?   Right lower leg: No edema.  ?   Left lower leg: No edema.  ?   Comments: Mild tenderness on palpation lower back.  Decreased flexion/extension/lateral ROM.  Negative straight leg.  ?Skin: ?   General: Skin is warm and dry.  ?   Capillary Refill: Capillary refill takes less than 2 seconds.  ?   Findings: No rash.  ?Neurological:  ?   Mental Status: He is alert and oriented to person, place, and time.  ?   Deep Tendon Reflexes: Reflexes are normal and symmetric.  ?Psychiatric:     ?   Mood and Affect: Mood normal.     ?   Behavior: Behavior normal.     ?   Thought Content: Thought content normal.     ?   Judgment: Judgment normal.  ? ?Results for orders placed or performed in visit on 05/09/21  ?Basic metabolic panel  ?Result Value Ref Range  ? Glucose 100 (H) 70 - 99 mg/dL  ? BUN 9 8 - 27 mg/dL  ? Creatinine, Ser 0.73 (L) 0.76 - 1.27 mg/dL  ? eGFR 98 >59  mL/min/1.73  ? BUN/Creatinine Ratio 12 10 - 24  ? Sodium 139 134 - 144 mmol/L  ? Potassium 4.2 3.5 - 5.2 mmol/L  ? Chloride 100 96 - 106 mmol/L  ? CO2 23 20 - 29 mmol/L  ? Calcium 9.5 8.6 - 10.2 mg/dL  ?Bayer [facility]  Hb A1c Waived  ?Result Value Ref Range  ? HB A1C (BAYER DCA - WAIVED) 6.4 (H) 4.8 - 5.6 %  ?Microalbumin, Urine Waived  ?Result Value Ref Range  ? Microalb, Ur Waived 10 0 - 19 mg/L  ? Creatinine, Urine Waived 200 10 - 300 mg/dL  ? Microalb/Creat Ratio <30 <30 mg/g  ?Testosterone, free, total([facility])  ?Result Value Ref Range  ? Testosterone 395 264 - 916 ng/dL  ? Testosterone, Free 11.8 6.6 - 18.1 pg/mL  ? Sex Hormone Binding 33.0 19.3 - 76.4 nmol/L  ? ?   ?Assessment & Plan:  ? ?Problem List Items Addressed This Visit   ? ?  ? Other  ? Chest pain  ?  Improved with Meloxicam and Gabapentin, radiates from his neck pain into shoulders and chest.  Order for PT placed. ? ?  ?  ? Low back pain - Primary  ?  Chronic at baseline.  Continue -- Tizanidine due to age > 79 and Meloxicam sent.  Continue Gabapentin 300 MG QHS and may increase to 600 MG if needed in upcoming weeks, educated him on this medication and use for pain.  Recommend alternating heat/ice + placing Voltaren gel on back at home.  Continue to rest and avoid lifting until improved.  Work on obtaining PT via workmen's comp will have staff call Worker's Comp # [phone] = [name] and find out what is needed. For now PT referral placed and will see if [facility] Medicare will cover as PT offered him most benefit. ? ?  ?  ? Relevant Medications  ? meloxicam (MOBIC) 15 MG tablet  ? Other Relevant Orders  ? Ambulatory referral to Physical Therapy  ?  ? ?Follow up plan: ?Return for as scheduled 11/17/21. ?

## 2021-10-20 NOTE — Assessment & Plan Note (Signed)
Improved with Meloxicam and Gabapentin, radiates from his neck pain into shoulders and chest.  Order for PT placed. ?

## 2021-10-20 NOTE — Assessment & Plan Note (Signed)
Chronic at baseline.  Continue -- Tizanidine due to age > 29 and Meloxicam sent.  Continue Gabapentin 300 MG QHS and may increase to 600 MG if needed in upcoming weeks, educated him on this medication and use for pain.  Recommend alternating heat/ice + placing Voltaren gel on back at home.  Continue to rest and avoid lifting until improved.  Work on obtaining PT via workmen's comp will have staff call Worker's Comp # [phone] = Amanda Pea and find out what is needed. For now PT referral placed and will see if [facility] Medicare will cover as PT offered him most benefit. ?

## 2021-10-21 ENCOUNTER — Telehealth: Payer: Self-pay | Admitting: Nurse Practitioner

## 2021-10-21 NOTE — Telephone Encounter (Signed)
Copied from CRM [id number]. Topic: General - Other ?>> Oct 21, 2021  2:26 PM Gaetana Michaelis A wrote: ?Reason for CRM: French Ana with [facility] would like to know if the lower back concerns that the patient is being seen for are related to a Workman's Comp claim  ? ?Please contact further when possible ?

## 2021-10-21 NOTE — Telephone Encounter (Signed)
Attempted to call [facility] back to make them aware of this. Will try to call them again at a later time. ?

## 2021-10-22 NOTE — Telephone Encounter (Signed)
2nd attempt to call back

## 2021-10-24 NOTE — Telephone Encounter (Signed)
Contacted [facility] and advised WC is no longer covering patient for physical therapy.

## 2021-10-30 ENCOUNTER — Ambulatory Visit: Payer: Medicare Other

## 2021-11-07 ENCOUNTER — Ambulatory Visit: Payer: Medicare Other | Admitting: Nurse Practitioner

## 2021-11-12 NOTE — Patient Instructions (Incomplete)

## 2021-11-17 ENCOUNTER — Ambulatory Visit: Payer: Medicare Other | Admitting: Nurse Practitioner

## 2021-11-17 DIAGNOSIS — E6609 Other obesity due to excess calories: Secondary | ICD-10-CM

## 2021-11-17 DIAGNOSIS — R748 Abnormal levels of other serum enzymes: Secondary | ICD-10-CM

## 2021-11-17 DIAGNOSIS — E1169 Type 2 diabetes mellitus with other specified complication: Secondary | ICD-10-CM

## 2021-11-17 DIAGNOSIS — G8929 Other chronic pain: Secondary | ICD-10-CM

## 2021-11-21 ENCOUNTER — Ambulatory Visit (INDEPENDENT_AMBULATORY_CARE_PROVIDER_SITE_OTHER): Payer: Medicare Other | Admitting: Nurse Practitioner

## 2021-11-21 ENCOUNTER — Encounter: Payer: Self-pay | Admitting: Nurse Practitioner

## 2021-11-21 VITALS — BP 126/80 | HR 71 | Temp 98.5°F | Ht 66.0 in | Wt 201.8 lb

## 2021-11-21 DIAGNOSIS — G8929 Other chronic pain: Secondary | ICD-10-CM

## 2021-11-21 DIAGNOSIS — K219 Gastro-esophageal reflux disease without esophagitis: Secondary | ICD-10-CM | POA: Diagnosis not present

## 2021-11-21 DIAGNOSIS — E6609 Other obesity due to excess calories: Secondary | ICD-10-CM

## 2021-11-21 DIAGNOSIS — M5441 Lumbago with sciatica, right side: Secondary | ICD-10-CM | POA: Diagnosis not present

## 2021-11-21 DIAGNOSIS — E785 Hyperlipidemia, unspecified: Secondary | ICD-10-CM | POA: Diagnosis not present

## 2021-11-21 DIAGNOSIS — Z6832 Body mass index (BMI) 32.0-32.9, adult: Secondary | ICD-10-CM

## 2021-11-21 DIAGNOSIS — M5442 Lumbago with sciatica, left side: Secondary | ICD-10-CM

## 2021-11-21 DIAGNOSIS — E1169 Type 2 diabetes mellitus with other specified complication: Secondary | ICD-10-CM

## 2021-11-21 DIAGNOSIS — E669 Obesity, unspecified: Secondary | ICD-10-CM

## 2021-11-21 LAB — BAYER DCA HB A1C WAIVED: HB A1C (BAYER DCA - WAIVED): 6.9 % — ABNORMAL HIGH (ref 4.8–5.6)

## 2021-11-21 LAB — MICROALBUMIN, URINE WAIVED
Creatinine, Urine Waived: 100 mg/dL (ref 10–300)
Microalb, Ur Waived: 30 mg/L — ABNORMAL HIGH (ref 0–19)
Microalb/Creat Ratio: <30 mg/g (ref <30)

## 2021-11-21 MED ORDER — CARBOXYMETHYLCELLULOSE SODIUM 0.5 % OP SOLN: 1.0000 [drp] | Freq: Every day | OPHTHALMIC | 2 refills | Status: DC | PRN

## 2021-11-21 MED ORDER — PREDNISONE 10 MG PO TABS: ORAL_TABLET | ORAL | 0 refills | Status: DC

## 2021-11-21 MED ORDER — DICLOFENAC POTASSIUM 50 MG PO TABS: 50.0000 mg | ORAL_TABLET | Freq: Every day | ORAL | 12 refills | Status: DC | PRN

## 2021-11-21 NOTE — Assessment & Plan Note (Signed)
BMI 32.57.  Recommended eating smaller high protein, low fat meals more frequently and exercising 30 mins a day 5 times a week with a goal of 10-15lb weight loss in the next 3 months. Patient voiced their understanding and motivation to adhere to these recommendations.  

## 2021-11-21 NOTE — Assessment & Plan Note (Signed)
Chronic at baseline. Continue Gabapentin 300 MG QHS and may increase to 600 MG if needed, educated him on this medication and use for pain. STOP Meloxicam and start Diclofenac which works better for him.   Recommend alternating heat/ice + placing Voltaren gel on back at home.  Continue to rest and avoid lifting until improved.  Work on obtaining PT via workmen's comp will have staff call Worker's Comp # [phone] = Amanda Pea and find out what is needed. For now PT referral placed and will see if [facility] Medicare will cover as PT offered him most benefit.

## 2021-11-21 NOTE — Assessment & Plan Note (Signed)
Diagnosed in August 2022, at this time A1c continues to be stable at 6.9%, slight trend up from previous of 6.4%, and urine ALB 30 (June 2023).  Continue Metformin 500 MG BID and consider addition of ACE/ARB in future -- although would monitor closely with his current stable BP -- will further discuss with him next visit.  Recommend he check BS at home a few mornings a week with goal <130.  CMP today. Return in 3 months.

## 2021-11-21 NOTE — Assessment & Plan Note (Signed)
Chronic, ongoing.  Continue current medication regimen and adjust as needed. Lipid panel today. 

## 2021-11-21 NOTE — Progress Notes (Signed)
BP 126/80   Pulse 71   Temp 98.5 F (36.9 C) (Oral)   Ht 5' 6"  (1.676 m)   Wt 201 lb 12.8 oz (91.5 kg)   SpO2 92%   BMI 32.57 kg/m    Subjective:    Patient ID: Dale Morton, male    DOB: 1951/02/11, 71 y.o.   MRN: [id number]  HPI: Dale Morton is a 71 y.o. male  Chief Complaint  Patient presents with   Back Pain   Diabetes    Patient says he is scheduled for an upcoming Diabetic Eye Exam.    Hyperlipidemia   Shoulder Pain    Patient states he is having pain in the back of his head radiating down into his shoulders. Patient says he first noticed a few days ago. Patient says he has bought over counter medicine to help and it does not help pain except for a little bit and the pain returns. Patient states he feels it the most when he is laying down or his head is laid backwards.    Medication Management    Patient states he has stopped taking the muscle relaxer's Meloxicam and Zanaflex. Patient states he feels a burning sensation when he takes the medication. Patient states that it is instructed twice a day and feels that may be too much.    Referral    Patient is requesting a referral for an eye doctor that would help him and not be as expensive for the patient. Patient states he did they free exam before here within in the last year.    DIABETES Diagnosed in August 2022 with A1c 6.9%, recent A1c 6.4% in December 2022.  Continues Metformin 500 MG BID and Atorvastatin 10 MG daily for HLD. Hypoglycemic episodes:no Polydipsia/polyuria: no Visual disturbance: no Chest pain: no Paresthesias: no Glucose Monitoring: no  Accucheck frequency: Not Checking  Fasting glucose:  Post prandial:  Evening:  Before meals: Taking Insulin?: no  Long acting insulin:  Short acting insulin: Blood Pressure Monitoring: not checking Retinal Examination: Not Up To Date -- would like referral Foot Exam: Up to Date Pneumovax: Up to Date Influenza: Up to Date Aspirin: no  The 10-year ASCVD  risk score (Arnett DK, et al., 2019) is: 26.6%   Values used to calculate the score:     Age: 69 years     Sex: Male     Is Non-Hispanic African American: No     Diabetic: Yes     Tobacco smoker: No     Systolic Blood Pressure: 846 mmHg     Is BP treated: No     HDL Cholesterol: 43 mg/dL     Total Cholesterol: 143 mg/dL  BACK & SHOULDER PAIN  Currently more acute pain present in shoulders/neck/back (flare).  Chronic at baseline, since work accident in 58 or 1989.  During accident he was pulling a deck and it got broken, then pulled back during movement + has h/o falling from height during work.  Did go to physical therapy for period of time, which was beneficial -- however has not been able to get back into this.   Has been using Tizanidine and Meloxicam at home, but reports these have been causing stomach discomfort, but on review of medications he is taking Dolo Neurobion from [place] (Diclofenac and B12 mix on review of ingredients).  Discussed with him taking both Meloxiam and Dolo can cause stomach bleeds, he feels the Diclofenac works better and would prefer this --  would stop Meloxicam.  When he is doing tasks he wears back support.   Duration: months Mechanism of injury: work accident (chronic)  Location: low back Onset: chronic Severity: 4-5/10 at worst and at best 2/10  Quality: dull and burning across lower back Frequency: intermittent Radiation: none Aggravating factors: lifting and prolonged sitting Alleviating factors:  muscle relaxer Status: fluctuating Treatments attempted: Flexeril  Relief with NSAIDs?: moderate Nighttime pain:  no Paresthesias / decreased sensation:  no Bowel / bladder incontinence:  no Fevers:  no Dysuria / urinary frequency:  no  Relevant past medical, surgical, family and social history reviewed and updated as indicated. Interim medical history since our last visit reviewed. Allergies and medications reviewed and updated.  Review of Systems   Constitutional:  Negative for activity change, diaphoresis, fatigue and fever.  Respiratory:  Negative for cough, chest tightness, shortness of breath and wheezing.   Cardiovascular:  Negative for chest pain, palpitations and leg swelling.  Gastrointestinal: Negative.   Musculoskeletal:  Positive for arthralgias and back pain.  Skin: Negative.   Neurological: Negative.   Psychiatric/Behavioral: Negative.     Per HPI unless specifically indicated above    Objective:    BP 126/80   Pulse 71   Temp 98.5 F (36.9 C) (Oral)   Ht 5' 6"  (1.676 m)   Wt 201 lb 12.8 oz (91.5 kg)   SpO2 92%   BMI 32.57 kg/m   Wt Readings from Last 3 Encounters:  11/21/21 201 lb 12.8 oz (91.5 kg)  10/20/21 200 lb 6.4 oz (90.9 kg)  10/10/21 199 lb 3.2 oz (90.4 kg)    Physical Exam Vitals and nursing note reviewed.  Constitutional:      General: He is not in acute distress.    Appearance: He is well-developed. He is not ill-appearing.  HENT:     Head: Normocephalic and atraumatic.     Right Ear: Hearing normal. No drainage.     Left Ear: Hearing normal. No drainage.     Mouth/Throat:     Pharynx: Uvula midline.  Eyes:     General: Lids are normal.        Right eye: No discharge.        Left eye: No discharge.     Conjunctiva/sclera: Conjunctivae normal.     Pupils: Pupils are equal, round, and reactive to light.  Neck:     Thyroid: No thyromegaly.     Vascular: No carotid bruit or JVD.     Trachea: Trachea normal.  Cardiovascular:     Rate and Rhythm: Normal rate and regular rhythm.     Heart sounds: Normal heart sounds, S1 normal and S2 normal. No murmur heard.    No gallop.  Pulmonary:     Effort: Pulmonary effort is normal. No accessory muscle usage or respiratory distress.     Breath sounds: Normal breath sounds.  Abdominal:     General: Bowel sounds are normal.     Palpations: Abdomen is soft. There is no hepatomegaly or splenomegaly.  Musculoskeletal:     Cervical back: Normal  range of motion and neck supple.     Lumbar back: Tenderness present. No swelling, edema or lacerations. Decreased range of motion.     Right lower leg: No edema.     Left lower leg: No edema.     Comments: Mild tenderness on palpation lower back.  Decreased flexion/extension/lateral ROM.  Negative straight leg.  Skin:    General: Skin is warm and dry.  Capillary Refill: Capillary refill takes less than 2 seconds.  Neurological:     Mental Status: He is alert and oriented to person, place, and time.     Deep Tendon Reflexes: Reflexes are normal and symmetric.  Psychiatric:        Mood and Affect: Mood normal.        Behavior: Behavior normal.        Thought Content: Thought content normal.        Judgment: Judgment normal.    Results for orders placed or performed in visit on 57/26/20  Basic metabolic panel  Result Value Ref Range   Glucose 100 (H) 70 - 99 mg/dL   BUN 9 8 - 27 mg/dL   Creatinine, Ser 0.73 (L) 0.76 - 1.27 mg/dL   eGFR 98 >59 mL/min/1.73   BUN/Creatinine Ratio 12 10 - 24   Sodium 139 134 - 144 mmol/L   Potassium 4.2 3.5 - 5.2 mmol/L   Chloride 100 96 - 106 mmol/L   CO2 23 20 - 29 mmol/L   Calcium 9.5 8.6 - 10.2 mg/dL  Bayer DCA Hb A1c Waived  Result Value Ref Range   HB A1C (BAYER DCA - WAIVED) 6.4 (H) 4.8 - 5.6 %  Microalbumin, Urine Waived  Result Value Ref Range   Microalb, Ur Waived 10 0 - 19 mg/L   Creatinine, Urine Waived 200 10 - 300 mg/dL   Microalb/Creat Ratio <30 <30 mg/g  Testosterone, free, total([facility])  Result Value Ref Range   Testosterone 395 264 - 916 ng/dL   Testosterone, Free 11.8 6.6 - 18.1 pg/mL   Sex Hormone Binding 33.0 19.3 - 76.4 nmol/L      Assessment & Plan:   Problem List Items Addressed This Visit       Digestive   Gastroesophageal reflux disease without esophagitis    Chronic, ongoing.  At this time continue Omeprazole and recommend he not take both Meloxicam and Dolo due to GI bleed risk.  He will stop  Meloxicam.  Monitor closely and reassess next visit.        Endocrine   Hyperlipidemia associated with type 2 diabetes mellitus (HCC)    Chronic, ongoing.  Continue current medication regimen and adjust as needed. Lipid panel today.       Relevant Orders   Bayer [place] Hb A1c Waived   Comprehensive metabolic panel   Lipid Panel w/o Chol/HDL Ratio   Type 2 diabetes mellitus with obesity ([place]) - Primary    Diagnosed in August 2022, at this time A1c continues to be stable at 6.9%, slight trend up from previous of 6.4%, and urine ALB 30 (June 2023).  Continue Metformin 500 MG BID and consider addition of ACE/ARB in future -- although would monitor closely with his current stable BP -- will further discuss with him next visit.  Recommend he check BS at home a few mornings a week with goal <130.  CMP today. Return in 3 months.      Relevant Orders   Bayer DCA Hb A1c Waived   Microalbumin, Urine Waived   Ambulatory referral to Ophthalmology     Other   Low back pain    Chronic at baseline. Continue Gabapentin 300 MG QHS and may increase to 600 MG if needed, educated him on this medication and use for pain. STOP Meloxicam and start Diclofenac which works better for him.   Recommend alternating heat/ice + placing Voltaren gel on back at home.  Continue to rest and  avoid lifting until improved.  Work on obtaining PT via workmen's comp will have staff call Worker's Comp # [phone] = [name] and find out what is needed. For now PT referral placed and will see if [facility] Medicare will cover as PT offered him most benefit.      Relevant Medications   diclofenac (CATAFLAM) 50 MG tablet   predniSONE (DELTASONE) 10 MG tablet   Obesity    BMI 32.57.  Recommended eating smaller high protein, low fat meals more frequently and exercising 30 mins a day 5 times a week with a goal of 10-15lb weight loss in the next 3 months. Patient voiced their understanding and motivation to adhere to these  recommendations.         Follow up plan: Return in about 3 months (around 02/21/2022) for T2DM, HTN/HLD, BACK PAIN, GERD.

## 2021-11-21 NOTE — Assessment & Plan Note (Signed)
Chronic, ongoing.  At this time continue Omeprazole and recommend he not take both Meloxicam and Dolo due to GI bleed risk.  He will stop Meloxicam.  Monitor closely and reassess next visit.

## 2021-11-21 NOTE — Patient Instructions (Signed)

## 2021-11-22 ENCOUNTER — Other Ambulatory Visit: Payer: Self-pay | Admitting: Nurse Practitioner

## 2021-11-22 LAB — COMPREHENSIVE METABOLIC PANEL
ALT: 33 IU/L (ref 0–44)
AST: 26 IU/L (ref 0–40)
Albumin/Globulin Ratio: 1.8 (ref 1.2–2.2)
Albumin: 5.1 g/dL — ABNORMAL HIGH (ref 3.8–4.8)
Alkaline Phosphatase: 123 IU/L — ABNORMAL HIGH (ref 44–121)
BUN/Creatinine Ratio: 12 (ref 10–24)
BUN: 10 mg/dL (ref 8–27)
Bilirubin Total: 0.6 mg/dL (ref 0.0–1.2)
CO2: 24 mmol/L (ref 20–29)
Calcium: 9.6 mg/dL (ref 8.6–10.2)
Chloride: 99 mmol/L (ref 96–106)
Creatinine, Ser: 0.84 mg/dL (ref 0.76–1.27)
Globulin, Total: 2.9 g/dL (ref 1.5–4.5)
Glucose: 110 mg/dL — ABNORMAL HIGH (ref 70–99)
Potassium: 4.2 mmol/L (ref 3.5–5.2)
Sodium: 139 mmol/L (ref 134–144)
Total Protein: 8 g/dL (ref 6.0–8.5)
eGFR: 94 mL/min/{1.73_m2} (ref >59)

## 2021-11-22 LAB — LIPID PANEL W/O CHOL/HDL RATIO
Cholesterol, Total: 176 mg/dL (ref 100–199)
HDL: 37 mg/dL — ABNORMAL LOW (ref >39)
LDL Chol Calc (NIH): 102 mg/dL — ABNORMAL HIGH (ref 0–99)
Triglycerides: 215 mg/dL — ABNORMAL HIGH (ref 0–149)
VLDL Cholesterol Cal: 37 mg/dL (ref 5–40)

## 2021-11-22 MED ORDER — ATORVASTATIN CALCIUM 20 MG PO TABS: 20.0000 mg | ORAL_TABLET | Freq: Every day | ORAL | 4 refills | Status: DC

## 2021-11-22 NOTE — Progress Notes (Signed)
Good morning crew, please let Dale Morton know his labs have returned.  Kidney and liver function remain stable.  Cholesterol levels are above goal.  I am going to increase his Atorvastatin to 20 MG daily, if he has 10 MG tablets left complete these by taking 2 a day then start the 20 MG prescription I am sending in.  Any questions?  This change is to help prevent stroke or heart attack with his diabetes. Keep being amazing!!  Thank you for allowing me to participate in your care.  I appreciate you. Kindest regards, Noemy Hallmon

## 2021-12-22 LAB — HM DIABETES EYE EXAM

## 2022-01-05 ENCOUNTER — Other Ambulatory Visit: Payer: Self-pay

## 2022-01-05 MED ORDER — OMEPRAZOLE 40 MG PO CPDR
40.0000 mg | DELAYED_RELEASE_CAPSULE | Freq: Every day | ORAL | 3 refills | Status: DC
Start: 2022-01-05 — End: 2022-02-24

## 2022-01-05 NOTE — Telephone Encounter (Signed)
LOV 11/21/21  Future appt  02/02/22

## 2022-01-31 NOTE — Patient Instructions (Signed)
Diabetes mellitus y nutricin, en adultos Diabetes Mellitus and Nutrition, Adult Si sufre de diabetes, o diabetes mellitus, es muy importante tener hbitos alimenticios saludables debido a que sus niveles de azcar en la sangre (glucosa) se ven afectados en gran medida por lo que come y bebe. Comer alimentos saludables en las cantidades correctas, aproximadamente a la misma hora todos los das, lo ayudar a: Controlar su glucemia. Disminuir el riesgo de sufrir una enfermedad cardaca. Mejorar la presin arterial. Alcanzar o mantener un peso saludable. Qu puede afectar mi plan de alimentacin? Todas las personas que sufren de diabetes son diferentes y cada una tiene necesidades diferentes en cuanto a un plan de alimentacin. El mdico puede recomendarle que trabaje con un nutricionista para elaborar el mejor plan para usted. Su plan de alimentacin puede variar segn factores como: Las caloras que necesita. Los medicamentos que toma. Su peso. Sus niveles de glucemia, presin arterial y colesterol. Su nivel de actividad. Otras afecciones que tenga, como enfermedades cardacas o renales. Cmo me afectan los carbohidratos? Los carbohidratos, o hidratos de carbono, afectan su nivel de glucemia ms que cualquier otro tipo de alimento. La ingesta de carbohidratos aumenta la cantidad de glucosa en la sangre. Es importante conocer la cantidad de carbohidratos que se pueden ingerir en cada comida sin correr ningn riesgo. Esto es diferente en cada persona. Su nutricionista puede ayudarlo a calcular la cantidad de carbohidratos que debe ingerir en cada comida y en cada refrigerio. Cmo me afecta el alcohol? El alcohol puede provocar una disminucin de la glucemia (hipoglucemia), especialmente si usa insulina o toma determinados medicamentos por va oral para la diabetes. La hipoglucemia es una afeccin potencialmente mortal. Los sntomas de la hipoglucemia, como somnolencia, mareos y confusin, son  similares a los sntomas de haber consumido demasiado alcohol. No beba alcohol si: Su mdico le indica no hacerlo. Est embarazada, puede estar embarazada o est tratando de quedar embarazada. Si bebe alcohol: Limite la cantidad que bebe a lo siguiente: De 0 a 1 medida por da para las mujeres. De 0 a 2 medidas por da para los hombres. Sepa cunta cantidad de alcohol hay en las bebidas que toma. En [place], una medida equivale a una botella de cerveza de 12 oz (355 ml), un vaso de vino de 5 oz (148 ml) o un vaso de una bebida alcohlica de alta graduacin de 1 oz (44 ml). Mantngase hidratado bebiendo agua, refrescos dietticos o t helado sin azcar. Tenga en cuenta que los refrescos comunes, los jugos y otras bebidas para mezclar pueden contener mucha azcar y se deben contar como carbohidratos. Consejos para seguir este plan  Leer las etiquetas de los alimentos Comience por leer el tamao de la porcin en la etiqueta de Informacin nutricional de los alimentos envasados y las bebidas. La cantidad de caloras, carbohidratos, grasas y otros nutrientes detallados en la etiqueta se basan en una porcin del alimento. Muchos alimentos contienen ms de una porcin por envase. Verifique la cantidad total de gramos (g) de carbohidratos totales en una porcin. Verifique la cantidad de gramos de grasas saturadas y grasas trans en una porcin. Escoja alimentos que no contengan estas grasas o que su contenido de estas sea bajo. Verifique la cantidad de miligramos (mg) de sal (sodio) en una porcin. La mayora de las personas deben limitar la ingesta de sodio total a menos de 2300 mg por da. Siempre consulte la informacin nutricional de los alimentos etiquetados como "con bajo contenido de grasa" o "sin grasa".   Estos alimentos pueden tener un mayor contenido de azcar agregada o carbohidratos refinados, y deben evitarse. Hable con su nutricionista para identificar sus objetivos diarios en  cuanto a los nutrientes mencionados en la etiqueta. Al ir de compras Evite comprar alimentos procesados, enlatados o precocidos. Estos alimentos tienden a tener una mayor cantidad de grasa, sodio y azcar agregada. Compre en la zona exterior de la tienda de comestibles. Esta es la zona donde se encuentran con mayor frecuencia las frutas y las verduras frescas, los cereales a granel, las carnes frescas y los productos lcteos frescos. Al cocinar Use mtodos de coccin a baja temperatura, como hornear, en lugar de mtodos de coccin a alta temperatura, como frer en abundante aceite. Cocine con aceites saludables, como el aceite de oliva, canola o girasol. Evite cocinar con manteca, crema o carnes con alto contenido de grasa. Planificacin de las comidas Coma las comidas y los refrigerios regularmente, preferentemente a la misma hora todos los das. Evite pasar largos perodos de tiempo sin comer. Consuma alimentos ricos en fibra, como frutas frescas, verduras, frijoles y cereales integrales. Consuma entre 4 y 6 onzas (entre 112 y 168 g) de protenas magras por da, como carnes magras, pollo, pescado, huevos o tofu. Una onza (oz) (28 g) de protena magra equivale a: 1 onza (28 g) de carne, pollo o pescado. 1 huevo.  taza (62 g) de tofu. Coma algunos alimentos por da que contengan grasas saludables, como aguacates, frutos secos, semillas y pescado. Qu alimentos debo comer? Frutas Bayas. Manzanas. Naranjas. Duraznos. Damascos. Ciruelas. Uvas. Mangos. Papayas. Granadas. Kiwi. Cerezas. Verduras Verduras de hoja verde, que incluyen lechuga, espinaca, col rizada, acelga, hojas de berza, hojas de mostaza y repollo. Remolachas. Coliflor. Brcoli. Zanahorias. Judas verdes. Tomates. Pimientos. Cebollas. Pepinos. Coles de Bruselas. Granos Granos integrales, como panes, galletas, tortillas, cereales y pastas de salvado o integrales. Avena sin azcar. Quinua. Arroz integral o salvaje. Carnes y otras  protenas Frutos de mar. Carne de ave sin piel. Cortes magros de ave y carne de res. Tofu. Frutos secos. Semillas. Lcteos Productos lcteos sin grasa o con bajo contenido de grasa, como leche, yogur y queso. Es posible que los productos detallados arriba no constituyan una lista completa de los alimentos y las bebidas que puede tomar. Consulte a un nutricionista para obtener ms informacin. Qu alimentos debo evitar? Frutas Frutas enlatadas al almbar. Verduras Verduras enlatadas. Verduras congeladas con mantequilla o salsa de crema. Granos Productos elaborados con harina y harina blanca refinada, como panes, pastas, bocadillos y cereales. Evite todos los alimentos procesados. Carnes y otras protenas Cortes de carne con alto contenido de grasa. Carne de ave con piel. Carnes empanizadas o fritas. Carne procesada. Evite las grasas saturadas. Lcteos Yogur, queso o leche enteros. Bebidas Bebidas azucaradas, como gaseosas o t helado. Es posible que los productos que se enumeran ms arriba no constituyan una lista completa de los alimentos y las bebidas que debe evitar. Consulte a un nutricionista para obtener ms informacin. Preguntas para hacerle al mdico Debo consultar con un especialista certificado en atencin y educacin sobre la diabetes? Es necesario que me rena con un nutricionista? A qu nmero puedo llamar si tengo preguntas? Cules son los mejores momentos para controlar la glucemia? Dnde encontrar ms informacin: American Diabetes Association (Asociacin Estadounidense de la Diabetes): diabetes.org Academy of Nutrition and Dietetics (Academia de Nutricin y Diettica): eatright.org National Institute of Diabetes and Digestive and Kidney Diseases (Instituto Nacional de la Diabetes y las Enfermedades Digestivas y Renales): niddk.nih.gov Association of Diabetes   Care & Education Specialists (Asociacin de Especialistas en Atencin y Educacin sobre la Diabetes):  diabeteseducator.org Resumen Es importante tener hbitos alimenticios saludables debido a que sus niveles de azcar en la sangre (glucosa) se ven afectados en gran medida por lo que come y bebe. Es importante consumir alcohol con prudencia. Un plan de comidas saludable lo ayudar a controlar la glucosa en sangre y a reducir el riesgo de enfermedades cardacas. El mdico puede recomendarle que trabaje con un nutricionista para elaborar el mejor plan para usted. Esta informacin no tiene como fin reemplazar el consejo del mdico. Asegrese de hacerle al mdico cualquier pregunta que tenga. Document Revised: 01/31/2020 Document Reviewed: 01/31/2020 Elsevier Patient Education  2023 Elsevier Inc.  

## 2022-02-02 ENCOUNTER — Ambulatory Visit (INDEPENDENT_AMBULATORY_CARE_PROVIDER_SITE_OTHER): Payer: Medicare Other | Admitting: Nurse Practitioner

## 2022-02-02 ENCOUNTER — Encounter: Payer: Self-pay | Admitting: Nurse Practitioner

## 2022-02-02 VITALS — BP 120/65 | HR 72 | Temp 98.5°F | Ht 66.3 in | Wt 207.8 lb

## 2022-02-02 DIAGNOSIS — R748 Abnormal levels of other serum enzymes: Secondary | ICD-10-CM

## 2022-02-02 DIAGNOSIS — Z0000 Encounter for general adult medical examination without abnormal findings: Secondary | ICD-10-CM | POA: Diagnosis not present

## 2022-02-02 DIAGNOSIS — E785 Hyperlipidemia, unspecified: Secondary | ICD-10-CM

## 2022-02-02 DIAGNOSIS — K219 Gastro-esophageal reflux disease without esophagitis: Secondary | ICD-10-CM

## 2022-02-02 DIAGNOSIS — Z6832 Body mass index (BMI) 32.0-32.9, adult: Secondary | ICD-10-CM

## 2022-02-02 DIAGNOSIS — E6609 Other obesity due to excess calories: Secondary | ICD-10-CM

## 2022-02-02 DIAGNOSIS — N400 Enlarged prostate without lower urinary tract symptoms: Secondary | ICD-10-CM | POA: Diagnosis not present

## 2022-02-02 DIAGNOSIS — G8929 Other chronic pain: Secondary | ICD-10-CM | POA: Diagnosis not present

## 2022-02-02 DIAGNOSIS — L905 Scar conditions and fibrosis of skin: Secondary | ICD-10-CM

## 2022-02-02 DIAGNOSIS — E1169 Type 2 diabetes mellitus with other specified complication: Secondary | ICD-10-CM | POA: Diagnosis not present

## 2022-02-02 DIAGNOSIS — M5442 Lumbago with sciatica, left side: Secondary | ICD-10-CM | POA: Diagnosis not present

## 2022-02-02 DIAGNOSIS — M5441 Lumbago with sciatica, right side: Secondary | ICD-10-CM | POA: Diagnosis not present

## 2022-02-02 DIAGNOSIS — Z23 Encounter for immunization: Secondary | ICD-10-CM | POA: Diagnosis not present

## 2022-02-02 DIAGNOSIS — E669 Obesity, unspecified: Secondary | ICD-10-CM

## 2022-02-02 MED ORDER — ATORVASTATIN CALCIUM 20 MG PO TABS: 20.0000 mg | ORAL_TABLET | Freq: Every day | ORAL | 4 refills | Status: DC

## 2022-02-02 MED ORDER — SHINGRIX 50 MCG/0.5ML IM SUSR: 0.5000 mL | Freq: Once | INTRAMUSCULAR | 0 refills | Status: AC

## 2022-02-02 NOTE — Assessment & Plan Note (Signed)
Diagnosed in August 2022, recent A1c stable at 6.9% and urine ALB 30 (June 2023).  Continue Metformin 500 MG BID and consider addition of ACE/ARB in future -- although would monitor closely with his current stable BP -- will further discuss with him next visit.  Recommend he check BS at home a few mornings a week with goal <130.  CMP, CBC, TSH today. Going to [place] until [place], recommend he have A1c check while there and return to office April 2024.

## 2022-02-02 NOTE — Assessment & Plan Note (Signed)
Chronic at baseline. Continue Gabapentin 300 MG QHS and may increase to 600 MG if needed, educated him on this medication and use for pain. Use NSAID only as needed.   Recommend alternating heat/ice + placing Voltaren gel on back at home.  Continue to rest and avoid lifting until improved.  Attempted to get PT via workmen's, Worker's Comp # [phone] = [facility] and was unable to.

## 2022-02-02 NOTE — Assessment & Plan Note (Signed)
To right lower abdomen, has discomfort here with squatting only at times -- ongoing for two years.  No hernia palpated on exam, monitor closely and obtain imaging as needed.  Recommend wearing abdominal support when squats performed at home + may massage some Voltaren gel to area prior to working out. If worsening alert provider.

## 2022-02-02 NOTE — Progress Notes (Signed)
BP 120/65   Pulse 72   Temp 98.5 F (36.9 C) (Oral)   Ht 5' 6.3" (1.684 m)   Wt 207 lb 12.8 oz (94.3 kg)   SpO2 95%   BMI 33.24 kg/m    Subjective:    Patient ID: Dale Morton, male    DOB: 1950/08/11, 71 y.o.   MRN: [id number]  HPI: Dale Morton is a 71 y.o. male presenting on 02/02/2022 for comprehensive medical examination. Current medical complaints include:none  He currently lives with: wife Interim Problems from his last visit: no  DIABETES Recent A1c 6.9% in June.  Continues Metformin 500 MG BID and Atorvastatin 20 MG for HLD -- is out of this. Hypoglycemic episodes:no Polydipsia/polyuria: no Visual disturbance: no Chest pain: no Paresthesias: no Glucose Monitoring: no             Accucheck frequency: Not Checking             Fasting glucose:             Post prandial:             Evening:             Before meals: Taking Insulin?: no             Long acting insulin:             Short acting insulin: Blood Pressure Monitoring: not checking Retinal Examination: Up To Date Foot Exam: Up to Date Pneumovax: Up to Date Influenza: Up to Date Aspirin: no  The 10-year ASCVD risk score (Arnett DK, et al., 2019) is: 29.2%   Values used to calculate the score:     Age: 63 years     Sex: Male     Is Non-Hispanic African American: No     Diabetic: Yes     Tobacco smoker: No     Systolic Blood Pressure: 759 mmHg     Is BP treated: No     HDL Cholesterol: 37 mg/dL     Total Cholesterol: 176 mg/dL  BACK PAIN CHRONIC Currently more acute pain present in shoulders/neck/back (flare).  Chronic at baseline, since work accident in 84 or 1989.  During accident he was pulling a deck and it got broken, then pulled back during movement + has h/o falling from height during work.    When he is doing tasks he wears back support.  He did not attend physical therapy recently -- no one ever answered about coverage.  Has noticed when bending down or squatting with exercise he  feels like something is moving to right lower abdomen.  He is unsure he if he pushed too hard with exercise.  After showers does 40 squats.  Has felt this for 2 years but only a little bit. Duration: months Mechanism of injury: work accident (chronic)  Location: low back Onset: chronic Severity: 4-5/10 at worst and at best 2/10  Quality: dull and burning across lower back Frequency: intermittent Radiation: none Aggravating factors: lifting and prolonged sitting Alleviating factors:  muscle relaxer Status: fluctuating Treatments attempted: Flexeril  Relief with NSAIDs?: moderate Nighttime pain:  no Paresthesias / decreased sensation:  no Bowel / bladder incontinence:  no Fevers:  no Dysuria / urinary frequency:  no  Functional Status Survey: Is the patient deaf or have difficulty hearing?: No Does the patient have difficulty seeing, even when wearing glasses/contacts?: No Does the patient have difficulty concentrating, remembering, or making decisions?: No Does the patient have  difficulty walking or climbing stairs?: No Does the patient have difficulty dressing or bathing?: No Does the patient have difficulty doing errands alone such as visiting a doctor's office or shopping?: No  FALL RISK:    02/02/2022    1:56 PM 01/31/2021    8:19 AM 02/19/2020   10:41 AM 09/05/2018    2:06 PM 11/13/2016    9:01 AM  [place] in the past year? 0 0 0 0 No  Number falls in past yr: 0 0 0 0   Injury with Fall? 0 0 0 0   Risk for fall due to : No Fall Risks No Fall Risks No Fall Risks    Follow up Falls evaluation completed Education provided Falls evaluation completed;Falls prevention discussed Falls evaluation completed     Depression Screen    02/02/2022    1:57 PM 01/31/2021    8:20 AM 02/19/2020   10:42 AM 09/05/2018    2:08 PM 11/13/2016    9:01 AM  Depression screen PHQ 2/9  Decreased Interest 0 0 0 0 0  Down, Depressed, Hopeless 0 0 0 0 1  PHQ - 2 Score 0 0 0 0 1  Altered  sleeping 0 0     Tired, decreased energy 0 0     Change in appetite 0 0     Feeling bad or failure about yourself  0 0     Trouble concentrating 0 0     Moving slowly or fidgety/restless 0 0     Suicidal thoughts 0 0     PHQ-9 Score 0 0     Difficult doing work/chores Not difficult at all Not difficult at all       Advanced Directives <no information>  Past Medical History:  Past Medical History:  Diagnosis Date   Encounter for long-term (current) use of other high-risk medications    GERD (gastroesophageal reflux disease)    Hyperlipidemia    Lumbago     Surgical History:  Past Surgical History:  Procedure Laterality Date   APPENDECTOMY      Medications:  Current Outpatient Medications on File Prior to Visit  Medication Sig   Blood Glucose Monitoring Suppl (ONETOUCH VERIO) w/Device KIT Use to check blood sugar 3 times a day and document results, bring to appointments.  Goal is <130 fasting blood sugar and <180 two hours after meals.   gabapentin (NEURONTIN) 600 MG tablet Take 0.5 tablets (300 mg total) by mouth at bedtime.   glucose blood test strip Use to check blood sugar 3 times daily, fasting in morning with goal <130 and 2 hours after meals with goal <180.  Bring blood sugar log to visits.   meloxicam (MOBIC) 15 MG tablet Take 15 mg by mouth daily.   metFORMIN (GLUCOPHAGE) 500 MG tablet Take 1 tablet (500 mg total) by mouth 2 (two) times daily with a meal.   omeprazole (PRILOSEC) 40 MG capsule Take 1 capsule (40 mg total) by mouth daily.   OneTouch Delica Lancets 88B MISC To check blood sugar prior to eating in morning with goal of less then 130.  Document for visits.   tiZANidine (ZANAFLEX) 4 MG tablet Take 1 tablet (4 mg total) by mouth every 6 (six) hours as needed for muscle spasms.   No current facility-administered medications on file prior to visit.    Allergies:  No Known Allergies  Social History:  Social History   Socioeconomic History   Marital  status: Married  Spouse name: Not on file   Number of children: Not on file   Years of education: Not on file   Highest education level: Not on file  Occupational History   Not on file  Tobacco Use   Smoking status: Former    Types: Cigarettes    Quit date: 05/30/1985    Years since quitting: 36.7   Smokeless tobacco: Never  Vaping Use   Vaping Use: Never used  Substance and Sexual Activity   Alcohol use: No    Alcohol/week: 0.0 standard drinks of alcohol   Drug use: No   Sexual activity: Yes  Other Topics Concern   Not on file  Social History Narrative   Not on file   Social Determinants of Health   Financial Resource Strain: Low Risk  (01/31/2021)   Overall Financial Resource Strain (CARDIA)    Difficulty of Paying Living Expenses: Not hard at all  Food Insecurity: No Food Insecurity (01/31/2021)   Hunger Vital Sign    Worried About Running Out of Food in the Last Year: Never true    [place] in the Last Year: Never true  Transportation Needs: No Transportation Needs (01/31/2021)   PRAPARE - Hydrologist (Medical): No    Lack of Transportation (Non-Medical): No  Physical Activity: Sufficiently Active (01/31/2021)   Exercise Vital Sign    Days of Exercise per Week: 4 days    Minutes of Exercise per Session: 40 min  Stress: No Stress Concern Present (01/31/2021)   [place]    Feeling of Stress : Not at all  Social Connections: Moderately Isolated (01/31/2021)   Social Connection and Isolation Panel [NHANES]    Frequency of Communication with Friends and Family: More than three times a week    Frequency of Social Gatherings with Friends and Family: More than three times a week    Attends Religious Services: Never    Marine scientist or Organizations: No    Attends Archivist Meetings: Never    Marital Status: Married  Human resources officer Violence: Not At  Risk (01/31/2021)   Humiliation, Afraid, Rape, and Kick questionnaire    Fear of Current or Ex-Partner: No    Emotionally Abused: No    Physically Abused: No    Sexually Abused: No   Social History   Tobacco Use  Smoking Status Former   Types: Cigarettes   Quit date: 05/30/1985   Years since quitting: 36.7  Smokeless Tobacco Never   Social History   Substance and Sexual Activity  Alcohol Use No   Alcohol/week: 0.0 standard drinks of alcohol    Family History:  Family History  Problem Relation Age of Onset   Hypertension Father    Cancer Sister        breast    Past medical history, surgical history, medications, allergies, family history and social history reviewed with patient today and changes made to appropriate areas of the chart.   ROS All other ROS negative except what is listed above and in the HPI.      Objective:    BP 120/65   Pulse 72   Temp 98.5 F (36.9 C) (Oral)   Ht 5' 6.3" (1.684 m)   Wt 207 lb 12.8 oz (94.3 kg)   SpO2 95%   BMI 33.24 kg/m   Wt Readings from Last 3 Encounters:  02/02/22 207 lb 12.8 oz (94.3 kg)  11/21/21 201 lb 12.8 oz (91.5 kg)  10/20/21 200 lb 6.4 oz (90.9 kg)    No results found.  Physical Exam Vitals and nursing note reviewed.  Constitutional:      General: He is awake. He is not in acute distress.    Appearance: He is well-developed and well-groomed. He is obese. He is not ill-appearing or toxic-appearing.  HENT:     Head: Normocephalic and atraumatic.     Right Ear: Hearing, tympanic membrane, ear canal and external ear normal. No drainage.     Left Ear: Hearing, tympanic membrane, ear canal and external ear normal. No drainage.     Nose: Nose normal.     Mouth/Throat:     Pharynx: Uvula midline.  Eyes:     General: Lids are normal.        Right eye: No discharge.        Left eye: No discharge.     Extraocular Movements: Extraocular movements intact.     Conjunctiva/sclera: Conjunctivae normal.     Pupils:  Pupils are equal, round, and reactive to light.     Visual Fields: Right eye visual fields normal and left eye visual fields normal.  Neck:     Thyroid: No thyromegaly.     Vascular: No carotid bruit or JVD.     Trachea: Trachea normal.  Cardiovascular:     Rate and Rhythm: Normal rate and regular rhythm.     Heart sounds: Normal heart sounds, S1 normal and S2 normal. No murmur heard.    No gallop.  Pulmonary:     Effort: Pulmonary effort is normal. No accessory muscle usage or respiratory distress.     Breath sounds: Normal breath sounds.  Abdominal:     General: Bowel sounds are normal.     Palpations: Abdomen is soft. There is no hepatomegaly or splenomegaly.     Tenderness: There is no abdominal tenderness.     Hernia: No hernia is present.    Musculoskeletal:        General: Normal range of motion.     Cervical back: Normal range of motion and neck supple.     Right lower leg: No edema.     Left lower leg: No edema.  Lymphadenopathy:     Head:     Right side of head: No submental, submandibular, tonsillar, preauricular or posterior auricular adenopathy.     Left side of head: No submental, submandibular, tonsillar, preauricular or posterior auricular adenopathy.     Cervical: No cervical adenopathy.  Skin:    General: Skin is warm and dry.     Capillary Refill: Capillary refill takes less than 2 seconds.     Findings: No rash.  Neurological:     Mental Status: He is alert and oriented to person, place, and time.     Gait: Gait is intact.     Deep Tendon Reflexes: Reflexes are normal and symmetric.     Reflex Scores:      Brachioradialis reflexes are 2+ on the right side and 2+ on the left side.      Patellar reflexes are 2+ on the right side and 2+ on the left side. Psychiatric:        Attention and Perception: Attention normal.        Mood and Affect: Mood normal.        Speech: Speech normal.        Behavior: Behavior normal. Behavior is cooperative.  Thought Content: Thought content normal.        Cognition and Memory: Cognition normal.    Results for orders placed or performed in visit on 01/05/22  HM DIABETES EYE EXAM  Result Value Ref Range   HM Diabetic Eye Exam No Retinopathy No Retinopathy      Assessment & Plan:   Problem List Items Addressed This Visit       Digestive   Gastroesophageal reflux disease without esophagitis    Chronic, ongoing.  At this time continue Omeprazole and recommend he not take Meloxicam on regular basis, take only as needed.  Monitor diet and avoid trigger foods.  Discussed the risks and benefits of long term PPI use including but not limited to bone loss, chronic kidney disease, infections, low magnesium.  Will aim to use at the lowest dose for the shortest period of time.  Mag level today.      Relevant Orders   Magnesium     Endocrine   Hyperlipidemia associated with type 2 diabetes mellitus (HCC)    Chronic, ongoing.  Continue current medication regimen and adjust as needed. Lipid panel today.       Relevant Medications   atorvastatin (LIPITOR) 20 MG tablet   Other Relevant Orders   Comprehensive metabolic panel   Lipid Panel w/o Chol/HDL Ratio   Type 2 diabetes mellitus with obesity ([place]) - Primary    Diagnosed in August 2022, recent A1c stable at 6.9% and urine ALB 30 (June 2023).  Continue Metformin 500 MG BID and consider addition of ACE/ARB in future -- although would monitor closely with his current stable BP -- will further discuss with him next visit.  Recommend he check BS at home a few mornings a week with goal <130.  CMP, CBC, TSH today. Going to [place] until April, recommend he have A1c check while there and return to office April 2024.      Relevant Medications   atorvastatin (LIPITOR) 20 MG tablet   Other Relevant Orders   CBC with Differential/Platelet   TSH     Genitourinary   Benign prostatic hyperplasia without lower urinary tract symptoms    No current  symptoms, check PSA on labs today.      Relevant Orders   PSA     Other   Elevated liver enzymes    History of elevations, recheck CMP today.  Consider ultrasound if ongoing elevations or worsening.      Relevant Orders   Comprehensive metabolic panel   Low back pain    Chronic at baseline. Continue Gabapentin 300 MG QHS and may increase to 600 MG if needed, educated him on this medication and use for pain. Use NSAID only as needed.   Recommend alternating heat/ice + placing Voltaren gel on back at home.  Continue to rest and avoid lifting until improved.  Attempted to get PT via workmen's, Worker's Comp # [phone] = [facility] and was unable to.        Relevant Medications   meloxicam (MOBIC) 15 MG tablet   Other Relevant Orders   CBC with Differential/Platelet   Obesity    BMI 33.24.  Recommended eating smaller high protein, low fat meals more frequently and exercising 30 mins a day 5 times a week with a goal of 10-15lb weight loss in the next 3 months. Patient voiced their understanding and motivation to adhere to these recommendations.       Scar tissue    To right lower  abdomen, has discomfort here with squatting only at times -- ongoing for two years.  No hernia palpated on exam, monitor closely and obtain imaging as needed.  Recommend wearing abdominal support when squats performed at home + may massage some Voltaren gel to area prior to working out. If worsening alert provider.      Other Visit Diagnoses     Need for shingles vaccine       Shingrix x 2 ordered today and educated patient on this.   Relevant Medications   Zoster Vaccine Adjuvanted [facility]) injection   Zoster Vaccine Adjuvanted [facility]) injection (Start on 03/18/2022)   Encounter for annual physical exam       Annual exam today with health maintenance review with patient.       Discussed aspirin prophylaxis for myocardial infarction prevention and decision was it was not  indicated  LABORATORY TESTING:  Health maintenance labs ordered today as discussed above.   The natural history of prostate cancer and ongoing controversy regarding screening and potential treatment outcomes of prostate cancer has been discussed with the patient. The meaning of a false positive PSA and a false negative PSA has been discussed. He indicates understanding of the limitations of this screening test and wishes to proceed with screening PSA testing.  IMMUNIZATIONS:   - Tdap: Tetanus vaccination status reviewed: refuses today. - Influenza: Up to date - Pneumovax: Not applicable - Prevnar: Not applicable - Zostavax vaccine: Ordered today  SCREENING: - Colonoscopy: Up to date  Discussed with patient purpose of the colonoscopy is to detect colon cancer at curable precancerous or early stages   - AAA Screening: Not applicable  -Hearing Test: Not applicable  -Spirometry: Not applicable   PATIENT COUNSELING:    Sexuality: Discussed sexually transmitted diseases, partner selection, use of condoms, avoidance of unintended pregnancy  and contraceptive alternatives.   Advised to avoid cigarette smoking.  I discussed with the patient that most people either abstain from alcohol or drink within safe limits (<=14/week and <=4 drinks/occasion for males, <=7/weeks and <= 3 drinks/occasion for females) and that the risk for alcohol disorders and other health effects rises proportionally with the number of drinks per week and how often a drinker exceeds daily limits.  Discussed cessation/primary prevention of drug use and availability of treatment for abuse.   Diet: Encouraged to adjust caloric intake to maintain  or achieve ideal body weight, to reduce intake of dietary saturated fat and total fat, to limit sodium intake by avoiding high sodium foods and not adding table salt, and to maintain adequate dietary potassium and calcium preferably from fresh fruits, vegetables, and low-fat dairy  products.    Stressed the importance of regular exercise  Injury prevention: Discussed safety belts, safety helmets, smoke detector, smoking near bedding or upholstery.   Dental health: Discussed importance of regular tooth brushing, flossing, and dental visits.   Follow up plan: NEXT PREVENTATIVE PHYSICAL DUE IN 1 YEAR. Return in about 8 months (around 10/06/2022) for T2DM, HTN/HLD, BACK PAIN.

## 2022-02-02 NOTE — Assessment & Plan Note (Signed)
No current symptoms, check PSA on labs today. 

## 2022-02-02 NOTE — Assessment & Plan Note (Signed)
History of elevations, recheck CMP today.  Consider ultrasound if ongoing elevations or worsening.

## 2022-02-02 NOTE — Assessment & Plan Note (Signed)
BMI 33.24.  Recommended eating smaller high protein, low fat meals more frequently and exercising 30 mins a day 5 times a week with a goal of 10-15lb weight loss in the next 3 months. Patient voiced their understanding and motivation to adhere to these recommendations.

## 2022-02-02 NOTE — Assessment & Plan Note (Signed)
Chronic, ongoing.  Continue current medication regimen and adjust as needed. Lipid panel today. 

## 2022-02-02 NOTE — Assessment & Plan Note (Signed)
Chronic, ongoing.  At this time continue Omeprazole and recommend he not take Meloxicam on regular basis, take only as needed.  Monitor diet and avoid trigger foods.  Discussed the risks and benefits of long term PPI use including but not limited to bone loss, chronic kidney disease, infections, low magnesium.  Will aim to use at the lowest dose for the shortest period of time.  Mag level today.

## 2022-02-03 ENCOUNTER — Other Ambulatory Visit: Payer: Self-pay | Admitting: Nurse Practitioner

## 2022-02-03 DIAGNOSIS — R972 Elevated prostate specific antigen [PSA]: Secondary | ICD-10-CM

## 2022-02-03 DIAGNOSIS — E1169 Type 2 diabetes mellitus with other specified complication: Secondary | ICD-10-CM

## 2022-02-03 LAB — COMPREHENSIVE METABOLIC PANEL
ALT: 24 IU/L (ref 0–44)
AST: 23 IU/L (ref 0–40)
Albumin/Globulin Ratio: 1.6 (ref 1.2–2.2)
Albumin: 4.6 g/dL (ref 3.9–4.9)
Alkaline Phosphatase: 125 IU/L — ABNORMAL HIGH (ref 44–121)
BUN/Creatinine Ratio: 18 (ref 10–24)
BUN: 15 mg/dL (ref 8–27)
Bilirubin Total: 0.3 mg/dL (ref 0.0–1.2)
CO2: 18 mmol/L — ABNORMAL LOW (ref 20–29)
Calcium: 9 mg/dL (ref 8.6–10.2)
Chloride: 103 mmol/L (ref 96–106)
Creatinine, Ser: 0.85 mg/dL (ref 0.76–1.27)
Globulin, Total: 2.8 g/dL (ref 1.5–4.5)
Glucose: 110 mg/dL — ABNORMAL HIGH (ref 70–99)
Potassium: 4 mmol/L (ref 3.5–5.2)
Sodium: 139 mmol/L (ref 134–144)
Total Protein: 7.4 g/dL (ref 6.0–8.5)
eGFR: 94 mL/min/{1.73_m2} (ref >59)

## 2022-02-03 LAB — CBC WITH DIFFERENTIAL/PLATELET
Basophils Absolute: 0.1 10*3/uL (ref 0.0–0.2)
Basos: 1 %
EOS (ABSOLUTE): 1.6 10*3/uL — ABNORMAL HIGH (ref 0.0–0.4)
Eos: 22 %
Hematocrit: 41.7 % (ref 37.5–51.0)
Hemoglobin: 14.5 g/dL (ref 13.0–17.7)
Immature Grans (Abs): 0 10*3/uL (ref 0.0–0.1)
Immature Granulocytes: 0 %
Lymphocytes Absolute: 2.4 10*3/uL (ref 0.7–3.1)
Lymphs: 33 %
MCH: 30.7 pg (ref 26.6–33.0)
MCHC: 34.8 g/dL (ref 31.5–35.7)
MCV: 88 fL (ref 79–97)
Monocytes Absolute: 0.5 10*3/uL (ref 0.1–0.9)
Monocytes: 7 %
Neutrophils Absolute: 2.7 10*3/uL (ref 1.4–7.0)
Neutrophils: 37 %
Platelets: 216 10*3/uL (ref 150–450)
RBC: 4.72 x10E6/uL (ref 4.14–5.80)
RDW: 12.3 % (ref 11.6–15.4)
WBC: 7.2 10*3/uL (ref 3.4–10.8)

## 2022-02-03 LAB — TSH: TSH: 2.89 u[IU]/mL (ref 0.450–4.500)

## 2022-02-03 LAB — LIPID PANEL W/O CHOL/HDL RATIO
Cholesterol, Total: 246 mg/dL — ABNORMAL HIGH (ref 100–199)
HDL: 26 mg/dL — ABNORMAL LOW (ref >39)
LDL Chol Calc (NIH): 98 mg/dL (ref 0–99)
Triglycerides: 715 mg/dL (ref 0–149)
VLDL Cholesterol Cal: 122 mg/dL — ABNORMAL HIGH (ref 5–40)

## 2022-02-03 LAB — PSA: Prostate Specific Ag, Serum: 4.9 ng/mL — ABNORMAL HIGH (ref 0.0–4.0)

## 2022-02-03 LAB — MAGNESIUM: Magnesium: 2 mg/dL (ref 1.6–2.3)

## 2022-02-03 NOTE — Progress Notes (Signed)
Good morning, please contact patient and alert him to results + schedule labs only visit, he does not check MyChart: - Cholesterol labs show major elevation in triglycerides in 700 range -- please ensure you are taking Atorvastatin daily and I want you to return in 4 weeks for fasting labs only visit to recheck.-- may be sooner lab visit if leaving on trip prior to then. - Kidney and liver function remain stable. - PSA, prostate blood work, is elevated -- this is new for you.  I would like to recheck this in 4 weeks too on outpatient labs -- this does not mean you have prostate cancer, but could mean some mild enlargement in prostate with aging.  Prior to next labs please have no intercourse 48 hours prior to lab visit -- this can elevated lab. - Remainder of labs are stable, continue all medications.  Any questions? Keep being stellar!!  Thank you for allowing me to participate in your care.  I appreciate you. Kindest regards, Yomara Toothman

## 2022-02-17 ENCOUNTER — Ambulatory Visit (INDEPENDENT_AMBULATORY_CARE_PROVIDER_SITE_OTHER): Payer: Medicare Other | Admitting: *Deleted

## 2022-02-17 DIAGNOSIS — Z0000 Encounter for general adult medical examination without abnormal findings: Secondary | ICD-10-CM

## 2022-02-17 NOTE — Progress Notes (Signed)
Subjective:   Dale Morton is a 71 y.o. male who presents for an Initial Medicare Annual Wellness Visit.  I connected with  Dale Morton  through interpretor service on 02/17/22 by a telephone enabled telemedicine application and verified that I am speaking with the correct person using two identifiers.   I discussed the limitations of evaluation and management by telemedicine. The patient expressed understanding and agreed to proceed.  Patient location: home  Provider location: Tele-health-home    Review of Systems     Cardiac Risk Factors include: advanced age (>68mn, >>88women);diabetes mellitus;male gender;obesity (BMI >30kg/m2)     Objective:    Today's Vitals   There is no height or weight on file to calculate BMI.      No data to display          Current Medications (verified) Outpatient Encounter Medications as of 02/17/2022  Medication Sig   atorvastatin (LIPITOR) 20 MG tablet Take 1 tablet (20 mg total) by mouth daily.   Blood Glucose Monitoring Suppl (ONETOUCH VERIO) w/Device KIT Use to check blood sugar 3 times a day and document results, bring to appointments.  Goal is <130 fasting blood sugar and <180 two hours after meals.   gabapentin (NEURONTIN) 600 MG tablet Take 0.5 tablets (300 mg total) by mouth at bedtime.   glucose blood test strip Use to check blood sugar 3 times daily, fasting in morning with goal <130 and 2 hours after meals with goal <180.  Bring blood sugar log to visits.   meloxicam (MOBIC) 15 MG tablet Take 15 mg by mouth daily.   metFORMIN (GLUCOPHAGE) 500 MG tablet Take 1 tablet (500 mg total) by mouth 2 (two) times daily with a meal.   omeprazole (PRILOSEC) 40 MG capsule Take 1 capsule (40 mg total) by mouth daily.   OneTouch Delica Lancets [id number] To check blood sugar prior to eating in morning with goal of less then 130.  Document for visits.   tiZANidine (ZANAFLEX) 4 MG tablet Take 1 tablet (4 mg total) by mouth every 6 (six) hours  as needed for muscle spasms.   [START ON 03/18/2022] Zoster Vaccine Adjuvanted ([facility] injection Inject 0.5 mLs into the muscle once for 1 dose. Dose # 2 (Patient not taking: Reported on 02/17/2022)   No facility-administered encounter medications on file as of 02/17/2022.    Allergies (verified) Patient has no known allergies.   History: Past Medical History:  Diagnosis Date   Encounter for long-term (current) use of other high-risk medications    GERD (gastroesophageal reflux disease)    Hyperlipidemia    Lumbago    Past Surgical History:  Procedure Laterality Date   APPENDECTOMY     Family History  Problem Relation Age of Onset   Hypertension Father    Cancer Sister        breast   Social History   Socioeconomic History   Marital status: Married    Spouse name: Not on file   Number of children: Not on file   Years of education: Not on file   Highest education level: Not on file  Occupational History   Not on file  Tobacco Use   Smoking status: Former    Types: Cigarettes    Quit date: 05/30/1985    Years since quitting: 36.7   Smokeless tobacco: Never  Vaping Use   Vaping Use: Never used  Substance and Sexual Activity   Alcohol use: No    Alcohol/week:  0.0 standard drinks of alcohol   Drug use: No   Sexual activity: Yes  Other Topics Concern   Not on file  Social History Narrative   Not on file   Social Determinants of Health   Financial Resource Strain: Low Risk  (02/17/2022)   Overall Financial Resource Strain (CARDIA)    Difficulty of Paying Living Expenses: Not hard at all  Food Insecurity: No Food Insecurity (02/17/2022)   Hunger Vital Sign    Worried About Running Out of Food in the Last Year: Never true    Ran Out of Food in the Last Year: Never true  Transportation Needs: No Transportation Needs (02/17/2022)   PRAPARE - Hydrologist (Medical): No    Lack of Transportation (Non-Medical): No  Physical Activity:  Insufficiently Active (02/17/2022)   Exercise Vital Sign    Days of Exercise per Week: 3 days    Minutes of Exercise per Session: 30 min  Stress: No Stress Concern Present (02/17/2022)   [place]    Feeling of Stress : Not at all  Social Connections: Moderately Integrated (02/17/2022)   Social Connection and Isolation Panel [NHANES]    Frequency of Communication with Friends and Family: Three times a week    Frequency of Social Gatherings with Friends and Family: Once a week    Attends Religious Services: More than 4 times per year    Active Member of [employer] or Organizations: No    Attends Music therapist: Never    Marital Status: Married    Tobacco Counseling Counseling given: Not Answered   Clinical Intake:  Pre-visit preparation completed: Yes  Pain : No/denies pain     Nutritional Risks: None Diabetes: Yes CBG done?: No Did pt. bring in CBG monitor from home?: No  How often do you need to have someone help you when you read instructions, pamphlets, or other written materials from your doctor or pharmacy?: 3 - Sometimes (when isn't in [place])  Diabetic?  Yes  Nutrition Risk Assessment:  Has the patient had any N/V/D within the last 2 months?  No  Does the patient have any non-healing wounds?  No  Has the patient had any unintentional weight loss or weight gain?  No   Diabetes:  Is the patient diabetic?  Yes  If diabetic, was a CBG obtained today?  No  Did the patient bring in their glucometer from home?  No  How often do you monitor your CBG's? Does not check.   Financial Strains and Diabetes Management:  Are you having any financial strains with the device, your supplies or your medication? No .  Does the patient want to be seen by Chronic Care Management for management of their diabetes?  No  Would the patient like to be referred to a Nutritionist or for Diabetic Management?  No    Diabetic Exams:  Diabetic Eye Exam:  Pt has been advised about the importance in completing this exam. A referral has been placed today.  Diabetic Foot Exam:  Pt has been advised about the importance in completing this exam.   Interpreter Needed?: No  Information entered by :: Leroy Kennedy LPN   Activities of Daily Living    02/17/2022   12:09 PM 02/02/2022    2:15 PM  In your present state of health, do you have any difficulty performing the following activities:  Hearing? 0 0  Vision? 0 0  Difficulty concentrating or making decisions? 0 0  Walking or climbing stairs? 0 0  Dressing or bathing? 0 0  Doing errands, shopping? 0 0  Preparing Food and eating ? N   Using the Toilet? N   In the past six months, have you accidently leaked urine? N   Do you have problems with loss of bowel control? N   Managing your Medications? N   Managing your Finances? N   Housekeeping or managing your Housekeeping? N     Patient Care Team: [name], NP as PCP - General (Nurse Practitioner)  Indicate any recent Medical Services you may have received from other than [facility] providers in the past year (date may be approximate).     Assessment:   This is a routine wellness examination for [place].  Hearing/Vision screen Hearing Screening - Comments:: No trouble hearing Vision Screening - Comments:: Not up to date [facility]-   Dietary issues and exercise activities discussed: Current Exercise Habits: The patient does not participate in regular exercise at present   Goals Addressed   None    Depression Screen    02/17/2022   11:29 AM 02/02/2022    1:57 PM 01/31/2021    8:20 AM 02/19/2020   10:42 AM 09/05/2018    2:08 PM 11/13/2016    9:01 AM  PHQ 2/9 Scores  PHQ - 2 Score 0 0 0 0 0 1  PHQ- 9 Score 0 0 0       Fall Risk    02/17/2022   11:23 AM 02/02/2022    1:56 PM 01/31/2021    8:19 AM 02/19/2020   10:41 AM 09/05/2018    2:06 PM  [place] in the past year? 0 0 0 0 0   Number falls in past yr: 0 0 0 0 0  Injury with Fall? 0 0 0 0 0  Risk for fall due to :  No Fall Risks No Fall Risks No Fall Risks   Follow up Falls evaluation completed;Education provided;Falls prevention discussed Falls evaluation completed Education provided Falls evaluation completed;Falls prevention discussed Falls evaluation completed    FALL RISK PREVENTION PERTAINING TO THE HOME:  Any stairs in or around the home? Yes  If so, are there any without handrails? No  Home free of loose throw rugs in walkways, pet beds, electrical cords, etc? Yes  Adequate lighting in your home to reduce risk of falls? Yes   ASSISTIVE DEVICES UTILIZED TO PREVENT FALLS:  Life alert? No  Use of a cane, walker or w/c? No  Grab bars in the bathroom? No  Shower chair or bench in shower? No  Elevated toilet seat or a handicapped toilet? No   TIMED UP AND GO:  Was the test performed? No .    Cognitive Function:        02/17/2022   11:25 AM  6CIT Screen  What Year? 0 points  What month? 0 points  What time? 0 points  Count back from 20 0 points  Months in reverse 4 points  Repeat phrase 4 points  Total Score 8 points    Immunizations Immunization History  Administered Date(s) Administered   Fluad Quad(high Dose 65+) 03/29/2019, 02/19/2020, 04/25/2021   Influenza, High Dose Seasonal PF 03/12/2017   Moderna Sars-Covid-2 Vaccination 07/31/2019, 08/29/2019   Pneumococcal Conjugate-13 03/12/2017   Pneumococcal Polysaccharide-23 03/29/2019   Tdap 10/23/2011    TDAP status: Due, Education has been provided regarding the importance of this vaccine.  Advised may receive this vaccine at local pharmacy or Health Dept. Aware to provide a copy of the vaccination record if obtained from local pharmacy or Health Dept. Verbalized acceptance and understanding.  Flu Vaccine status: Up to date  Pneumococcal vaccine status: Up to date  Covid-19 vaccine status: Information provided on how to obtain  vaccines.   Qualifies for Shingles Vaccine? Yes   Zostavax completed No   Shingrix Completed?: No.    Education has been provided regarding the importance of this vaccine. Patient has been advised to call insurance company to determine out of pocket expense if they have not yet received this vaccine. Advised may also receive vaccine at local pharmacy or Health Dept. Verbalized acceptance and understanding.  Screening Tests Health Maintenance  Topic Date Due   INFLUENZA VACCINE  01/06/2022   COVID-19 Vaccine (3 - Moderna series) 02/18/2022 (Originally 10/24/2019)   Zoster Vaccines- Shingrix (1 of 2) 05/05/2022 (Originally 02/28/2002)   TETANUS/TDAP  11/22/2022 (Originally 10/22/2021)   FOOT EXAM  05/09/2022   HEMOGLOBIN A1C  05/23/2022   URINE MICROALBUMIN  11/22/2022   OPHTHALMOLOGY EXAM  12/23/2022   COLONOSCOPY (Pts 45-42yr Insurance coverage will need to be confirmed)  06/09/2027   Pneumonia Vaccine 70 Years old  Completed   Hepatitis C Screening  Completed   HPV VACCINES  Aged Out    Health Maintenance  Health Maintenance Due  Topic Date Due   INFLUENZA VACCINE  01/06/2022    Colorectal cancer screening: Type of screening: Colonoscopy. Completed 2019. Repeat every 10 years  Lung Cancer Screening: (Low Dose CT Chest recommended if Age 70-80years, 30 pack-year currently smoking OR have quit w/in 15years.) does not qualify.   Lung Cancer Screening Referral:   Additional Screening:  Hepatitis C Screening: does not qualify; Completed 2018  Vision Screening: Recommended annual ophthalmology exams for early detection of glaucoma and other disorders of the eye. Is the patient up to date with their annual eye exam?  Yes  Who is the provider or what is the name of the office in which the patient attends annual eye exams? [facility] [place] If pt is not established with a provider, would they like to be referred to a provider to establish care? No .   Dental Screening: Recommended  annual dental exams for proper oral hygiene  Community Resource Referral / Chronic Care Management: CRR required this visit?  No   CCM required this visit?  No      Plan:     I have personally reviewed and noted the following in the patient's chart:   Medical and social history Use of alcohol, tobacco or illicit drugs  Current medications and supplements including opioid prescriptions. Patient is not currently taking opioid prescriptions. Functional ability and status Nutritional status Physical activity Advanced directives List of other physicians Hospitalizations, surgeries, and ER visits in previous 12 months Vitals Screenings to include cognitive, depression, and falls Referrals and appointments  In addition, I have reviewed and discussed with patient certain preventive protocols, quality metrics, and best practice recommendations. A written personalized care plan for preventive services as well as general preventive health recommendations were provided to patient.     JLeroy Kennedy LPN   [id number]  Nurse Notes:   Labs were given to patient per telephone message.  Patient verbalized understanding through interpretor service.   Patient was not taking his Atorvastatin he will refill prescription and call to schedule lab visit  in 4 weeks once he restarts.

## 2022-02-21 NOTE — Patient Instructions (Signed)

## 2022-02-23 ENCOUNTER — Ambulatory Visit: Payer: Medicare Other | Admitting: Nurse Practitioner

## 2022-02-24 ENCOUNTER — Ambulatory Visit (INDEPENDENT_AMBULATORY_CARE_PROVIDER_SITE_OTHER): Payer: Medicare Other | Admitting: Nurse Practitioner

## 2022-02-24 ENCOUNTER — Encounter: Payer: Self-pay | Admitting: Nurse Practitioner

## 2022-02-24 VITALS — BP 121/75 | HR 69 | Temp 97.9°F | Ht 66.3 in | Wt 202.7 lb

## 2022-02-24 DIAGNOSIS — K219 Gastro-esophageal reflux disease without esophagitis: Secondary | ICD-10-CM | POA: Diagnosis not present

## 2022-02-24 DIAGNOSIS — M5441 Lumbago with sciatica, right side: Secondary | ICD-10-CM

## 2022-02-24 DIAGNOSIS — Z6832 Body mass index (BMI) 32.0-32.9, adult: Secondary | ICD-10-CM

## 2022-02-24 DIAGNOSIS — N400 Enlarged prostate without lower urinary tract symptoms: Secondary | ICD-10-CM | POA: Diagnosis not present

## 2022-02-24 DIAGNOSIS — E1169 Type 2 diabetes mellitus with other specified complication: Secondary | ICD-10-CM

## 2022-02-24 DIAGNOSIS — M5442 Lumbago with sciatica, left side: Secondary | ICD-10-CM

## 2022-02-24 DIAGNOSIS — G8929 Other chronic pain: Secondary | ICD-10-CM

## 2022-02-24 DIAGNOSIS — E785 Hyperlipidemia, unspecified: Secondary | ICD-10-CM

## 2022-02-24 DIAGNOSIS — E669 Obesity, unspecified: Secondary | ICD-10-CM

## 2022-02-24 DIAGNOSIS — E6609 Other obesity due to excess calories: Secondary | ICD-10-CM

## 2022-02-24 LAB — BAYER DCA HB A1C WAIVED: HB A1C (BAYER DCA - WAIVED): 7.6 % — ABNORMAL HIGH (ref 4.8–5.6)

## 2022-02-24 MED ORDER — OMEPRAZOLE 40 MG PO CPDR: 40.0000 mg | DELAYED_RELEASE_CAPSULE | Freq: Every day | ORAL | 4 refills | Status: DC

## 2022-02-24 MED ORDER — METFORMIN HCL 500 MG PO TABS: 500.0000 mg | ORAL_TABLET | Freq: Two times a day (BID) | ORAL | 4 refills | Status: DC

## 2022-02-24 MED ORDER — ATORVASTATIN CALCIUM 20 MG PO TABS: 20.0000 mg | ORAL_TABLET | Freq: Every day | ORAL | 4 refills | Status: DC

## 2022-02-24 NOTE — Assessment & Plan Note (Signed)
Improved at this time, continue Omeprazole as needed and adjust if needed in future.  Recommend heavy focus on diet changes at home.

## 2022-02-24 NOTE — Progress Notes (Signed)
BP 121/75   Pulse 69   Temp 97.9 F (36.6 C) (Oral)   Ht 5' 6.3" (1.684 m)   Wt 202 lb 11.2 oz (91.9 kg)   SpO2 93%   BMI 32.42 kg/m    Subjective:    Patient ID: Dale Morton, male    DOB: 07/26/1950, 71 y.o.   MRN: [id number]  HPI: Dale Morton is a 71 y.o. male  Chief Complaint  Patient presents with   Diabetes   Hyperlipidemia   Hypertension   Back Pain   Gastroesophageal Reflux   Numbness    Patient says he is having numbness is in both of his arms, but mainly his R arm. Patient says when he lays down he has to elevate his arm. Patient says when he laying down, his arm is hanging and that is when it hurts the most. Patient says the pain comes and goes. Patient says he has been taking the Meloxicam and it helps a little. Patient says he also notices numbness in his L hip/leg.   DIABETES Diagnosed in August 2022.  Last A1c on check was 6.9% in June 2023.  Continues Metformin 500 MG BID and Atorvastatin 10 MG daily for HLD.  Leaving for home and not returning until April. Hypoglycemic episodes:no Polydipsia/polyuria: no Visual disturbance: no Chest pain: no Paresthesias: no Glucose Monitoring: no  Accucheck frequency: Not Checking  Fasting glucose:  Post prandial:  Evening:  Before meals: Taking Insulin?: no  Long acting insulin:  Short acting insulin: Blood Pressure Monitoring: not checking Retinal Examination: Not Up To Date -- would like referral Foot Exam: Up to Date Pneumovax: Up to Date Influenza: Up to Date Aspirin: no  The 10-year ASCVD risk score (Arnett DK, et al., 2019) is: 39.7%   Values used to calculate the score:     Age: 61 years     Sex: Male     Is Non-Hispanic African American: No     Diabetic: Yes     Tobacco smoker: No     Systolic Blood Pressure: 123XX123 mmHg     Is BP treated: No     HDL Cholesterol: 26 mg/dL     Total Cholesterol: 246 mg/dL  GERD Continues on Omeprazole. This has offered benefit, has been able to eat chili.   GERD control status: stable Satisfied with current treatment? yes Heartburn frequency: improved Medication side effects: no  Medication compliance: stable Dysphagia: no Odynophagia:  no Hematemesis: no Blood in stool: no EGD: no   BACK & SHOULDER PAIN  Acute on chronic pain present in shoulders/neck/back (flare), having numbness in arms at times with this -- more right arm.  Chronic at baseline, since work accident in 69 or 1989.  During accident he was pulling a deck and it got broken, then pulled back during movement + has h/o falling from height during work.  Did go to physical therapy for period of time, which was beneficial -- however has not been able to get back into this due to workmen's comp not covering.  Last imaging was in 2016.     Has been using Tizanidine and Meloxicam at home + is taking Gabapentin 300 MG at night.  When he is doing tasks he wears back support.   Duration: months Mechanism of injury: work accident (chronic)  Location: low back Onset: chronic Severity: 4-5/10 at worst and at best 2/10  Quality: dull and burning across lower back Frequency: intermittent Radiation: none Aggravating factors: lifting and  prolonged sitting Alleviating factors:  muscle relaxer Status: fluctuating Treatments attempted: Flexeril  Relief with NSAIDs?: moderate Nighttime pain:  no Paresthesias / decreased sensation:  no Bowel / bladder incontinence:  no Fevers:  no Dysuria / urinary frequency:  no  Relevant past medical, surgical, family and social history reviewed and updated as indicated. Interim medical history since our last visit reviewed. Allergies and medications reviewed and updated.  Review of Systems  Constitutional:  Negative for activity change, diaphoresis, fatigue and fever.  Respiratory:  Negative for cough, chest tightness, shortness of breath and wheezing.   Cardiovascular:  Negative for chest pain, palpitations and leg swelling.  Gastrointestinal:  Negative.   Musculoskeletal:  Positive for arthralgias, back pain and neck pain.  Skin: Negative.   Neurological:  Positive for numbness. Negative for dizziness, tremors, facial asymmetry, weakness, light-headedness and headaches.  Psychiatric/Behavioral: Negative.     Per HPI unless specifically indicated above    Objective:    BP 121/75   Pulse 69   Temp 97.9 F (36.6 C) (Oral)   Ht 5' 6.3" (1.684 m)   Wt 202 lb 11.2 oz (91.9 kg)   SpO2 93%   BMI 32.42 kg/m   Wt Readings from Last 3 Encounters:  02/24/22 202 lb 11.2 oz (91.9 kg)  02/02/22 207 lb 12.8 oz (94.3 kg)  11/21/21 201 lb 12.8 oz (91.5 kg)    Physical Exam Vitals and nursing note reviewed.  Constitutional:      General: He is not in acute distress.    Appearance: He is well-developed. He is not ill-appearing.  HENT:     Head: Normocephalic and atraumatic.     Right Ear: Hearing normal. No drainage.     Left Ear: Hearing normal. No drainage.     Mouth/Throat:     Pharynx: Uvula midline.  Eyes:     General: Lids are normal.        Right eye: No discharge.        Left eye: No discharge.     Conjunctiva/sclera: Conjunctivae normal.     Pupils: Pupils are equal, round, and reactive to light.  Neck:     Thyroid: No thyromegaly.     Vascular: No carotid bruit or JVD.     Trachea: Trachea normal.  Cardiovascular:     Rate and Rhythm: Normal rate and regular rhythm.     Heart sounds: Normal heart sounds, S1 normal and S2 normal. No murmur heard.    No gallop.  Pulmonary:     Effort: Pulmonary effort is normal. No accessory muscle usage or respiratory distress.     Breath sounds: Normal breath sounds.  Abdominal:     General: Bowel sounds are normal.     Palpations: Abdomen is soft. There is no hepatomegaly or splenomegaly.  Musculoskeletal:     Cervical back: Normal range of motion and neck supple. No erythema or rigidity. No spinous process tenderness or muscular tenderness. Normal range of motion.      Lumbar back: Tenderness present. No swelling, edema or lacerations. Decreased range of motion.     Right lower leg: No edema.     Left lower leg: No edema.     Comments: Mild tenderness on palpation lower back.  Decreased flexion/extension/lateral ROM.  Negative straight leg.  Skin:    General: Skin is warm and dry.     Capillary Refill: Capillary refill takes less than 2 seconds.  Neurological:     Mental Status: He is alert  and oriented to person, place, and time.     Deep Tendon Reflexes: Reflexes are normal and symmetric.  Psychiatric:        Mood and Affect: Mood normal.        Behavior: Behavior normal.        Thought Content: Thought content normal.        Judgment: Judgment normal.    Results for orders placed or performed in visit on 02/24/22  Bayer DCA Hb A1c Waived  Result Value Ref Range   HB A1C (BAYER DCA - WAIVED) 7.6 (H) 4.8 - 5.6 %      Assessment & Plan:   Problem List Items Addressed This Visit       Digestive   Gastroesophageal reflux disease without esophagitis    Improved at this time, continue Omeprazole as needed and adjust if needed in future.  Recommend heavy focus on diet changes at home.      Relevant Medications   omeprazole (PRILOSEC) 40 MG capsule     Endocrine   Hyperlipidemia associated with type 2 diabetes mellitus (HCC)    Chronic, ongoing.  Continue current medication regimen and adjust as needed. Lipid panel today.       Relevant Medications   atorvastatin (LIPITOR) 20 MG tablet   metFORMIN (GLUCOPHAGE) 500 MG tablet   Other Relevant Orders   Lipid Panel w/o Chol/HDL Ratio   Type 2 diabetes mellitus with obesity ([place]) - Primary    Diagnosed in August 2022, A1c today trending up at 7.6% and urine ALB 30 (June 2023).  Continue Metformin 500 MG BID (does not want to adjust today as leaving country for some months -- will work on diet) and consider addition of ACE/ARB in future -- although would monitor closely with his current stable  BP -- will further discuss with him next visit.  Recommend he check BS at home a few mornings a week with goal <130. Going to [place] until April, recommend he have A1c check while there and return to office April 2024.      Relevant Medications   atorvastatin (LIPITOR) 20 MG tablet   metFORMIN (GLUCOPHAGE) 500 MG tablet   Other Relevant Orders   Bayer DCA Hb A1c Waived (Completed)     Genitourinary   Benign prostatic hyperplasia without lower urinary tract symptoms    No current symptoms, check PSA on labs today.      Relevant Orders   PSA     Other   Low back pain    Chronic at baseline. Continue Gabapentin 300 MG QHS and may increase to 600 MG if needed (discussed at length with him to take nightly), educated him on this medication and use for pain. Use NSAID only as needed.   Recommend alternating heat/ice + placing Voltaren gel on back at home.  Continue to rest and avoid lifting until improved.  Attempted to get PT via workmen's, Worker's Comp # [phone] = [facility] and was unable to.  Will repeat imaging prior to travel, as has been some time.  May benefit from neurosurgery.      Relevant Orders   DG Lumbar Spine Complete   DG Cervical Spine Complete   Obesity    BMI 32.42.  Recommended eating smaller high protein, low fat meals more frequently and exercising 30 mins a day 5 times a week with a goal of 10-15lb weight loss in the next 3 months. Patient voiced their understanding and motivation to adhere to these  recommendations.       Relevant Medications   metFORMIN (GLUCOPHAGE) 500 MG tablet     Follow up plan: Return in about 7 months (around 09/23/2022) for T2DM, HTN/HLD, BACK PAIN.

## 2022-02-24 NOTE — Assessment & Plan Note (Signed)
Chronic, ongoing.  Continue current medication regimen and adjust as needed. Lipid panel today. 

## 2022-02-24 NOTE — Assessment & Plan Note (Addendum)
Chronic at baseline. Continue Gabapentin 300 MG QHS and may increase to 600 MG if needed (discussed at length with him to take nightly), educated him on this medication and use for pain. Use NSAID only as needed.   Recommend alternating heat/ice + placing Voltaren gel on back at home.  Continue to rest and avoid lifting until improved.  Attempted to get PT via workmen's, Worker's Comp # [phone] = [facility] and was unable to.  Will repeat imaging prior to travel, as has been some time.  May benefit from neurosurgery.

## 2022-02-24 NOTE — Assessment & Plan Note (Signed)
No current symptoms, check PSA on labs today.

## 2022-02-24 NOTE — Assessment & Plan Note (Signed)
BMI 32.42.  Recommended eating smaller high protein, low fat meals more frequently and exercising 30 mins a day 5 times a week with a goal of 10-15lb weight loss in the next 3 months. Patient voiced their understanding and motivation to adhere to these recommendations.

## 2022-02-24 NOTE — Assessment & Plan Note (Signed)
Diagnosed in August 2022, A1c today trending up at 7.6% and urine ALB 30 (June 2023).  Continue Metformin 500 MG BID (does not want to adjust today as leaving country for some months -- will work on diet) and consider addition of ACE/ARB in future -- although would monitor closely with his current stable BP -- will further discuss with him next visit.  Recommend he check BS at home a few mornings a week with goal <130. Going to [place] until April, recommend he have A1c check while there and return to office April 2024.

## 2022-02-25 LAB — LIPID PANEL W/O CHOL/HDL RATIO
Cholesterol, Total: 157 mg/dL (ref 100–199)
HDL: 36 mg/dL — ABNORMAL LOW (ref >39)
LDL Chol Calc (NIH): 92 mg/dL (ref 0–99)
Triglycerides: 168 mg/dL — ABNORMAL HIGH (ref 0–149)
VLDL Cholesterol Cal: 29 mg/dL (ref 5–40)

## 2022-02-25 LAB — PSA: Prostate Specific Ag, Serum: 3.9 ng/mL (ref 0.0–4.0)

## 2022-02-25 NOTE — Progress Notes (Signed)
Contacted via [place] -- please call though to ensure he receives message:) Good evening Aylen, your labs have returned and cholesterol labs better this check, continue current Atorvastatin dosing.  Prostate lab has trended down to normal range and we will continue to monitor as is on upper end of normal.  Any questions?  Have a wonderful day and good trip.

## 2022-03-20 ENCOUNTER — Ambulatory Visit
Admission: RE | Admit: 2022-03-20 | Discharge: 2022-03-20 | Disposition: A | Discharge status: Home / Self Care | Payer: Medicare Other | Attending: Nurse Practitioner | Admitting: Nurse Practitioner

## 2022-03-20 ENCOUNTER — Ambulatory Visit
Admission: RE | Admit: 2022-03-20 | Discharge: 2022-03-20 | Disposition: A | Discharge status: Home / Self Care | Payer: Medicare Other | Source: Ambulatory Visit | Attending: Nurse Practitioner | Admitting: Nurse Practitioner

## 2022-03-20 DIAGNOSIS — G8929 Other chronic pain: Secondary | ICD-10-CM | POA: Diagnosis not present

## 2022-03-20 DIAGNOSIS — M5441 Lumbago with sciatica, right side: Secondary | ICD-10-CM | POA: Diagnosis not present

## 2022-03-20 DIAGNOSIS — M545 Low back pain, unspecified: Secondary | ICD-10-CM | POA: Diagnosis not present

## 2022-03-20 DIAGNOSIS — M8588 Other specified disorders of bone density and structure, other site: Secondary | ICD-10-CM | POA: Diagnosis not present

## 2022-03-20 DIAGNOSIS — M5442 Lumbago with sciatica, left side: Secondary | ICD-10-CM | POA: Insufficient documentation

## 2022-03-20 DIAGNOSIS — R200 Anesthesia of skin: Secondary | ICD-10-CM | POA: Diagnosis not present

## 2022-03-23 NOTE — Progress Notes (Signed)
Good morning, please let [place] know imaging to neck and lower back have returned.  Neck is showing moderate arthritis changes and narrowing + lower back shows mild arthritic changes.  Recommend continue current treatment regimen and when returns from travel may benefit from referral to orthopedics for further assessment and recommendations for treatment.  Any questions? Keep being awesome!!  Thank you for allowing me to participate in your care.  I appreciate you. Kindest regards, Shahzaib Azevedo

## 2022-03-25 NOTE — Addendum Note (Signed)
Addended by: Marnee Guarneri T on: 03/25/2022 06:51 PM   Modules accepted: Orders

## 2022-03-30 ENCOUNTER — Ambulatory Visit (INDEPENDENT_AMBULATORY_CARE_PROVIDER_SITE_OTHER): Payer: Medicare Other | Admitting: Nurse Practitioner

## 2022-03-30 ENCOUNTER — Encounter: Payer: Self-pay | Admitting: Nurse Practitioner

## 2022-03-30 ENCOUNTER — Ambulatory Visit: Payer: Medicare Other | Admitting: Nurse Practitioner

## 2022-03-30 VITALS — BP 123/73 | HR 71 | Temp 98.3°F | Ht 66.0 in | Wt 204.0 lb

## 2022-03-30 DIAGNOSIS — M5412 Radiculopathy, cervical region: Secondary | ICD-10-CM | POA: Insufficient documentation

## 2022-03-30 DIAGNOSIS — G8929 Other chronic pain: Secondary | ICD-10-CM | POA: Diagnosis not present

## 2022-03-30 DIAGNOSIS — M5442 Lumbago with sciatica, left side: Secondary | ICD-10-CM | POA: Diagnosis not present

## 2022-03-30 DIAGNOSIS — M5441 Lumbago with sciatica, right side: Secondary | ICD-10-CM

## 2022-03-30 DIAGNOSIS — Z23 Encounter for immunization: Secondary | ICD-10-CM | POA: Diagnosis not present

## 2022-03-30 DIAGNOSIS — E669 Obesity, unspecified: Secondary | ICD-10-CM

## 2022-03-30 DIAGNOSIS — E1169 Type 2 diabetes mellitus with other specified complication: Secondary | ICD-10-CM

## 2022-03-30 MED ORDER — GABAPENTIN 600 MG PO TABS: 600.0000 mg | ORAL_TABLET | Freq: Every day | ORAL | 4 refills | Status: DC

## 2022-03-30 NOTE — Progress Notes (Signed)
BP 123/73   Pulse 71   Temp 98.3 F (36.8 C) (Oral)   Ht 5\' 6"  (1.676 m)   Wt 204 lb (92.5 kg)   SpO2 95%   BMI 32.93 kg/m    Subjective:    Patient ID: Dale Morton, male    DOB: [birth year], 71 y.o.   MRN: [id number]  HPI: Dale Morton is a 71 y.o. male  Chief Complaint  Patient presents with   Arm Pain    Pt states he has been having bilateral upper arm, shoulder and upper back pain for the last 3 months. States the pain has gotten worse recently. States he experiences a tingling feeling.    BACK & SHOULDER PAIN  Acute on chronic pain present in shoulders/neck/back (flare), having numbness in arms at times with this -- more right arm.  Recent imaging performed and noted moderate degenerative disc disease and bilateral foraminal narrowing C4-C5 and C5-C6.  Lumbar spine with mild bilateral facet arthopathy L5-S1.  A referral to ortho was placed.    Chronic at baseline, since work accident in 70 or 1989.  During accident he was pulling a deck and it got broken, then pulled back during movement + has h/o falling from height during work.  Did go to physical therapy for period of time, which was beneficial -- however has not been able to get back into this due to workmen's comp not covering.     Has been using Tizanidine and Meloxicam at home + is taking Gabapentin 300 MG at night (is taking full capsule at times which helps more).  When he is doing tasks he wears back support.   Duration: months Mechanism of injury: work accident (chronic)  Location: low back Onset: chronic Severity: 8/10 at worst and at best 2/10  Quality: dull and burning across lower back + tingling down right arm at times, dependent on how he moves neck or lifts arm up Frequency: intermittent Radiation: none Aggravating factors: lifting and prolonged sitting Alleviating factors:  muscle relaxer, Gabapentin, Meloxicam Status: fluctuating Treatments attempted: Flexeril, Gabapentin, Meloxicam Relief with  NSAIDs?: moderate Nighttime pain:  no Paresthesias / decreased sensation:  no Bowel / bladder incontinence:  no Fevers:  no Dysuria / urinary frequency:  no  Relevant past medical, surgical, family and social history reviewed and updated as indicated. Interim medical history since our last visit reviewed. Allergies and medications reviewed and updated.  Review of Systems  Constitutional:  Negative for activity change, diaphoresis, fatigue and fever.  Respiratory:  Negative for cough, chest tightness, shortness of breath and wheezing.   Cardiovascular:  Negative for chest pain, palpitations and leg swelling.  Gastrointestinal: Negative.   Musculoskeletal:  Positive for arthralgias, back pain and neck pain.  Skin: Negative.   Neurological:  Positive for numbness. Negative for dizziness, tremors, facial asymmetry, weakness, light-headedness and headaches.  Psychiatric/Behavioral: Negative.      Per HPI unless specifically indicated above     Objective:    BP 123/73   Pulse 71   Temp 98.3 F (36.8 C) (Oral)   Ht 5\' 6"  (1.676 m)   Wt 204 lb (92.5 kg)   SpO2 95%   BMI 32.93 kg/m   Wt Readings from Last 3 Encounters:  03/30/22 204 lb (92.5 kg)  02/24/22 202 lb 11.2 oz (91.9 kg)  02/02/22 207 lb 12.8 oz (94.3 kg)    Physical Exam Vitals and nursing note reviewed.  Constitutional:      General: He  is not in acute distress.    Appearance: He is well-developed. He is not ill-appearing.  HENT:     Head: Normocephalic and atraumatic.     Right Ear: Hearing normal. No drainage.     Left Ear: Hearing normal. No drainage.     Mouth/Throat:     Pharynx: Uvula midline.  Eyes:     General: Lids are normal.        Right eye: No discharge.        Left eye: No discharge.     Conjunctiva/sclera: Conjunctivae normal.     Pupils: Pupils are equal, round, and reactive to light.  Neck:     Thyroid: No thyromegaly.     Vascular: No carotid bruit or JVD.     Trachea: Trachea normal.   Cardiovascular:     Rate and Rhythm: Normal rate and regular rhythm.     Heart sounds: Normal heart sounds, S1 normal and S2 normal. No murmur heard.    No gallop.  Pulmonary:     Effort: Pulmonary effort is normal. No accessory muscle usage or respiratory distress.     Breath sounds: Normal breath sounds.  Abdominal:     General: Bowel sounds are normal.     Palpations: Abdomen is soft. There is no hepatomegaly or splenomegaly.  Musculoskeletal:     Cervical back: Normal range of motion and neck supple. No erythema or rigidity. Pain with movement present. No spinous process tenderness or muscular tenderness. Normal range of motion.     Lumbar back: Tenderness present. No swelling, edema or lacerations. Decreased range of motion.     Right lower leg: No edema.     Left lower leg: No edema.     Comments: Mild tenderness on palpation lower back.  Decreased flexion/extension/lateral ROM.  Negative straight leg.  Skin:    General: Skin is warm and dry.     Capillary Refill: Capillary refill takes less than 2 seconds.  Neurological:     Mental Status: He is alert and oriented to person, place, and time.     Deep Tendon Reflexes: Reflexes are normal and symmetric.  Psychiatric:        Mood and Affect: Mood normal.        Behavior: Behavior normal.        Thought Content: Thought content normal.        Judgment: Judgment normal.    Results for orders placed or performed in visit on 02/24/22  Bayer DCA Hb A1c Waived  Result Value Ref Range   HB A1C (BAYER DCA - WAIVED) 7.6 (H) 4.8 - 5.6 %  PSA  Result Value Ref Range   Prostate Specific Ag, Serum 3.9 0.0 - 4.0 ng/mL  Lipid Panel w/o Chol/HDL Ratio  Result Value Ref Range   Cholesterol, Total 157 100 - 199 mg/dL   Triglycerides 168 (H) 0 - 149 mg/dL   HDL 36 (L) >39 mg/dL   VLDL Cholesterol Cal 29 5 - 40 mg/dL   LDL Chol Calc (NIH) 92 0 - 99 mg/dL      Assessment & Plan:   Problem List Items Addressed This Visit        Endocrine   Type 2 diabetes mellitus with obesity (HCC)   Relevant Orders   Ambulatory referral to Ophthalmology     Nervous and Auditory   Cervical radiculopathy - Primary    Chronic and ongoing at this time with moderate degenerative disc disease and bilateral foraminal narrowing  C4-C5 and C5-C6 noted on recent imaging.  Continue Gabapentin and increase to 600 MG at night, as offering benefit.  No red flags on exam today.  Will check on ortho referral and recommend he listen out for phone call from them.        Relevant Medications   gabapentin (NEURONTIN) 600 MG tablet     Other   Low back pain    Chronic at baseline. Continue Gabapentin but increase to 600 MG QHS, educated him on this medication and use for pain. Use NSAID only as needed.   Recommend alternating heat/ice + placing Voltaren gel on back at home.  Continue to rest and avoid lifting until improved.  Attempted to get PT via workmen's, Worker's Comp # [phone] = [facility] and was unable to.  May benefit from neurosurgery in future.      Other Visit Diagnoses     Flu vaccine need       Flu vaccine in office today   Relevant Orders   Flu Vaccine QUAD High Dose(Fluad) (Completed)        Follow up plan: Return for as scheduled in April.

## 2022-03-30 NOTE — Assessment & Plan Note (Signed)
Chronic at baseline. Continue Gabapentin but increase to 600 MG QHS, educated him on this medication and use for pain. Use NSAID only as needed.   Recommend alternating heat/ice + placing Voltaren gel on back at home.  Continue to rest and avoid lifting until improved.  Attempted to get PT via workmen's, Worker's Comp # [phone] = [facility] and was unable to.  May benefit from neurosurgery in future.

## 2022-03-30 NOTE — Assessment & Plan Note (Signed)
Chronic and ongoing at this time with moderate degenerative disc disease and bilateral foraminal narrowing C4-C5 and C5-C6 noted on recent imaging.  Continue Gabapentin and increase to 600 MG at night, as offering benefit.  No red flags on exam today.  Will check on ortho referral and recommend he listen out for phone call from them.

## 2022-03-30 NOTE — Patient Instructions (Signed)
Radiculopata cervical Cervical Radiculopathy  La radiculopata cervical quiere decir que un nervio del cuello (nervio cervical) est comprimido o daado. Esto puede ocurrir debido a una lesin en la columna vertebral cervical (vrtebras) del cuello, o como parte normal del envejecimiento. Esta afeccin puede causar dolor o prdida de la sensibilidad (adormecimiento) que comienza en el cuello y va hasta el brazo y los dedos. Con frecuencia, esta afeccin mejora con reposo. Tal vez sea necesario administrar un tratamiento si la afeccin no mejora. Cules son las causas? Lesin en el cuello. Abombamiento discal en la columna vertebral. Contracciones musculares sbitas (espasmos musculares). Tensin de los msculos del cuello debido al uso excesivo. Artritis. Fractura de los huesos y las articulaciones de la columna (espondiloartrosis) debido al envejecimiento. Espolones seos que se forman cerca de los nervios del cuello. Cules son los signos o sntomas? Dolor. El dolor puede tener las siguientes caractersticas: Va desde el cuello hasta el brazo y la [place]. Es muy intenso o irritante. Empeora al mover el cuello. Prdida de la sensibilidad o adormecimiento en el brazo o la mano. Debilidad en el brazo o la [place], en casos muy graves. Cmo se trata? En muchos casos, no se requiere tratamiento para esta afeccin. Con reposo, esta generalmente mejora con el tiempo. Si es Arts development officer, las opciones pueden incluir lo siguiente: Usar un collarn cervical blando durante perodos cortos. Realizar ejercicios (fisioterapia) para fortalecer los msculos del cuello. Usar medicamentos. Aplicarse inyecciones en la columna vertebral, en casos muy graves. Someterse a [place]. Esto puede ser necesario si otros tratamientos no son eficaces. El tipo de [place] que se realiza depender de la causa de Engineer, manufacturing systems. Siga estas indicaciones en su casa: Si tiene un collarn cervical  blando: selo como se lo haya indicado el mdico. Quteselo solamente como se lo haya indicado el mdico. Pregntele al mdico si puede quitarse el collarn para baarse e higienizarse. Si le permiten quitarse el collarn para baarse o higienizarse: Siga las indicaciones del mdico sobre cmo quitarse el collarn de forma segura. Para limpiar el collarn, psele un pao con agua y [place], y squelo bien. Retire las almohadillas desmontables del collarn, si las [place], [place]. Lvelas a mano con agua y [place]. Djelas que se sequen por completo antes de volver a ponerlas en el collarn. Contrlese la piel debajo del collarn para ver si hay enrojecimiento o llagas. Avsele al mdico si ve algo de lo anterior. Control del dolor     Use los medicamentos de venta libre y los recetados solamente como se lo haya indicado el mdico. Si se lo indican, aplique hielo sobre la zona dolorida. Para hacer esto: Si tiene un collarn cervical blando, quteselo como se lo haya indicado el mdico. Ponga el hielo en una bolsa plstica. Coloque una toalla entre la piel y Therapist, nutritional. Aplique el hielo durante 20 minutos, 2 o 3 veces por da. Retire el hielo si la piel se le pone de color rojo brillante. Esto es [employer]. Si no puede sentir dolor, calor o fro, tiene un mayor riesgo de que se dae la zona. Si aplicarse hielo no le Ship broker, intente Multimedia programmer. Use la fuente de calor que el mdico le recomiende, como una compresa de calor hmedo o una almohadilla trmica. Coloque una toalla entre la piel y la fuente de Freight forwarder. Aplique calor durante 20 a 30 minutos. Retire la fuente de calor si la piel se le pone de color rojo  brillante. Esto es [employer]. Si no puede sentir dolor, calor o fro, tiene un mayor riesgo de [place]. Puede intentar con un masaje suave en el cuello y los hombros. Actividad Descanse todo lo que sea necesario. Retome sus actividades normales cuando el  mdico le diga que es seguro. Haga ejercicios como se lo haya indicado el mdico o el fisioterapeuta. Es posible que Dance movement psychotherapist objetos. Pregntele a su mdico cunto Garment/textile technologist con seguridad. Indicaciones generales Use una almohada plana para dormir. No conduzca vehculos mientras [place] el collarn cervical blando. Si no tiene un collarn cervical blando, pregntele al mdico si puede conducir sin correr [employer] se cura su cuello. Pregunte al mdico si debe evitar conducir o Risk manager mquinas mientras toma los medicamentos. No fume ni consuma ningn producto que contenga nicotina o tabaco. Si necesita ayuda para dejar de consumir estos productos, consulte al mdico. Concurra a todas las visitas de seguimiento. Comunquese con un mdico si: La afeccin no mejora con el tratamiento. Solicite ayuda de inmediato si: El Holiday representative y los medicamentos no Community education officer. Pierde la sensibilidad o siente debilidad en la mano, el brazo, el rostro o la pierna. Tiene fiebre alta. Tiene el cuello rgido. No puede controlar la evacuacin de materia fecal o de pis (tiene incontinencia). Tiene dificultad para caminar, mantener el equilibrio o hablar. Resumen La radiculopata cervical quiere decir que un nervio del cuello est comprimido o daado. Un nervio puede pinzarse por un abultamiento de disco, artritis, una lesin en el cuello u otras causas. Los sntomas Research scientist (physical sciences), hormigueo o prdida de la sensibilidad que va desde el cuello hasta el brazo o la [place]. En casos muy graves, puede presentarse debilidad en el brazo o la [place]. El tratamiento puede incluir reposo, usar un collarn cervical blando y Field seismologist [place]. Es posible que necesite tomar medicamentos para Conservation officer, historic buildings. En casos muy graves, podra ser necesario aplicarse inyecciones o someterse a [place]. Esta informacin no tiene Marine scientist el consejo del mdico. Asegrese de hacerle al mdico cualquier  pregunta que tenga. Document Revised: 12/25/2020 Document Reviewed: 12/25/2020 Elsevier Patient Education  [place].

## 2022-04-07 ENCOUNTER — Telehealth: Payer: Self-pay | Admitting: Nurse Practitioner

## 2022-04-07 NOTE — Telephone Encounter (Signed)
Copied from [place] [id number]. Topic: Referral - Status >> Apr 06, 2022  5:21 PM Sabas Sous wrote: Reason for CRM: Says he is waiting on a referral to see a specialist regarding his arthritis, per his discussion with his PCP during his last visit.   Best contact: [phone] (Daughter Rhina)

## 2022-04-09 NOTE — Telephone Encounter (Signed)
Spoke with [facility]. They have referral but have not had a chance to call patient. They will call patient ASAP to schedule.

## 2022-04-10 NOTE — Telephone Encounter (Signed)
Copied from [place] [phone]. Topic: Referral - Status >> Apr 10, 2022 10:02 AM Sabas Sous wrote: Reason for CRM: Nira Conn from [place] has received the patient's referral and has reached out to him twice. He has yet to respond so they sent him a letter to his address.   Best contact: [phone]

## 2022-04-14 NOTE — Telephone Encounter (Signed)
Copied from [place] [phone]. Topic: Referral - Status >> Apr 14, 2022  9:19 AM Jonelle Sidle B wrote: Nira Conn from [place] reviewed patient office notes and unsure if this should be filed through [employer] and would like a follow up call.

## 2022-04-15 DIAGNOSIS — M75111 Incomplete rotator cuff tear or rupture of right shoulder, not specified as traumatic: Secondary | ICD-10-CM | POA: Diagnosis not present

## 2022-04-15 DIAGNOSIS — M25511 Pain in right shoulder: Secondary | ICD-10-CM | POA: Diagnosis not present

## 2022-09-19 NOTE — Patient Instructions (Signed)
Diabetes Mellitus Basics  Diabetes mellitus, or diabetes, is a long-term (chronic) disease. It occurs when the body does not properly use sugar (glucose) that is released from food after you eat. Diabetes mellitus may be caused by one or both of these problems: Your pancreas does not make enough of a hormone called insulin. Your body does not react in a normal way to the insulin that it makes. Insulin lets glucose enter cells in your body. This gives you energy. If you have diabetes, glucose cannot get into cells. This causes high blood glucose (hyperglycemia). How to treat and manage diabetes You may need to take insulin or other diabetes medicines daily to keep your glucose in balance. If you are prescribed insulin, you will learn how to give yourself insulin by injection. You may need to adjust the amount of insulin you take based on the foods that you eat. You will need to check your blood glucose levels using a glucose monitor as told by your health care provider. The readings can help determine if you have low or high blood glucose. Generally, you should have these blood glucose levels: Before meals (preprandial): 80-130 mg/dL (4.4-7.2 mmol/L). After meals (postprandial): below 180 mg/dL (10 mmol/L). Hemoglobin A1c (HbA1c) level: less than 7%. Your health care provider will set treatment goals for you. Keep all follow-up visits. This is important. Follow these instructions at home: Diabetes medicines Take your diabetes medicines every day as told by your health care provider. List your diabetes medicines here: Name of medicine: ______________________________ Amount (dose): _______________ Time (a.m./p.m.): _______________ Notes: ___________________________________ Name of medicine: ______________________________ Amount (dose): _______________ Time (a.m./p.m.): _______________ Notes: ___________________________________ Name of medicine: ______________________________ Amount (dose):  _______________ Time (a.m./p.m.): _______________ Notes: ___________________________________ Insulin If you use insulin, list the types of insulin you use here: Insulin type: ______________________________ Amount (dose): _______________ Time (a.m./p.m.): _______________Notes: ___________________________________ Insulin type: ______________________________ Amount (dose): _______________ Time (a.m./p.m.): _______________ Notes: ___________________________________ Insulin type: ______________________________ Amount (dose): _______________ Time (a.m./p.m.): _______________ Notes: ___________________________________ Insulin type: ______________________________ Amount (dose): _______________ Time (a.m./p.m.): _______________ Notes: ___________________________________ Insulin type: ______________________________ Amount (dose): _______________ Time (a.m./p.m.): _______________ Notes: ___________________________________ Managing blood glucose  Check your blood glucose levels using a glucose monitor as told by your health care provider. Write down the times that you check your glucose levels here: Time: _______________ Notes: ___________________________________ Time: _______________ Notes: ___________________________________ Time: _______________ Notes: ___________________________________ Time: _______________ Notes: ___________________________________ Time: _______________ Notes: ___________________________________ Time: _______________ Notes: ___________________________________  Low blood glucose Low blood glucose (hypoglycemia) is when glucose is at or below 70 mg/dL (3.9 mmol/L). Symptoms may include: Feeling: Hungry. Sweaty and clammy. Irritable or easily upset. Dizzy. Sleepy. Having: A fast heartbeat. A headache. A change in your vision. Numbness around the mouth, lips, or tongue. Having trouble with: Moving (coordination). Sleeping. Treating low blood glucose To treat low blood  glucose, eat or drink something containing sugar right away. If you can think clearly and swallow safely, follow the 15:15 rule: Take 15 grams of a fast-acting carb (carbohydrate), as told by your health care provider. Some fast-acting carbs are: Glucose tablets: take 3-4 tablets. Hard candy: eat 3-5 pieces. Fruit juice: drink 4 oz (120 mL). Regular (not diet) soda: drink 4-6 oz (120-180 mL). Honey or sugar: eat 1 Tbsp (15 mL). Check your blood glucose levels 15 minutes after you take the carb. If your glucose is still at or below 70 mg/dL (3.9 mmol/L), take 15 grams of a carb again. If your glucose does not go above 70 mg/dL (3.9 mmol/L) after   3 tries, get help right away. After your glucose goes back to normal, eat a meal or a snack within 1 hour. Treating very low blood glucose If your glucose is at or below 54 mg/dL (3 mmol/L), you have very low blood glucose (severe hypoglycemia). This is an emergency. Do not wait to see if the symptoms will go away. Get medical help right away. Call your local emergency services (911 in the [place] Do not drive yourself to the hospital. Questions to ask your health care provider Should I talk with a diabetes educator? What equipment will I need to care for myself at home? What diabetes medicines do I need? When should I take them? How often do I need to check my blood glucose levels? What number can I call if I have questions? When is my follow-up visit? Where can I find a support group for people with diabetes? Where to find more information American Diabetes Association: www.diabetes.org Association of Diabetes Care and Education Specialists: www.diabeteseducator.org Contact a health care provider if: Your blood glucose is at or above 240 mg/dL (13.3 mmol/L) for 2 days in a row. You have been sick or have had a fever for 2 days or more, and you are not getting better. You have any of these problems for more than 6 hours: You cannot eat or  drink. You feel nauseous. You vomit. You have diarrhea. Get help right away if: Your blood glucose is lower than 54 mg/dL (3 mmol/L). You get confused. You have trouble thinking clearly. You have trouble breathing. These symptoms may represent a serious problem that is an emergency. Do not wait to see if the symptoms will go away. Get medical help right away. Call your local emergency services (911 in the [place] Do not drive yourself to the hospital. Summary Diabetes mellitus is a chronic disease that occurs when the body does not properly use sugar (glucose) that is released from food after you eat. Take insulin and diabetes medicines as told. Check your blood glucose every day, as often as told. Keep all follow-up visits. This is important. This information is not intended to replace advice given to you by your health care provider. Make sure you discuss any questions you have with your health care provider. Document Revised: 09/26/2019 Document Reviewed: 09/26/2019 Elsevier Patient Education  2023 Elsevier Inc.  

## 2022-09-25 ENCOUNTER — Encounter: Payer: Self-pay | Admitting: Nurse Practitioner

## 2022-09-25 ENCOUNTER — Ambulatory Visit (INDEPENDENT_AMBULATORY_CARE_PROVIDER_SITE_OTHER): Payer: Medicare Other | Admitting: Nurse Practitioner

## 2022-09-25 VITALS — BP 121/77 | HR 66 | Temp 97.7°F | Ht 65.98 in | Wt 192.6 lb

## 2022-09-25 DIAGNOSIS — G8929 Other chronic pain: Secondary | ICD-10-CM | POA: Diagnosis not present

## 2022-09-25 DIAGNOSIS — K219 Gastro-esophageal reflux disease without esophagitis: Secondary | ICD-10-CM

## 2022-09-25 DIAGNOSIS — Z6832 Body mass index (BMI) 32.0-32.9, adult: Secondary | ICD-10-CM

## 2022-09-25 DIAGNOSIS — E669 Obesity, unspecified: Secondary | ICD-10-CM

## 2022-09-25 DIAGNOSIS — E6609 Other obesity due to excess calories: Secondary | ICD-10-CM

## 2022-09-25 DIAGNOSIS — M5442 Lumbago with sciatica, left side: Secondary | ICD-10-CM

## 2022-09-25 DIAGNOSIS — M5441 Lumbago with sciatica, right side: Secondary | ICD-10-CM

## 2022-09-25 DIAGNOSIS — E1169 Type 2 diabetes mellitus with other specified complication: Secondary | ICD-10-CM

## 2022-09-25 DIAGNOSIS — E785 Hyperlipidemia, unspecified: Secondary | ICD-10-CM | POA: Diagnosis not present

## 2022-09-25 LAB — MICROALBUMIN, URINE WAIVED
Creatinine, Urine Waived: 300 mg/dL (ref 10–300)
Microalb, Ur Waived: 80 mg/L — ABNORMAL HIGH (ref 0–19)

## 2022-09-25 LAB — BAYER DCA HB A1C WAIVED: HB A1C (BAYER DCA - WAIVED): 7.5 % — ABNORMAL HIGH (ref 4.8–5.6)

## 2022-09-25 NOTE — Assessment & Plan Note (Signed)
Chronic, ongoing.  Continue current medication regimen and adjust as needed. Lipid panel today. 

## 2022-09-25 NOTE — Assessment & Plan Note (Signed)
Diagnosed in August 2022, A1c today trending down slightly at 7.5% and urine ALB 80 (April 2024).  Continue Metformin 500 MG BID (have recommended he take as ordered twice a day) and consider addition of ACE/ARB in future -- although would monitor closely with his current stable BP -- will further discuss with him next visit.  Recommend he check BS at home a few mornings a week with goal <130. Return in 3 months.

## 2022-09-25 NOTE — Progress Notes (Signed)
BP 121/77   Pulse 66   Temp 97.7 F (36.5 C) (Oral)   Ht 5' 5.98" (1.676 m)   Wt 192 lb 9.6 oz (87.4 kg)   SpO2 95%   BMI 31.10 kg/m    Subjective:    Patient ID: Dale Morton, male    DOB: 1951-01-10, 72 y.o.   MRN: [id number]  HPI: Dale Morton is a 72 y.o. male  Chief Complaint  Patient presents with   Diabetes   Hypertension   Hyperlipidemia   Back Pain   DIABETES Diagnosed in August 2022.  A1c 7.6% in September.  Continues Metformin 500 MG BID (he reports taking every other day, if takes as ordered feels dry mouth or sour taste) and Atorvastatin 20 MG daily for HLD (takes every other day).  Just returned from [place] Hypoglycemic episodes:no Polydipsia/polyuria: no Visual disturbance: no Chest pain: no Paresthesias: no Glucose Monitoring: no  Accucheck frequency: Not Checking  Fasting glucose:  Post prandial:  Evening:  Before meals: Taking Insulin?: no  Long acting insulin:  Short acting insulin: Blood Pressure Monitoring: not checking Retinal Examination: Up To Date -- [employer] Foot Exam: Up to Date Pneumovax: Up to Date Influenza: Up to Date Aspirin: no  The 10-year ASCVD risk score (Arnett DK, et al., 2019) is: 30.1%   Values used to calculate the score:     Age: 52 years     Sex: Male     Is Non-Hispanic African American: No     Diabetic: Yes     Tobacco smoker: No     Systolic Blood Pressure: 121 mmHg     Is BP treated: No     HDL Cholesterol: 36 mg/dL     Total Cholesterol: 157 mg/dL  GERD Continues on Omeprazole, takes only as needed. GERD control status: stable Satisfied with current treatment? yes Heartburn frequency: improved Medication side effects: no  Medication compliance: stable Dysphagia: no Odynophagia:  no Hematemesis: no Blood in stool: no EGD: no   BACK & SHOULDER PAIN  History:  Chronic at baseline, since work accident in 62 or 1989.  During accident he was pulling a deck and it got broken, then pulled back  during movement + has h/o falling from height during work.  Did go to physical therapy for period of time, which was beneficial -- however has not been able to get back into this due to workmen's comp not covering.  Last imaging was in October 2023 with degenerative changes to lumbar and thoracic spine.    Did do physical therapy in [place] recently for right shoulder and received injection which helped.     Has been using Tizanidine and Meloxicam at home + is taking Gabapentin 300 MG at night.  When he is doing tasks he wears back support.  Was given Super Flex [place] Duration: months Mechanism of injury: work accident (chronic)  Location: low back and right shoulder Onset: chronic Severity: 5/10 at worst and at best 3/10  Quality: dull and burning across lower back Frequency: intermittent Radiation: none Aggravating factors: lifting and prolonged sitting Alleviating factors:  muscle relaxer Status: fluctuating Treatments attempted: Flexeril, Tizanidine, Meloxicam, Gabapentin Relief with NSAIDs?: moderate Nighttime pain:  no Paresthesias / decreased sensation:  no Bowel / bladder incontinence:  no Fevers:  no Dysuria / urinary frequency:  no  Relevant past medical, surgical, family and social history reviewed and updated as indicated. Interim medical history since our last visit reviewed. Allergies and  medications reviewed and updated.  Review of Systems  Constitutional:  Negative for activity change, diaphoresis, fatigue and fever.  Respiratory:  Negative for cough, chest tightness, shortness of breath and wheezing.   Cardiovascular:  Negative for chest pain, palpitations and leg swelling.  Gastrointestinal: Negative.   Musculoskeletal:  Positive for arthralgias and back pain.  Skin: Negative.   Neurological:  Positive for numbness. Negative for dizziness, tremors, facial asymmetry, weakness, light-headedness and headaches.  Psychiatric/Behavioral: Negative.     Per  HPI unless specifically indicated above    Objective:    BP 121/77   Pulse 66   Temp 97.7 F (36.5 C) (Oral)   Ht 5' 5.98" (1.676 m)   Wt 192 lb 9.6 oz (87.4 kg)   SpO2 95%   BMI 31.10 kg/m   Wt Readings from Last 3 Encounters:  09/25/22 192 lb 9.6 oz (87.4 kg)  03/30/22 204 lb (92.5 kg)  02/24/22 202 lb 11.2 oz (91.9 kg)    Physical Exam Vitals and nursing note reviewed.  Constitutional:      General: He is not in acute distress.    Appearance: He is well-developed. He is not ill-appearing.  HENT:     Head: Normocephalic and atraumatic.     Right Ear: Hearing normal. No drainage.     Left Ear: Hearing normal. No drainage.     Mouth/Throat:     Pharynx: Uvula midline.  Eyes:     General: Lids are normal.        Right eye: No discharge.        Left eye: No discharge.     Conjunctiva/sclera: Conjunctivae normal.     Pupils: Pupils are equal, round, and reactive to light.  Neck:     Thyroid: No thyromegaly.     Vascular: No carotid bruit or JVD.     Trachea: Trachea normal.  Cardiovascular:     Rate and Rhythm: Normal rate and regular rhythm.     Heart sounds: Normal heart sounds, S1 normal and S2 normal. No murmur heard.    No gallop.  Pulmonary:     Effort: Pulmonary effort is normal. No accessory muscle usage or respiratory distress.     Breath sounds: Normal breath sounds.  Abdominal:     General: Bowel sounds are normal.     Palpations: Abdomen is soft. There is no hepatomegaly or splenomegaly.  Musculoskeletal:     Cervical back: Normal range of motion and neck supple. No erythema or rigidity. No spinous process tenderness or muscular tenderness. Normal range of motion.     Lumbar back: Tenderness present. No swelling, edema or lacerations. Decreased range of motion.     Right lower leg: No edema.     Left lower leg: No edema.     Comments: Decreased flexion/extension/lateral ROM.  Negative straight leg.  Skin:    General: Skin is warm and dry.      Capillary Refill: Capillary refill takes less than 2 seconds.  Neurological:     Mental Status: He is alert and oriented to person, place, and time.     Deep Tendon Reflexes: Reflexes are normal and symmetric.  Psychiatric:        Mood and Affect: Mood normal.        Behavior: Behavior normal.        Thought Content: Thought content normal.        Judgment: Judgment normal.    Diabetic Foot Exam - Simple   Simple Foot  Form Visual Inspection No deformities, no ulcerations, no other skin breakdown bilaterally: Yes Sensation Testing Intact to touch and monofilament testing bilaterally: Yes Pulse Check Posterior Tibialis and Dorsalis pulse intact bilaterally: Yes Comments     Results for orders placed or performed in visit on 09/25/22  Bayer DCA Hb A1c Waived  Result Value Ref Range   HB A1C (BAYER DCA - WAIVED) 7.5 (H) 4.8 - 5.6 %  Microalbumin, Urine Waived  Result Value Ref Range   Microalb, Ur Waived 80 (H) 0 - 19 mg/L   Creatinine, Urine Waived 300 10 - 300 mg/dL   Microalb/Creat Ratio 30-300 (H) <30 mg/g      Assessment & Plan:   Problem List Items Addressed This Visit       Digestive   Gastroesophageal reflux disease without esophagitis    Improved at this time, continue Omeprazole as needed and adjust if needed in future.  Recommend heavy focus on diet changes at home.  Mag level annually.  Risks of PPI use were discussed with patient including bone loss, C. Diff diarrhea, pneumonia, infections, CKD, electrolyte abnormalities.  Verbalizes understanding and chooses to continue the medication.         Endocrine   Hyperlipidemia associated with type 2 diabetes mellitus    Chronic, ongoing.  Continue current medication regimen and adjust as needed. Lipid panel today.       Relevant Medications   lisinopril (ZESTRIL) 5 MG tablet   Other Relevant Orders   Bayer DCA Hb A1c Waived (Completed)   Microalbumin, Urine Waived (Completed)   Comprehensive metabolic panel    Lipid Panel w/o Chol/HDL Ratio   Type 2 diabetes mellitus with obesity - Primary    Diagnosed in August 2022, A1c today trending down slightly at 7.5% and urine ALB 80 (April 2024).  Continue Metformin 500 MG BID (have recommended he take as ordered twice a day) and consider addition of ACE/ARB in future -- although would monitor closely with his current stable BP -- will further discuss with him next visit.  Recommend he check BS at home a few mornings a week with goal <130. Return in 3 months.      Relevant Medications   lisinopril (ZESTRIL) 5 MG tablet   Other Relevant Orders   Bayer DCA Hb A1c Waived (Completed)   Microalbumin, Urine Waived (Completed)   TSH     Other   Low back pain    Chronic at baseline. Continue Gabapentin 300 MG QHS, educated him on this medication and use for pain. Use NSAID only as needed.   Recommend alternating heat/ice + placing Voltaren gel on back at home.  Continue to rest and avoid lifting until improved.  Worker's Comp # [phone] = [employer].  May benefit from neurosurgery in future.      Obesity    BMI 31.10.  Recommended eating smaller high protein, low fat meals more frequently and exercising 30 mins a day 5 times a week with a goal of 10-15lb weight loss in the next 3 months. Patient voiced their understanding and motivation to adhere to these recommendations.         Follow up plan: Return in about 3 months (around 12/25/2022) for T2DM, HLD, CHRONIC BACK PAIN.

## 2022-09-25 NOTE — Assessment & Plan Note (Signed)
Chronic at baseline. Continue Gabapentin 300 MG QHS, educated him on this medication and use for pain. Use NSAID only as needed.   Recommend alternating heat/ice + placing Voltaren gel on back at home.  Continue to rest and avoid lifting until improved.  Worker's Comp # [phone] = [employer].  May benefit from neurosurgery in future.

## 2022-09-25 NOTE — Assessment & Plan Note (Signed)
Improved at this time, continue Omeprazole as needed and adjust if needed in future.  Recommend heavy focus on diet changes at home.  Mag level annually.  Risks of PPI use were discussed with patient including bone loss, C. Diff diarrhea, pneumonia, infections, CKD, electrolyte abnormalities.  Verbalizes understanding and chooses to continue the medication.

## 2022-09-25 NOTE — Assessment & Plan Note (Signed)
BMI 31.10.  Recommended eating smaller high protein, low fat meals more frequently and exercising 30 mins a day 5 times a week with a goal of 10-15lb weight loss in the next 3 months. Patient voiced their understanding and motivation to adhere to these recommendations. ? ?

## 2022-09-26 LAB — COMPREHENSIVE METABOLIC PANEL
ALT: 33 IU/L (ref 0–44)
AST: 29 IU/L (ref 0–40)
Albumin/Globulin Ratio: 1.7 (ref 1.2–2.2)
Albumin: 4.7 g/dL (ref 3.9–4.9)
Alkaline Phosphatase: 135 IU/L — ABNORMAL HIGH (ref 44–121)
BUN/Creatinine Ratio: 15 (ref 10–24)
BUN: 11 mg/dL (ref 8–27)
Bilirubin Total: 0.5 mg/dL (ref 0.0–1.2)
CO2: 25 mmol/L (ref 20–29)
Calcium: 9.4 mg/dL (ref 8.6–10.2)
Chloride: 100 mmol/L (ref 96–106)
Creatinine, Ser: 0.73 mg/dL — ABNORMAL LOW (ref 0.76–1.27)
Globulin, Total: 2.7 g/dL (ref 1.5–4.5)
Glucose: 115 mg/dL — ABNORMAL HIGH (ref 70–99)
Potassium: 4.2 mmol/L (ref 3.5–5.2)
Sodium: 138 mmol/L (ref 134–144)
Total Protein: 7.4 g/dL (ref 6.0–8.5)
eGFR: 98 mL/min/{1.73_m2} (ref >59)

## 2022-09-26 LAB — LIPID PANEL W/O CHOL/HDL RATIO
Cholesterol, Total: 212 mg/dL — ABNORMAL HIGH (ref 100–199)
HDL: 42 mg/dL (ref >39)
LDL Chol Calc (NIH): 141 mg/dL — ABNORMAL HIGH (ref 0–99)
Triglycerides: 163 mg/dL — ABNORMAL HIGH (ref 0–149)
VLDL Cholesterol Cal: 29 mg/dL (ref 5–40)

## 2022-09-26 LAB — TSH: TSH: 2.4 u[IU]/mL (ref 0.450–4.500)

## 2022-09-27 NOTE — Progress Notes (Signed)
Contacted via MyChart -- however please have someone call and alert him as not sure he checks this and need to ensure he is taking medications as instructed.  Good morning Dale Morton, your labs have returned: - A1c is still elevated at 7.5% -- please ensure you are taking Metformin at least daily and try to take as ordered twice a day -- we may need to change medications next visit if you can not tolerate.  However, we want this under 7%. - Cholesterol levels remain above goal, would like to see LDL less then 70.  Please take Atorvastatin as ordered daily and if you can not tolerate let me know and we will try changing to Rosuvastatin which may be better tolerated.   - Thyroid level is normal - Kidney and liver function are normal.  Any questions? Keep being amazing!!  Thank you for allowing me to participate in your care.  I appreciate you. Kindest regards, Jeyda Siebel

## 2022-11-12 ENCOUNTER — Ambulatory Visit (INDEPENDENT_AMBULATORY_CARE_PROVIDER_SITE_OTHER): Payer: Worker's Compensation | Admitting: Physician Assistant

## 2022-11-12 ENCOUNTER — Encounter: Payer: Self-pay | Admitting: Physician Assistant

## 2022-11-12 VITALS — BP 122/68 | HR 70 | Temp 98.6°F | Wt 197.6 lb

## 2022-11-12 DIAGNOSIS — G8929 Other chronic pain: Secondary | ICD-10-CM | POA: Diagnosis not present

## 2022-11-12 DIAGNOSIS — M5441 Lumbago with sciatica, right side: Secondary | ICD-10-CM | POA: Diagnosis not present

## 2022-11-12 DIAGNOSIS — M778 Other enthesopathies, not elsewhere classified: Secondary | ICD-10-CM

## 2022-11-12 DIAGNOSIS — M5442 Lumbago with sciatica, left side: Secondary | ICD-10-CM

## 2022-11-12 MED ORDER — TIZANIDINE HCL 4 MG PO TABS: 4.0000 mg | ORAL_TABLET | Freq: Four times a day (QID) | ORAL | 0 refills | Status: DC | PRN

## 2022-11-12 NOTE — Assessment & Plan Note (Addendum)
Chronic, historic condition, reports current exacerbation of pain with left sided sciatica not currently managed with any medications per patient  PE is reassuring- suspect chronic muscular etiology at this time  Recommend conservative measures- warm compresses, gentle stretches and massage, Tylenol and Ibuprofen PRN for pain Will provide refill of tizanidine 4 mg PO Q6H PRN -reviewed side effects and recommend pt only take at night to avoid drowsiness Provided stretches for him to complete at home- may need referral to PT if symptoms are not improving Reviewed follow up precautions Follow up as needed for persistent or progressing symptoms

## 2022-11-12 NOTE — Progress Notes (Signed)
Acute Office Visit   Patient: Dale Morton   DOB: [birth year]   72 y.o. Male  MRN: [id number] Visit Date: 11/12/2022  Today's healthcare provider: Oswaldo Conroy Lailah Marcelli, PA-C  Introduced myself to the patient as a Secondary school teacher and provided education on APPs in clinical practice.    Chief Complaint  Patient presents with   Shoulder Pain    Pt states he has been having R arm and shoulder pain. States he has been experiencing the pain for the last 3 months. States he feels the pain more when he moves his arm.    Back Pain   Subjective    HPI HPI     Shoulder Pain    Additional comments: Pt states he has been having R arm and shoulder pain. States he has been experiencing the pain for the last 3 months. States he feels the pain more when he moves his arm.       Last edited by Pablo Ledger, CMA on 11/12/2022  9:10 AM.      Patient has access to translator services : translator Ignacia Bayley [phone]   Right arm pain   Onset: gradual  Duration: about 2 weeks  Location: right shoulder along deltoid  Radiation: mild radiation to right elbow  He denies numbness or tinging  Pain level and character: 10/10 with raising it  Other associated symptoms: none  Interventions: 3 months ago he had injections in his arm in [place] He has tried taking a supplement he ordered from [employer] but this has not been helping  Alleviating: nothing  Aggravating: hurts worse with lifting arm    Back pain   Onset: gradual   Duration: has been ongoing for some time but has gotten worse over the last 15 days  Location: lower back pain  Radiation: he reports some numbness and tingling to left knee  Pain level and character: sitting 6/10, persistent pain until he changes position  Other associated symptoms:  Interventions: nothing  Alleviating: feels better with ambulation  Aggravating: hurts worse with laying down on sofa and hammock   He saw PCP in April for low back back and was rx gabapentin  He was  evaluated by Ortho for left sided rotator cuff injury but there does not appear to be follow up afterwards He denies falling or any recent trauma to arm to back  He reports a fall in [place] and hitting shoulder but he states the pain was there prior to that incident   Medications: Outpatient Medications Prior to Visit  Medication Sig   atorvastatin (LIPITOR) 20 MG tablet Take 1 tablet (20 mg total) by mouth daily.   Blood Glucose Monitoring Suppl (ONETOUCH VERIO) w/Device KIT Use to check blood sugar 3 times a day and document results, bring to appointments.  Goal is <130 fasting blood sugar and <180 two hours after meals.   glucose blood test strip Use to check blood sugar 3 times daily, fasting in morning with goal <130 and 2 hours after meals with goal <180.  Bring blood sugar log to visits.   metFORMIN (GLUCOPHAGE) 500 MG tablet Take 1 tablet (500 mg total) by mouth 2 (two) times daily with a meal.   omeprazole (PRILOSEC) 40 MG capsule Take 1 capsule (40 mg total) by mouth daily.   OneTouch Delica Lancets 33G MISC To check blood sugar prior to eating in morning with goal of less then 130.  Document for visits.  gabapentin (NEURONTIN) 600 MG tablet Take 1 tablet (600 mg total) by mouth at bedtime. (Patient not taking: Reported on 11/12/2022)   lisinopril (ZESTRIL) 5 MG tablet Take 5 mg by mouth daily. (Patient not taking: Reported on 11/12/2022)   meloxicam (MOBIC) 15 MG tablet Take 15 mg by mouth daily. (Patient not taking: Reported on 11/12/2022)   [DISCONTINUED] tiZANidine (ZANAFLEX) 4 MG tablet Take 1 tablet (4 mg total) by mouth every 6 (six) hours as needed for muscle spasms. (Patient not taking: Reported on 09/25/2022)   No facility-administered medications prior to visit.    Review of Systems  Musculoskeletal:  Positive for arthralgias, back pain and myalgias.  Neurological:  Positive for numbness.       Objective    BP 122/68   Pulse 70   Temp 98.6 F (37 C) (Oral)   Wt  197 lb 9.6 oz (89.6 kg)   SpO2 98%   BMI 31.91 kg/m    Physical Exam Vitals reviewed.  Constitutional:      General: He is awake.     Appearance: Normal appearance. He is well-developed and well-groomed.  HENT:     Head: Normocephalic and atraumatic.  Eyes:     Extraocular Movements: Extraocular movements intact.  Pulmonary:     Effort: Pulmonary effort is normal.  Musculoskeletal:     Right shoulder: Tenderness and crepitus present. No deformity, effusion or bony tenderness. Normal range of motion. Normal strength.     Left shoulder: Normal. Normal range of motion.     Cervical back: Normal range of motion.     Thoracic back: Normal range of motion.     Lumbar back: Tenderness present. No swelling or spasms. Normal range of motion. Negative right straight leg raise test and negative left straight leg raise test.       Back:     Comments: Right Shoulder Mild tenderness with abduction and adduction but ROM is intact at right shoulder  Passive and active ROM are overall intact but there is pain with above motions  Mildly positive empty can test on right  Negative drop arm test on right   Lower back No palpable slip offs or deformities of midline  ROM is normal with lateral flexion, lateral rotation, as well as flexion and extension at lumbar spine  He reports more tenderness with flexion than extension Hip ROM is intact bilaterally with flexion, abduction, adduction  Gait is intact   Skin:    General: Skin is warm and dry.  Neurological:     General: No focal deficit present.     Mental Status: He is alert and oriented to person, place, and time.  Psychiatric:        Mood and Affect: Mood normal.        Behavior: Behavior normal. Behavior is cooperative.        Thought Content: Thought content normal.       No results found for any visits on 11/12/22.  Assessment & Plan      No follow-ups on file.       Problem List Items Addressed This Visit       Other    Low back pain    Chronic, historic condition, reports current exacerbation of pain with left sided sciatica not currently managed with any medications per patient  PE is reassuring- suspect chronic muscular etiology at this time  Recommend conservative measures- warm compresses, gentle stretches and massage, Tylenol and Ibuprofen PRN for pain Will provide  refill of tizanidine 4 mg PO Q6H PRN -reviewed side effects and recommend pt only take at night to avoid drowsiness Provided stretches for him to complete at home- may need referral to PT if symptoms are not improving Reviewed follow up precautions Follow up as needed for persistent or progressing symptoms         Relevant Medications   tiZANidine (ZANAFLEX) 4 MG tablet   Other Visit Diagnoses     Capsulitis of right shoulder    -  Primary Acute, new concern Reports reduced ROM and pain in right shoulder PE was overall reassuring- suspect frozen shoulder vs rotator cuff injury given active ROM results during apt Recommend gentle stretching and massage, warm compresses- explained importance of maintaining ROM to prevent progression Recommend Tylenol and Ibuprofen for pain management May need follow up with Ortho if symptoms persist or worsen Follow up as needed for persistent or progressing symptoms          No follow-ups on file.   I, Lovie Agresta E Tallis Soledad, PA-C, have reviewed all documentation for this visit. The documentation on 11/12/22 for the exam, diagnosis, procedures, and orders are all accurate and complete.   Jacquelin Hawking, MHS, PA-C [facility]

## 2022-11-12 NOTE — Patient Instructions (Addendum)
I recommend the following to help with your pain in your shoulder and back   Rest Warm compresses to the area (20 minutes on, minimum of 30 minutes off) You can alternate Tylenol and Ibuprofen for pain management but Ibuprofen is typically preferred to reduce inflammation.  Try to stay well hydrated while using Ibuprofen  I have sent in a script for a muscle relaxer for you to use at night- this can make you sleepy so do not use when you need to be alert  Gentle stretches and exercises that I have included in your paperwork Try to reduce excess strain to the area and rest as much as possible  Wear supportive shoes and, if you must lift anything, use proper lifting techniques that spare your back.   If these measures do not lead to improvement in your symptoms over the next 2-4 weeks please let us know

## 2022-12-20 NOTE — Patient Instructions (Signed)
Diabetes Mellitus Basics  Diabetes mellitus, or diabetes, is a long-term (chronic) disease. It occurs when the body does not properly use sugar (glucose) that is released from food after you eat. Diabetes mellitus may be caused by one or both of these problems: Your pancreas does not make enough of a hormone called insulin. Your body does not react in a normal way to the insulin that it makes. Insulin lets glucose enter cells in your body. This gives you energy. If you have diabetes, glucose cannot get into cells. This causes high blood glucose (hyperglycemia). How to treat and manage diabetes You may need to take insulin or other diabetes medicines daily to keep your glucose in balance. If you are prescribed insulin, you will learn how to give yourself insulin by injection. You may need to adjust the amount of insulin you take based on the foods that you eat. You will need to check your blood glucose levels using a glucose monitor as told by your health care provider. The readings can help determine if you have low or high blood glucose. Generally, you should have these blood glucose levels: Before meals (preprandial): 80-130 mg/dL (4.4-7.2 mmol/L). After meals (postprandial): below 180 mg/dL (10 mmol/L). Hemoglobin A1c (HbA1c) level: less than 7%. Your health care provider will set treatment goals for you. Keep all follow-up visits. This is important. Follow these instructions at home: Diabetes medicines Take your diabetes medicines every day as told by your health care provider. List your diabetes medicines here: Name of medicine: ______________________________ Amount (dose): _______________ Time (a.m./p.m.): _______________ Notes: ___________________________________ Name of medicine: ______________________________ Amount (dose): _______________ Time (a.m./p.m.): _______________ Notes: ___________________________________ Name of medicine: ______________________________ Amount (dose):  _______________ Time (a.m./p.m.): _______________ Notes: ___________________________________ Insulin If you use insulin, list the types of insulin you use here: Insulin type: ______________________________ Amount (dose): _______________ Time (a.m./p.m.): _______________Notes: ___________________________________ Insulin type: ______________________________ Amount (dose): _______________ Time (a.m./p.m.): _______________ Notes: ___________________________________ Insulin type: ______________________________ Amount (dose): _______________ Time (a.m./p.m.): _______________ Notes: ___________________________________ Insulin type: ______________________________ Amount (dose): _______________ Time (a.m./p.m.): _______________ Notes: ___________________________________ Insulin type: ______________________________ Amount (dose): _______________ Time (a.m./p.m.): _______________ Notes: ___________________________________ Managing blood glucose  Check your blood glucose levels using a glucose monitor as told by your health care provider. Write down the times that you check your glucose levels here: Time: _______________ Notes: ___________________________________ Time: _______________ Notes: ___________________________________ Time: _______________ Notes: ___________________________________ Time: _______________ Notes: ___________________________________ Time: _______________ Notes: ___________________________________ Time: _______________ Notes: ___________________________________  Low blood glucose Low blood glucose (hypoglycemia) is when glucose is at or below 70 mg/dL (3.9 mmol/L). Symptoms may include: Feeling: Hungry. Sweaty and clammy. Irritable or easily upset. Dizzy. Sleepy. Having: A fast heartbeat. A headache. A change in your vision. Numbness around the mouth, lips, or tongue. Having trouble with: Moving (coordination). Sleeping. Treating low blood glucose To treat low blood  glucose, eat or drink something containing sugar right away. If you can think clearly and swallow safely, follow the 15:15 rule: Take 15 grams of a fast-acting carb (carbohydrate), as told by your health care provider. Some fast-acting carbs are: Glucose tablets: take 3-4 tablets. Hard candy: eat 3-5 pieces. Fruit juice: drink 4 oz (120 mL). Regular (not diet) soda: drink 4-6 oz (120-180 mL). Honey or sugar: eat 1 Tbsp (15 mL). Check your blood glucose levels 15 minutes after you take the carb. If your glucose is still at or below 70 mg/dL (3.9 mmol/L), take 15 grams of a carb again. If your glucose does not go above 70 mg/dL (3.9 mmol/L) after   3 tries, get help right away. After your glucose goes back to normal, eat a meal or a snack within 1 hour. Treating very low blood glucose If your glucose is at or below 54 mg/dL (3 mmol/L), you have very low blood glucose (severe hypoglycemia). This is an emergency. Do not wait to see if the symptoms will go away. Get medical help right away. Call your local emergency services (911 in the [place] Do not drive yourself to the hospital. Questions to ask your health care provider Should I talk with a diabetes educator? What equipment will I need to care for myself at home? What diabetes medicines do I need? When should I take them? How often do I need to check my blood glucose levels? What number can I call if I have questions? When is my follow-up visit? Where can I find a support group for people with diabetes? Where to find more information American Diabetes Association: www.diabetes.org Association of Diabetes Care and Education Specialists: www.diabeteseducator.org Contact a health care provider if: Your blood glucose is at or above 240 mg/dL (13.3 mmol/L) for 2 days in a row. You have been sick or have had a fever for 2 days or more, and you are not getting better. You have any of these problems for more than 6 hours: You cannot eat or  drink. You feel nauseous. You vomit. You have diarrhea. Get help right away if: Your blood glucose is lower than 54 mg/dL (3 mmol/L). You get confused. You have trouble thinking clearly. You have trouble breathing. These symptoms may represent a serious problem that is an emergency. Do not wait to see if the symptoms will go away. Get medical help right away. Call your local emergency services (911 in the [place] Do not drive yourself to the hospital. Summary Diabetes mellitus is a chronic disease that occurs when the body does not properly use sugar (glucose) that is released from food after you eat. Take insulin and diabetes medicines as told. Check your blood glucose every day, as often as told. Keep all follow-up visits. This is important. This information is not intended to replace advice given to you by your health care provider. Make sure you discuss any questions you have with your health care provider. Document Revised: 09/26/2019 Document Reviewed: 09/26/2019 Elsevier Patient Education  2024 Elsevier Inc.  

## 2022-12-25 ENCOUNTER — Other Ambulatory Visit: Payer: Self-pay | Admitting: Nurse Practitioner

## 2022-12-25 ENCOUNTER — Encounter: Payer: Self-pay | Admitting: Nurse Practitioner

## 2022-12-25 ENCOUNTER — Ambulatory Visit (INDEPENDENT_AMBULATORY_CARE_PROVIDER_SITE_OTHER): Payer: Medicare Other | Admitting: Nurse Practitioner

## 2022-12-25 VITALS — BP 122/69 | HR 70 | Temp 97.9°F | Ht 65.98 in | Wt 199.4 lb

## 2022-12-25 DIAGNOSIS — M5442 Lumbago with sciatica, left side: Secondary | ICD-10-CM | POA: Diagnosis not present

## 2022-12-25 DIAGNOSIS — Z23 Encounter for immunization: Secondary | ICD-10-CM | POA: Diagnosis not present

## 2022-12-25 DIAGNOSIS — K219 Gastro-esophageal reflux disease without esophagitis: Secondary | ICD-10-CM

## 2022-12-25 DIAGNOSIS — M5441 Lumbago with sciatica, right side: Secondary | ICD-10-CM

## 2022-12-25 DIAGNOSIS — E785 Hyperlipidemia, unspecified: Secondary | ICD-10-CM | POA: Diagnosis not present

## 2022-12-25 DIAGNOSIS — G8929 Other chronic pain: Secondary | ICD-10-CM

## 2022-12-25 DIAGNOSIS — N400 Enlarged prostate without lower urinary tract symptoms: Secondary | ICD-10-CM | POA: Diagnosis not present

## 2022-12-25 DIAGNOSIS — E1169 Type 2 diabetes mellitus with other specified complication: Secondary | ICD-10-CM

## 2022-12-25 DIAGNOSIS — E1129 Type 2 diabetes mellitus with other diabetic kidney complication: Secondary | ICD-10-CM | POA: Diagnosis not present

## 2022-12-25 DIAGNOSIS — R809 Proteinuria, unspecified: Secondary | ICD-10-CM | POA: Diagnosis not present

## 2022-12-25 DIAGNOSIS — Z6832 Body mass index (BMI) 32.0-32.9, adult: Secondary | ICD-10-CM

## 2022-12-25 DIAGNOSIS — E6609 Other obesity due to excess calories: Secondary | ICD-10-CM

## 2022-12-25 DIAGNOSIS — E669 Obesity, unspecified: Secondary | ICD-10-CM

## 2022-12-25 LAB — BAYER DCA HB A1C WAIVED: HB A1C (BAYER DCA - WAIVED): 7.4 % — ABNORMAL HIGH (ref 4.8–5.6)

## 2022-12-25 MED ORDER — GABAPENTIN 600 MG PO TABS: 300.0000 mg | ORAL_TABLET | Freq: Every day | ORAL | 4 refills | Status: DC

## 2022-12-25 MED ORDER — OMEPRAZOLE 40 MG PO CPDR: 40.0000 mg | DELAYED_RELEASE_CAPSULE | Freq: Every day | ORAL | 4 refills | Status: DC

## 2022-12-25 MED ORDER — LISINOPRIL 5 MG PO TABS: 5.0000 mg | ORAL_TABLET | Freq: Every day | ORAL | 4 refills | Status: DC

## 2022-12-25 MED ORDER — MELOXICAM 15 MG PO TABS: 15.0000 mg | ORAL_TABLET | Freq: Every day | ORAL | 4 refills | Status: DC

## 2022-12-25 MED ORDER — ATORVASTATIN CALCIUM 20 MG PO TABS: 20.0000 mg | ORAL_TABLET | Freq: Every day | ORAL | 4 refills | Status: DC

## 2022-12-25 NOTE — Assessment & Plan Note (Signed)
Diagnosed in August 2022, A1c today trending down slightly at 7.4% (not at goal) and urine ALB 80 (April 2024).  Continue Metformin 500 MG BID (have recommended he take as ordered twice a day) and recommend he take Lisinopril as ordered for kidney protection.  Recommend he check BS at home a few mornings a week with goal <130.  - Would benefit from addition of SGLT2 or GLP1, however on attempting to order the costs of these out of pocket are $400 to $500, which would be too costly for patient.  Will place referral to clinical pharmacist for assist.  Can not go up on Metformin due to current side effects. - Statin remains on board + ACE, recommend he take consistently. - Eye and foot exams up to date - Vaccinations up to date

## 2022-12-25 NOTE — Assessment & Plan Note (Signed)
Chronic at baseline. Continue Gabapentin 300 MG QHS, educated him on this medication and use for pain. Use NSAID only as needed.   Recommend alternating heat/ice + placing Voltaren gel on back at home.  Continue to rest and avoid lifting until improved.  May benefit from neurosurgery in future.  New referral to PT placed.  Worker's Comp # [phone] = Amanda Pea (this was his past worker, unsure if current)

## 2022-12-25 NOTE — Assessment & Plan Note (Signed)
No current symptoms, check PSA on labs today.

## 2022-12-25 NOTE — Progress Notes (Signed)
BP 122/69   Pulse 70   Temp 97.9 F (36.6 C) (Oral)   Ht 5' 5.98" (1.676 m)   Wt 199 lb 6.4 oz (90.4 kg)   SpO2 95%   BMI 32.20 kg/m    Subjective:    Patient ID: Dale Morton, male    DOB: 11-13-1950, 72 y.o.   MRN: [id number]  HPI: Dale Morton is a 72 y.o. male  Chief Complaint  Patient presents with   Diabetes   Hyperlipidemia   Back Pain   Interpreter at bedside to assist with visit.  DIABETES Diagnosed in August 2022.  A1c April was 7.5%.  Continues Metformin 500 MG BID (he reports taking it but not every day -- leaves bitter taste in mouth) and Atorvastatin 20 MG daily for HLD (takes every day per his report).    Continues to take Omeprazole for GERD with benefit. Hypoglycemic episodes:no Polydipsia/polyuria: no Visual disturbance: no Chest pain: no Paresthesias: no Glucose Monitoring: no  Accucheck frequency: Not Checking  Fasting glucose:  Post prandial:  Evening:  Before meals: Taking Insulin?: no  Long acting insulin:  Short acting insulin: Blood Pressure Monitoring: not checking Retinal Examination: Up To Date -- [employer] Foot Exam: Up to Date Pneumovax: Up to Date Influenza: Up to Date Aspirin: no  The 10-year ASCVD risk score (Arnett DK, et al., 2019) is: 32.7%   Values used to calculate the score:     Age: 53 years     Sex: Male     Is Non-Hispanic African American: No     Diabetic: Yes     Tobacco smoker: No     Systolic Blood Pressure: 122 mmHg     Is BP treated: No     HDL Cholesterol: 42 mg/dL     Total Cholesterol: 212 mg/dL  BACK & SHOULDER PAIN  Chronic back pain, refer to history below. Did do physical therapy in [place] in past for right shoulder and received injection which helped.     Has been using Tizanidine and Meloxicam at home + is taking Gabapentin 300 MG at night.  When he is doing tasks he wears back support.  Was given "Super Flex" last time he was [place] -- he is no longer taking.  History:  Chronic at  baseline, since work accident in 15 or 1989.  During accident he was pulling a deck and it got broken, then pulled back during movement + has h/o falling from height during work.  Did go to physical therapy for period of time, which was beneficial -- however has not been able to get back into this due to workmen's comp not covering.  Last imaging was in October 2023 with degenerative changes to lumbar and thoracic spine.  Duration: chronic Mechanism of injury: work accident (chronic)  Location: low back and right shoulder Onset: chronic Severity: 8/10 right shoulder at worst and 2/10 at best + lower back is similar Quality: dull and burning across lower back Frequency: intermittent Radiation: none Aggravating factors: lifting and prolonged sitting Alleviating factors:  muscle relaxer Status: fluctuating Treatments attempted: Flexeril, Tizanidine, Meloxicam, Gabapentin Relief with NSAIDs?: moderate Nighttime pain:  no Paresthesias / decreased sensation:  no Bowel / bladder incontinence:  no Fevers:  no Dysuria / urinary frequency:  no  Relevant past medical, surgical, family and social history reviewed and updated as indicated. Interim medical history since our last visit reviewed. Allergies and medications reviewed and updated.  Review of Systems  Constitutional:  Negative for activity change, diaphoresis, fatigue and fever.  Respiratory:  Negative for cough, chest tightness, shortness of breath and wheezing.   Cardiovascular:  Negative for chest pain, palpitations and leg swelling.  Gastrointestinal: Negative.   Musculoskeletal:  Positive for arthralgias and back pain.  Skin: Negative.   Neurological: Negative.   Psychiatric/Behavioral: Negative.     Per HPI unless specifically indicated above    Objective:    BP 122/69   Pulse 70   Temp 97.9 F (36.6 C) (Oral)   Ht 5' 5.98" (1.676 m)   Wt 199 lb 6.4 oz (90.4 kg)   SpO2 95%   BMI 32.20 kg/m   Wt Readings from Last 3  Encounters:  12/25/22 199 lb 6.4 oz (90.4 kg)  11/12/22 197 lb 9.6 oz (89.6 kg)  09/25/22 192 lb 9.6 oz (87.4 kg)    Physical Exam Vitals and nursing note reviewed.  Constitutional:      General: He is not in acute distress.    Appearance: He is well-developed. He is not ill-appearing.  HENT:     Head: Normocephalic and atraumatic.     Right Ear: Hearing normal. No drainage.     Left Ear: Hearing normal. No drainage.     Mouth/Throat:     Pharynx: Uvula midline.  Eyes:     General: Lids are normal.        Right eye: No discharge.        Left eye: No discharge.     Conjunctiva/sclera: Conjunctivae normal.     Pupils: Pupils are equal, round, and reactive to light.  Neck:     Thyroid: No thyromegaly.     Vascular: No carotid bruit or JVD.     Trachea: Trachea normal.  Cardiovascular:     Rate and Rhythm: Normal rate and regular rhythm.     Heart sounds: Normal heart sounds, S1 normal and S2 normal. No murmur heard.    No gallop.  Pulmonary:     Effort: Pulmonary effort is normal. No accessory muscle usage or respiratory distress.     Breath sounds: Normal breath sounds.  Abdominal:     General: Bowel sounds are normal.     Palpations: Abdomen is soft. There is no hepatomegaly or splenomegaly.  Musculoskeletal:     Right shoulder: Tenderness present. No swelling, laceration, bony tenderness or crepitus. Normal range of motion. Normal strength.     Left shoulder: Normal.     Cervical back: Normal range of motion and neck supple. No erythema or rigidity. No spinous process tenderness or muscular tenderness. Normal range of motion.     Lumbar back: Tenderness present. No swelling, edema or lacerations. Decreased range of motion.     Right lower leg: No edema.     Left lower leg: No edema.     Comments: Decreased flexion/extension/lateral ROM.  Negative straight leg.  Skin:    General: Skin is warm and dry.     Capillary Refill: Capillary refill takes less than 2 seconds.   Neurological:     Mental Status: He is alert and oriented to person, place, and time.     Deep Tendon Reflexes: Reflexes are normal and symmetric.  Psychiatric:        Mood and Affect: Mood normal.        Behavior: Behavior normal.        Thought Content: Thought content normal.        Judgment: Judgment normal.    Results for  orders placed or performed in visit on 12/25/22  Bayer DCA Hb A1c Waived  Result Value Ref Range   HB A1C (BAYER DCA - WAIVED) 7.4 (H) 4.8 - 5.6 %      Assessment & Plan:   Problem List Items Addressed This Visit       Digestive   Gastroesophageal reflux disease without esophagitis    Improved at this time, continue Omeprazole as needed and adjust if needed in future.  Recommend heavy focus on diet changes at home.  Mag level annually.  Risks of PPI use were discussed with patient including bone loss, C. Diff diarrhea, pneumonia, infections, CKD, electrolyte abnormalities.  Verbalizes understanding and chooses to continue the medication.       Relevant Medications   omeprazole (PRILOSEC) 40 MG capsule   Other Relevant Orders   Comp. Metabolic Panel (12)   Lipid panel   Magnesium   CBC with Differential/Platelet   PSA   TSH     Endocrine   Hyperlipidemia associated with type 2 diabetes mellitus (HCC)   Relevant Medications   atorvastatin (LIPITOR) 20 MG tablet   lisinopril (ZESTRIL) 5 MG tablet   Other Relevant Orders   Comp. Metabolic Panel (12)   Lipid panel   Magnesium   CBC with Differential/Platelet   PSA   TSH   Amb Referral to Clinical Pharmacist   Type 2 diabetes mellitus with obesity (HCC) - Primary    Diagnosed in August 2022, A1c today trending down slightly at 7.4% (not at goal) and urine ALB 80 (April 2024).  Continue Metformin 500 MG BID (have recommended he take as ordered twice a day) and recommend he take Lisinopril as ordered for kidney protection.  Recommend he check BS at home a few mornings a week with goal <130.  -  Would benefit from addition of SGLT2 or GLP1, however on attempting to order the costs of these out of pocket are $400 to $500, which would be too costly for patient.  Will place referral to clinical pharmacist for assist.  Can not go up on Metformin due to current side effects. - Statin remains on board + ACE, recommend he take consistently. - Eye and foot exams up to date - Vaccinations up to date      Relevant Medications   atorvastatin (LIPITOR) 20 MG tablet   lisinopril (ZESTRIL) 5 MG tablet   Other Relevant Orders   Bayer DCA Hb A1c Waived (Completed)   Comp. Metabolic Panel (12)   Lipid panel   Magnesium   CBC with Differential/Platelet   PSA   TSH   Amb Referral to Clinical Pharmacist   Type 2 diabetes mellitus with proteinuria (HCC)    Diagnosed in August 2022, A1c today trending down slightly at 7.4% (not at goal) and urine ALB 80 (April 2024).  Continue Metformin 500 MG BID (have recommended he take as ordered twice a day) and recommend he take Lisinopril as ordered for kidney protection.  Recommend he check BS at home a few mornings a week with goal <130.  - Would benefit from addition of SGLT2 or GLP1, however on attempting to order the costs of these out of pocket are $400 to $500, which would be too costly for patient.  Will place referral to clinical pharmacist for assist.  Can not go up on Metformin due to current side effects. - Statin remains on board + ACE, recommend he take consistently. - Eye and foot exams up to date - Vaccinations  up to date      Relevant Medications   atorvastatin (LIPITOR) 20 MG tablet   lisinopril (ZESTRIL) 5 MG tablet     Genitourinary   Benign prostatic hyperplasia without lower urinary tract symptoms    No current symptoms, check PSA on labs today.      Relevant Orders   Comp. Metabolic Panel (12)   Lipid panel   Magnesium   CBC with Differential/Platelet   PSA   TSH     Other   Low back pain    Chronic at baseline. Continue  Gabapentin 300 MG QHS, educated him on this medication and use for pain. Use NSAID only as needed.   Recommend alternating heat/ice + placing Voltaren gel on back at home.  Continue to rest and avoid lifting until improved.  May benefit from neurosurgery in future.  New referral to PT placed.  Worker's Comp # [phone] = Amanda Pea (this was his past worker, unsure if current)      Relevant Medications   meloxicam (MOBIC) 15 MG tablet   Other Relevant Orders   Ambulatory referral to Physical Therapy   Comp. Metabolic Panel (12)   Lipid panel   Magnesium   CBC with Differential/Platelet   PSA   TSH   Obesity    BMI 32.20.  Recommended eating smaller high protein, low fat meals more frequently and exercising 30 mins a day 5 times a week with a goal of 10-15lb weight loss in the next 3 months. Patient voiced their understanding and motivation to adhere to these recommendations.       Relevant Orders   Comp. Metabolic Panel (12)   Lipid panel   Magnesium   CBC with Differential/Platelet   PSA   TSH   Other Visit Diagnoses     Need for Td vaccine       Td vaccine provided today and educated on this.   Relevant Orders   Td vaccine greater than or equal to 7yo preservative free IM (Completed)   Comp. Metabolic Panel (12)   Lipid panel   Magnesium   CBC with Differential/Platelet   PSA   TSH        Follow up plan: Return in about 3 months (around 03/27/2023) for T2DM, HTN/HLD.

## 2022-12-25 NOTE — Assessment & Plan Note (Signed)
BMI 32.20.  Recommended eating smaller high protein, low fat meals more frequently and exercising 30 mins a day 5 times a week with a goal of 10-15lb weight loss in the next 3 months. Patient voiced their understanding and motivation to adhere to these recommendations.  

## 2022-12-25 NOTE — Assessment & Plan Note (Signed)
Improved at this time, continue Omeprazole as needed and adjust if needed in future.  Recommend heavy focus on diet changes at home.  Mag level annually.  Risks of PPI use were discussed with patient including bone loss, C. Diff diarrhea, pneumonia, infections, CKD, electrolyte abnormalities.  Verbalizes understanding and chooses to continue the medication.

## 2022-12-26 LAB — CBC WITH DIFFERENTIAL/PLATELET

## 2022-12-26 LAB — LIPID PANEL W/O CHOL/HDL RATIO
Cholesterol, Total: 176 mg/dL (ref 100–199)
Triglycerides: 207 mg/dL — ABNORMAL HIGH (ref 0–149)

## 2022-12-26 LAB — COMPREHENSIVE METABOLIC PANEL
Albumin: 4.8 g/dL (ref 3.9–4.9)
BUN: 13 mg/dL (ref 8–27)
Creatinine, Ser: 0.76 mg/dL (ref 0.76–1.27)
Globulin, Total: 3 g/dL (ref 1.5–4.5)
eGFR: 97 mL/min/{1.73_m2} (ref >59)

## 2022-12-26 LAB — PSA: Prostate Specific Ag, Serum: 2.3 ng/mL (ref 0.0–4.0)

## 2022-12-26 LAB — MAGNESIUM: Magnesium: 2 mg/dL (ref 1.6–2.3)

## 2022-12-27 LAB — COMPREHENSIVE METABOLIC PANEL
ALT: 25 IU/L (ref 0–44)
AST: 23 IU/L (ref 0–40)
Alkaline Phosphatase: 149 IU/L — ABNORMAL HIGH (ref 44–121)
BUN/Creatinine Ratio: 17 (ref 10–24)
Bilirubin Total: 0.6 mg/dL (ref 0.0–1.2)
CO2: 24 mmol/L (ref 20–29)
Calcium: 9.5 mg/dL (ref 8.6–10.2)
Chloride: 100 mmol/L (ref 96–106)
Glucose: 123 mg/dL — ABNORMAL HIGH (ref 70–99)
Potassium: 4.3 mmol/L (ref 3.5–5.2)
Sodium: 137 mmol/L (ref 134–144)
Total Protein: 7.8 g/dL (ref 6.0–8.5)

## 2022-12-27 LAB — CBC WITH DIFFERENTIAL/PLATELET
Basophils Absolute: 0.1 10*3/uL (ref 0.0–0.2)
EOS (ABSOLUTE): 1.5 10*3/uL — ABNORMAL HIGH (ref 0.0–0.4)
Eos: 23 %
Lymphocytes Absolute: 2 10*3/uL (ref 0.7–3.1)
MCH: 30.1 pg (ref 26.6–33.0)
MCHC: 33.6 g/dL (ref 31.5–35.7)
Platelets: 217 10*3/uL (ref 150–450)
RDW: 12.5 % (ref 11.6–15.4)
WBC: 6.4 10*3/uL (ref 3.4–10.8)

## 2022-12-27 LAB — LIPID PANEL W/O CHOL/HDL RATIO
HDL: 37 mg/dL — ABNORMAL LOW (ref >39)
LDL Chol Calc (NIH): 103 mg/dL — ABNORMAL HIGH (ref 0–99)
VLDL Cholesterol Cal: 36 mg/dL (ref 5–40)

## 2022-12-27 LAB — TSH: TSH: 2.69 u[IU]/mL (ref 0.450–4.500)

## 2022-12-27 NOTE — Progress Notes (Signed)
Contacted via MyChart -- however if we can get a Spanish interpreter to call and ensure he received message below and has no questions, thank you:    Good morning Dale Morton, your labs have returned: - Kidney and liver function are normal - Cholesterol levels show LDL, bad cholesterol, above goal which can put you at risk for stroke or heart attack -- please ensure you are taking you Atorvastatin daily with no missed doses. - Prostate blood work we do annually is normal, as is magnesium level and thyroid testing. - CBC for blood counts is pending and if abnormal I will call and let you know.  Any questions? Keep being amazing!!  Thank you for allowing me to participate in your care.  I appreciate you. Kindest regards, Coby Antrobus

## 2023-01-15 ENCOUNTER — Telehealth: Payer: Self-pay

## 2023-01-15 NOTE — Progress Notes (Signed)
   Care Guide Note  01/15/2023 Name: Dale Morton MRN: [id number] DOB: 1950-09-01  Referred by: [name], NP Reason for referral : Care Management (Outreach to schedule with Pharm d )   Dale Morton is a 72 y.o. year old male who is a primary care patient of [name], Dorie Rank, NP. Dale Morton was referred to the pharmacist for assistance related to DM.    Successful contact was made with the patient to discuss pharmacy services including being ready for the pharmacist to call at least 5 minutes before the scheduled appointment time, to have medication bottles and any blood sugar or blood pressure readings ready for review. The patient agreed to meet with the pharmacist via with the pharmacist via telephone visit on (date/time).  02/01/2023  Penne Lash, RMA Care Guide [facility]  [place], [place] [address] Direct Dial: [phone] [email or web address]

## 2023-01-27 DIAGNOSIS — H40013 Open angle with borderline findings, low risk, bilateral: Secondary | ICD-10-CM | POA: Diagnosis not present

## 2023-02-01 ENCOUNTER — Other Ambulatory Visit: Payer: Medicare Other

## 2023-02-01 NOTE — Progress Notes (Signed)
   02/01/2023  Patient ID: Dale Morton, male   DOB: 12/09/1950, 72 y.o.   MRN: [id number]  S/O Telephone visit along with [facility] (Spanish) to discuss diabetes management and medication access/adherence  Medication Access/Adherence -Patient's recent A1c was elevated at 7.4 on 7/1; and PCP, Jolene [name], would like to initiate additional therapy with SGLT2i or GLP1 -Concern with affordability on patient's Medicare D plan -Reviewed medication list with patient, and it does not appear he has been taking metformin- he does not have it at home, and pharmacy states last filled October 2023 -Patient is also not checking home BG; he has testing supplies at home, but does not feel like he needs to check this regularly  A/P  Medication Access/Adherence -[facility]illing [facility]; suggested patient restart therapy of 500mg  BID -Discussed SGLT2 versus GLP1, and patient would prefer daily oral tablet.  No contraindications to therapy were identified, and he would qualify for AZ&Me PAP for Marcelline Deist -He is very hesitant to start therapy because he is not symptomatic, so I explained that his A1c (which gives a 3 month average of blood sugar control) was elevated in July.  Also informed this medication provides heart and kidney protection.  He is agreeable to start therapy. -I recommend Farxiga 5mg  daily, increasing to 10mg  daily if needed -If PCP agrees, I will coordinate with medication assistance team to initiate application process on his behalf  Follow-up:  Will monitor progress of PAP and keep patient/provider informed  Lenna Gilford, PharmD, DPLA

## 2023-02-01 NOTE — Progress Notes (Signed)
   02/01/2023  Patient ID: Dale Morton, male   DOB: 01-14-1951, 71 y.o.   MRN: [id number]  PCP agrees with plan to start Farxiga 5mg  daily and pursue PAP through AZ&Me.  Coordinating with medication assistance team to initiate the application process.  Lenna Gilford, PharmD, DPLA

## 2023-02-03 ENCOUNTER — Telehealth: Payer: Self-pay

## 2023-02-03 NOTE — Telephone Encounter (Signed)
PAP: PAP application for Farxiga, Publishing rights manager (AZ&Me)) has been mailed to pt's home address on file. Will fax provider portion of application to provider's office when pt's portion is received.    PLEASE BE ADVISED

## 2023-02-03 NOTE — Telephone Encounter (Signed)
-----   Message from Lenna Gilford sent at 02/01/2023  5:06 PM EDT ----- Peri Jefferson afternoon!  Provider would like to start patient on Farxiga 5mg  daily; and he would qualify for AZ&Me PAP.  DO you all mind to start the application process for him?  Thank you!

## 2023-02-23 NOTE — Telephone Encounter (Signed)
RECEIVED PT PAGES FOR PAP FARXIGA(AZ&ME) AND FAXED PROVIDER PAGE TO OFFICE OF JOLENE [name]   PLEASE BE ADVISED

## 2023-02-26 NOTE — Telephone Encounter (Signed)
PAP: Application for Marcelline Deist has been submitted to PAP Companies: [employer], via fax    PLEASE BE ADVISED

## 2023-03-03 NOTE — Telephone Encounter (Signed)
PAP: Patient assistance application for Marcelline Deist has been approved by PAP Companies: [employer] from 02/26/2023 to 06/07/2024. Medication should be delivered to PAP Delivery: Home For further shipping updates, please contact [employer] ([employer]) at [phone] Pt ID is: no id   Please be advised   Georga [facility] Patient Advocate [phone]

## 2023-03-05 DIAGNOSIS — H40013 Open angle with borderline findings, low risk, bilateral: Secondary | ICD-10-CM | POA: Diagnosis not present

## 2023-03-09 ENCOUNTER — Telehealth: Payer: Self-pay

## 2023-03-09 NOTE — Progress Notes (Unsigned)
03/09/2023  Patient ID: Dale Morton, male   DOB: 05/02/1951, 72 y.o.   MRN: [id number]  S/O Patient outreach to check in on control of diabetes  Diabetes -Current medications:  metformin 500mg  daily  -Patient states he is taking metformin once daily versus twice daily due to stomach upset -He has been approved for Farxiga PAP but has not yet received medication -He does not check home BG -Last A1c 7.4 in July  A/P  Diabetes -Patient sees PCP again 10/21 and is due for A1c -If A1c >7.5%, I recommend changing metformin to XR 500mg  BID, as XR can have less GI side effects -If A1c<7.5%, likely to be <7% at next reading once Farxiga 10mg  on board  Follow-up:  1 week to see if Marcelline Deist has been received  Lenna Gilford, PharmD, DPLA

## 2023-03-16 ENCOUNTER — Other Ambulatory Visit: Payer: Medicare Other

## 2023-03-16 NOTE — Progress Notes (Signed)
03/16/2023  Patient ID: Dale Morton, male   DOB: 10-09-1950, 72 y.o.   MRN: [id number]  S/O Patient outreach to verify Farxiga 5mg  has been received from [employer] PAP  Diabetes Management Plan -Current medications:  Farxiga 5mg  daily, Metformin 500mg  daily -Farxiga 5mg  has been received, and the patient has been taking for a few days now.  Endorses that he is tolerating well so far. -He continues to only take metformin 500mg  once daily versus twice daily as prescribed.  He previously stated this was due to stomach upset. -Patient continues not to check home BG because he does not experience symptoms of high/low BG -A1c was 7.4% on 7/19  A/P  Diabetes Management Plan -Patient sees PCP again 10/21 and will be due for A1c, but this will not reflect glucose control with Marcelline Deist on board -Recommend increasing Farxiga to 10mg  after 4 weeks of therapy if he continues to tolerate well if A1c still >7% at this visit  Follow-up:  None scheduled but can as recommended by PCP  Lenna Gilford, PharmD, DPLA

## 2023-03-27 NOTE — Patient Instructions (Signed)
Be Involved in Caring For Your Health:  Taking Medications When medications are taken as directed, they can greatly improve your health. But if they are not taken as prescribed, they may not work. In some cases, not taking them correctly can be harmful. To help ensure your treatment remains effective and safe, understand your medications and how to take them. Bring your medications to each visit for review by your provider.  Your lab results, notes, and after visit summary will be available on My Chart. We strongly encourage you to use this feature. If lab results are abnormal the clinic will contact you with the appropriate steps. If the clinic does not contact you assume the results are satisfactory. You can always view your results on My Chart. If you have questions regarding your health or results, please contact the clinic during office hours. You can also ask questions on My Chart.  We at [facility] are grateful that you chose [place] to provide your care. We strive to provide evidence-based and compassionate care and are always looking for feedback. If you get a survey from the clinic please complete this so we can hear your opinions.

## 2023-03-29 ENCOUNTER — Telehealth: Payer: Self-pay | Admitting: Nurse Practitioner

## 2023-03-29 ENCOUNTER — Encounter: Payer: Self-pay | Admitting: Nurse Practitioner

## 2023-03-29 ENCOUNTER — Ambulatory Visit: Payer: Medicare Other | Admitting: Nurse Practitioner

## 2023-03-29 VITALS — BP 104/65 | HR 67 | Temp 98.1°F | Ht 66.0 in | Wt 197.0 lb

## 2023-03-29 DIAGNOSIS — M545 Low back pain, unspecified: Secondary | ICD-10-CM | POA: Diagnosis not present

## 2023-03-29 DIAGNOSIS — E1129 Type 2 diabetes mellitus with other diabetic kidney complication: Secondary | ICD-10-CM

## 2023-03-29 DIAGNOSIS — E785 Hyperlipidemia, unspecified: Secondary | ICD-10-CM | POA: Diagnosis not present

## 2023-03-29 DIAGNOSIS — Z23 Encounter for immunization: Secondary | ICD-10-CM | POA: Diagnosis not present

## 2023-03-29 DIAGNOSIS — G8929 Other chronic pain: Secondary | ICD-10-CM

## 2023-03-29 DIAGNOSIS — Z0000 Encounter for general adult medical examination without abnormal findings: Secondary | ICD-10-CM | POA: Diagnosis not present

## 2023-03-29 DIAGNOSIS — R809 Proteinuria, unspecified: Secondary | ICD-10-CM | POA: Diagnosis not present

## 2023-03-29 DIAGNOSIS — E66811 Obesity, class 1: Secondary | ICD-10-CM

## 2023-03-29 DIAGNOSIS — E1169 Type 2 diabetes mellitus with other specified complication: Secondary | ICD-10-CM

## 2023-03-29 DIAGNOSIS — E669 Obesity, unspecified: Secondary | ICD-10-CM

## 2023-03-29 NOTE — Telephone Encounter (Signed)
Copied from CRM [id number]. Topic: General - Inquiry >> Mar 29, 2023  1:04 PM Payton Doughty wrote: Reason for CRM: Reason for CRM: Marisue Ivan w/ [facility] would like to know if you have the workers comp information?  Pt told her that his therapy should be under Workers Comp. If so, please fax   [phone]

## 2023-03-29 NOTE — Telephone Encounter (Signed)
Workers comp information was sent to fax#

## 2023-03-29 NOTE — Assessment & Plan Note (Signed)
Diagnosed in August 2022, A1c last visit 7.4% (not at goal) and urine ALB 80 (April 2024). Recheck A1c today. Continue Farxiga daily, but discussed with him we may need to restart Metformin if A1c not at goal -- he is not taking it.  Recommend he check BS at home a few mornings a week with goal <130.  - Continue to work with [facility] - Statin remains on board + ACE, recommend he take consistently. - Eye and foot exams up to date - Vaccinations up to date

## 2023-03-29 NOTE — Assessment & Plan Note (Signed)
Chronic, ongoing.  Continue current medication regimen and adjust as needed. Lipid panel today. 

## 2023-03-29 NOTE — Progress Notes (Signed)
BP 104/65   Pulse 67   Temp 98.1 F (36.7 C) (Oral)   Ht 5\' 6"  (1.676 m)   Wt 197 lb (89.4 kg)   SpO2 96%   BMI 31.80 kg/m    Subjective:    Patient ID: Dale Morton, male    DOB: 1950-08-08, 72 y.o.   MRN: [id number]  HPI: Dale Morton is a 72 y.o. male presenting on 03/29/2023 for Medicare Wellness and follow-up visit. Current medical complaints include: none  He currently lives with: wife Interim Problems from his last visit: no  DIABETES Diagnosed in August 2022.  A1c 7.4% in July and is working with [facility] Pharm[facility].  Taking Metformin 500 MG BID (not currently taking) + Farxiga 5 MG daily (is taking daily).  Atorvastatin 20 MG daily for HLD daily. Takes Lisinopril 5 MG daily. Hypoglycemic episodes:no Polydipsia/polyuria: no Visual disturbance: no Chest pain: no Paresthesias: no Glucose Monitoring: no  Accucheck frequency: Not Checking  Fasting glucose:  Post prandial:  Evening:  Before meals: Taking Insulin?: no  Long acting insulin:  Short acting insulin: Blood Pressure Monitoring: not checking Retinal Examination: Up To Date -- [employer] Foot Exam: Up to Date Pneumovax: Up to Date Influenza: Up to Date Aspirin: no  The 10-year ASCVD risk score (Arnett DK, et al., 2019) is: 26.5%   Values used to calculate the score:     Age: 71 years     Sex: Male     Is Non-Hispanic African American: No     Diabetic: Yes     Tobacco smoker: No     Systolic Blood Pressure: 104 mmHg     Is BP treated: No     HDL Cholesterol: 37 mg/dL     Total Cholesterol: 176 mg/dL   BACK & SHOULDER PAIN  Has been using Meloxicam at home, not taking Tizanidine or Gabapentin. When he is doing tasks he wears back support.  At times gets medication [place] Can get physical therapy in [place] for right shoulder and injections.  History: Chronic at baseline, since work accident in 1 or 1989.  During accident he was pulling a deck and it got broken, then pulled back during  movement + has h/o falling from height during work.  Did go to physical therapy for period of time, which was beneficial -- however has not been able to get back into this due to workmen's comp not covering.  Last imaging was in October 2023 with degenerative changes to lumbar and thoracic spine.   Duration: months Mechanism of injury: work accident (chronic)  Location: low back and right shoulder Onset: chronic Severity: 9/10 at worst (when sitting on couch) and at best 2/10  Quality: dull and burning across lower back, dull/aching shoulder Frequency: intermittent Radiation: none Aggravating factors: lifting and prolonged sitting Alleviating factors:  Meloxicam Status: fluctuating Treatments attempted: Flexeril, Tizanidine, Meloxicam, Gabapentin Relief with NSAIDs?: moderate Nighttime pain:  no Paresthesias / decreased sensation:  no Bowel / bladder incontinence:  no Fevers:  no Dysuria / urinary frequency:  no  Functional Status Survey: Is the patient deaf or have difficulty hearing?: No Does the patient have difficulty seeing, even when wearing glasses/contacts?: No Does the patient have difficulty concentrating, remembering, or making decisions?: No Does the patient have difficulty walking or climbing stairs?: No Does the patient have difficulty dressing or bathing?: No Does the patient have difficulty doing errands alone such as visiting a doctor's office or shopping?: No  FALL  RISK:    03/29/2023    8:54 AM 11/12/2022    9:13 AM 02/17/2022   11:23 AM 02/02/2022    1:56 PM 01/31/2021    8:19 AM  Fall Risk   Falls in the past year? 0 0 0 0 0  Number falls in past yr: 0 0 0 0 0  Injury with Fall? 0 0 0 0 0  Risk for fall due to : No Fall Risks No Fall Risks  No Fall Risks No Fall Risks  Follow up Falls evaluation completed Falls evaluation completed Falls evaluation completed;Education provided;Falls prevention discussed Falls evaluation completed Education provided    Depression Screen    03/29/2023    9:41 AM 03/29/2023    9:40 AM 02/17/2022   11:29 AM 02/02/2022    1:57 PM 01/31/2021    8:20 AM  Depression screen PHQ 2/9  Decreased Interest 0 0 0 0 0  Down, Depressed, Hopeless 0 0 0 0 0  PHQ - 2 Score 0 0 0 0 0  Altered sleeping 0  0 0 0  Tired, decreased energy 0  0 0 0  Change in appetite 0  0 0 0  Feeling bad or failure about yourself  0  0 0 0  Trouble concentrating 0  0 0 0  Moving slowly or fidgety/restless 0  0 0 0  Suicidal thoughts 0  0 0 0  PHQ-9 Score 0  0 0 0  Difficult doing work/chores   Not difficult at all Not difficult at all Not difficult at all      03/29/2023    9:41 AM 02/02/2022    1:58 PM  GAD 7 : Generalized Anxiety Score  Nervous, Anxious, on Edge 0 0  Control/stop worrying 0 0  Worry too much - different things 0 0  Trouble relaxing 0 0  Restless 0 0  Easily annoyed or irritable 0 0  Afraid - awful might happen 0 0  Total GAD 7 Score 0 0  Anxiety Difficulty Not difficult at all Not difficult at all   Advanced Directives Does patient have a HCPOA?    no If yes, name and contact information:  Does patient have a living will or MOST form?  no  Past Medical History:  Past Medical History:  Diagnosis Date   Encounter for long-term (current) use of other high-risk medications    GERD (gastroesophageal reflux disease)    Hyperlipidemia    Lumbago     Surgical History:  Past Surgical History:  Procedure Laterality Date   APPENDECTOMY      Medications:  Current Outpatient Medications on File Prior to Visit  Medication Sig   atorvastatin (LIPITOR) 20 MG tablet Take 1 tablet (20 mg total) by mouth daily.   dapagliflozin propanediol (FARXIGA) 5 MG TABS tablet Take 5 mg by mouth daily.   lisinopril (ZESTRIL) 5 MG tablet Take 1 tablet (5 mg total) by mouth daily.   meloxicam (MOBIC) 15 MG tablet Take 1 tablet (15 mg total) by mouth daily.   metFORMIN (GLUCOPHAGE) 500 MG tablet Take 1 tablet (500 mg  total) by mouth 2 (two) times daily with a meal.   omeprazole (PRILOSEC) 40 MG capsule Take 1 capsule (40 mg total) by mouth daily.   Blood Glucose Monitoring Suppl (ONETOUCH VERIO) w/Device KIT Use to check blood sugar 3 times a day and document results, bring to appointments.  Goal is <130 fasting blood sugar and <180 two hours after meals. (Patient not  taking: Reported on 02/01/2023)   glucose blood test strip Use to check blood sugar 3 times daily, fasting in morning with goal <130 and 2 hours after meals with goal <180.  Bring blood sugar log to visits. (Patient not taking: Reported on 02/01/2023)   OneTouch Delica Lancets 33G MISC To check blood sugar prior to eating in morning with goal of less then 130.  Document for visits. (Patient not taking: Reported on 02/01/2023)   No current facility-administered medications on file prior to visit.    Allergies:  No Known Allergies  Social History:  Social History   Socioeconomic History   Marital status: Married    Spouse name: Not on file   Number of children: Not on file   Years of education: Not on file   Highest education level: Not on file  Occupational History   Not on file  Tobacco Use   Smoking status: Former    Current packs/day: 0.00    Types: Cigarettes    Quit date: 05/30/1985    Years since quitting: 37.8   Smokeless tobacco: Never  Vaping Use   Vaping status: Never Used  Substance and Sexual Activity   Alcohol use: No    Alcohol/week: 0.0 standard drinks of alcohol   Drug use: No   Sexual activity: Yes  Other Topics Concern   Not on file  Social History Narrative   Not on file   Social Determinants of Health   Financial Resource Strain: Low Risk  (03/29/2023)   Overall Financial Resource Strain (CARDIA)    Difficulty of Paying Living Expenses: Not hard at all  Food Insecurity: No Food Insecurity (03/29/2023)   Hunger Vital Sign    Worried About Running Out of Food in the Last Year: Never true    Ran Out of  Food in the Last Year: Never true  Transportation Needs: No Transportation Needs (03/29/2023)   PRAPARE - Administrator, Civil Service (Medical): No    Lack of Transportation (Non-Medical): No  Physical Activity: Insufficiently Active (03/29/2023)   Exercise Vital Sign    Days of Exercise per Week: 3 days    Minutes of Exercise per Session: 30 min  Stress: No Stress Concern Present (03/29/2023)   [employer] of Occupational Health - Occupational Stress Questionnaire    Feeling of Stress : Not at all  Social Connections: Moderately Integrated (03/29/2023)   Social Connection and Isolation Panel [NHANES]    Frequency of Communication with Friends and Family: More than three times a week    Frequency of Social Gatherings with Friends and Family: More than three times a week    Attends Religious Services: More than 4 times per year    Active Member of [employer] or Organizations: No    Attends Banker Meetings: Never    Marital Status: Married  Catering manager Violence: Not At Risk (03/29/2023)   Humiliation, Afraid, Rape, and Kick questionnaire    Fear of Current or Ex-Partner: No    Emotionally Abused: No    Physically Abused: No    Sexually Abused: No   Social History   Tobacco Use  Smoking Status Former   Current packs/day: 0.00   Types: Cigarettes   Quit date: 05/30/1985   Years since quitting: 37.8  Smokeless Tobacco Never   Social History   Substance and Sexual Activity  Alcohol Use No   Alcohol/week: 0.0 standard drinks of alcohol    Family History:  Family History  Problem Relation Age of Onset   Hypertension Father    Cancer Sister        breast    Past medical history, surgical history, medications, allergies, family history and social history reviewed with patient today and changes made to appropriate areas of the chart.   Review of Systems  Constitutional:  Negative for chills, diaphoresis, fever and weight loss.   Respiratory:  Negative for cough, shortness of breath and wheezing.   Cardiovascular:  Negative for chest pain, palpitations, orthopnea and leg swelling.  Gastrointestinal: Negative.   Musculoskeletal:  Positive for back pain.  Neurological: Negative.   Endo/Heme/Allergies: Negative.   Psychiatric/Behavioral: Negative.     All other ROS negative except what is listed above and in the HPI.      Objective:    BP 104/65   Pulse 67   Temp 98.1 F (36.7 C) (Oral)   Ht 5\' 6"  (1.676 m)   Wt 197 lb (89.4 kg)   SpO2 96%   BMI 31.80 kg/m   Wt Readings from Last 3 Encounters:  03/29/23 197 lb (89.4 kg)  12/25/22 199 lb 6.4 oz (90.4 kg)  11/12/22 197 lb 9.6 oz (89.6 kg)    No results found.  Physical Exam Vitals and nursing note reviewed.  Constitutional:      General: He is not in acute distress.    Appearance: He is well-developed. He is not ill-appearing.  HENT:     Head: Normocephalic and atraumatic.     Right Ear: Hearing normal. No drainage.     Left Ear: Hearing normal. No drainage.     Mouth/Throat:     Pharynx: Uvula midline.  Eyes:     General: Lids are normal.        Right eye: No discharge.        Left eye: No discharge.     Conjunctiva/sclera: Conjunctivae normal.     Pupils: Pupils are equal, round, and reactive to light.  Neck:     Thyroid: No thyromegaly.     Vascular: No carotid bruit or JVD.     Trachea: Trachea normal.  Cardiovascular:     Rate and Rhythm: Normal rate and regular rhythm.     Heart sounds: Normal heart sounds, S1 normal and S2 normal. No murmur heard.    No gallop.  Pulmonary:     Effort: Pulmonary effort is normal. No accessory muscle usage or respiratory distress.     Breath sounds: Normal breath sounds.  Abdominal:     General: Bowel sounds are normal.     Palpations: Abdomen is soft. There is no hepatomegaly or splenomegaly.  Musculoskeletal:     Right shoulder: Normal.     Left shoulder: Normal.     Cervical back: Normal  range of motion and neck supple. No erythema or rigidity. No spinous process tenderness or muscular tenderness. Normal range of motion.     Lumbar back: Tenderness present. No swelling, edema or lacerations. Decreased range of motion.     Right lower leg: No edema.     Left lower leg: No edema.  Skin:    General: Skin is warm and dry.     Capillary Refill: Capillary refill takes less than 2 seconds.  Neurological:     Mental Status: He is alert and oriented to person, place, and time.     Deep Tendon Reflexes: Reflexes are normal and symmetric.  Psychiatric:        Mood and Affect: Mood  normal.        Behavior: Behavior normal.        Thought Content: Thought content normal.        Judgment: Judgment normal.       03/29/2023    9:41 AM 02/17/2022   11:25 AM  6CIT Screen  What Year? 0 points 0 points  What month? 0 points 0 points  What time? 0 points 0 points  Count back from 20 0 points 0 points  Months in reverse 0 points 4 points  Repeat phrase 2 points 4 points  Total Score 2 points 8 points   Results for orders placed or performed in visit on 12/25/22  CBC with Differential/Platelet  Result Value Ref Range   WBC 6.4 3.4 - 10.8 x10E3/uL   RBC 4.91 4.14 - 5.80 x10E6/uL   Hemoglobin 14.8 13.0 - 17.7 g/dL   Hematocrit 40.9 81.1 - 51.0 %   MCV 90 79 - 97 fL   MCH 30.1 26.6 - 33.0 pg   MCHC 33.6 31.5 - 35.7 g/dL   RDW 91.4 78.2 - 95.6 %   Platelets 217 150 - 450 x10E3/uL   Neutrophils 37 Not Estab. %   Lymphs 31 Not Estab. %   Monocytes 7 Not Estab. %   Eos 23 Not Estab. %   Basos 2 Not Estab. %   Neutrophils Absolute 2.4 1.4 - 7.0 x10E3/uL   Lymphocytes Absolute 2.0 0.7 - 3.1 x10E3/uL   Monocytes Absolute 0.5 0.1 - 0.9 x10E3/uL   EOS (ABSOLUTE) 1.5 (H) 0.0 - 0.4 x10E3/uL   Basophils Absolute 0.1 0.0 - 0.2 x10E3/uL   Immature Granulocytes 0 Not Estab. %   Immature Grans (Abs) 0.0 0.0 - 0.1 x10E3/uL   Hematology Comments: Note:   Comprehensive metabolic panel   Result Value Ref Range   Glucose 123 (H) 70 - 99 mg/dL   BUN 13 8 - 27 mg/dL   Creatinine, Ser 2.13 0.76 - 1.27 mg/dL   eGFR 97 >08 MV/HQI/6.96   BUN/Creatinine Ratio 17 10 - 24   Sodium 137 134 - 144 mmol/L   Potassium 4.3 3.5 - 5.2 mmol/L   Chloride 100 96 - 106 mmol/L   CO2 24 20 - 29 mmol/L   Calcium 9.5 8.6 - 10.2 mg/dL   Total Protein 7.8 6.0 - 8.5 g/dL   Albumin 4.8 3.9 - 4.9 g/dL   Globulin, Total 3.0 1.5 - 4.5 g/dL   Bilirubin Total 0.6 0.0 - 1.2 mg/dL   Alkaline Phosphatase 149 (H) 44 - 121 IU/L   AST 23 0 - 40 IU/L   ALT 25 0 - 44 IU/L  Lipid Panel w/o Chol/HDL Ratio  Result Value Ref Range   Cholesterol, Total 176 100 - 199 mg/dL   Triglycerides 295 (H) 0 - 149 mg/dL   HDL 37 (L) >28 mg/dL   VLDL Cholesterol Cal 36 5 - 40 mg/dL   LDL Chol Calc (NIH) 413 (H) 0 - 99 mg/dL  PSA  Result Value Ref Range   Prostate Specific Ag, Serum 2.3 0.0 - 4.0 ng/mL  TSH  Result Value Ref Range   TSH 2.690 0.450 - 4.500 uIU/mL  Magnesium  Result Value Ref Range   Magnesium 2.0 1.6 - 2.3 mg/dL      Assessment & Plan:   Problem List Items Addressed This Visit       Endocrine   Hyperlipidemia associated with type 2 diabetes mellitus (HCC)    Chronic, ongoing.  Continue  current medication regimen and adjust as needed. Lipid panel today.       Relevant Orders   Comprehensive metabolic panel   Lipid Panel w/o Chol/HDL Ratio   Type 2 diabetes mellitus with obesity (HCC)    Diagnosed in August 2022, A1c last visit 7.4% (not at goal) and urine ALB 80 (April 2024). Recheck A1c today. Continue Farxiga daily, but discussed with him we may need to restart Metformin if A1c not at goal -- he is not taking it.  Recommend he check BS at home a few mornings a week with goal <130.  - Continue to work with [facility] - Statin remains on board + ACE, recommend he take consistently. - Eye and foot exams up to date - Vaccinations up to date      Relevant Orders   HgB A1c   Type 2  diabetes mellitus with proteinuria (HCC)    Diagnosed in August 2022, A1c last visit 7.4% (not at goal) and urine ALB 80 (April 2024). Recheck A1c today. Continue Farxiga daily, but discussed with him we may need to restart Metformin if A1c not at goal -- he is not taking it.  Recommend he check BS at home a few mornings a week with goal <130.  - Continue to work with [facility] - Statin remains on board + ACE, recommend he take consistently. - Eye and foot exams up to date - Vaccinations up to date      Relevant Orders   HgB A1c     Other   Low back pain    Chronic at baseline. CUse NSAID only as needed.   Recommend alternating heat/ice + placing Voltaren gel on back at home.  Continue to rest and avoid lifting until improved.  May benefit from neurosurgery in future.  Referral to physiatry per request to discuss injections.  Worker's Comp # [phone] = Amanda Pea (this was his past worker, unsure if current)      Relevant Orders   Ambulatory referral to Pain Clinic   Obesity    BMI 31.80.  Recommended eating smaller high protein, low fat meals more frequently and exercising 30 mins a day 5 times a week with a goal of 10-15lb weight loss in the next 3 months. Patient voiced their understanding and motivation to adhere to these recommendations.       Other Visit Diagnoses     Medicare annual wellness visit, subsequent    -  Primary   Medicare Wellness due and performed with patient today.   Flu vaccine need       Flu vaccine provided today, educated on this.   Relevant Orders   Flu Vaccine Trivalent High Dose (Fluad) (Completed)       Discussed aspirin prophylaxis for myocardial infarction prevention and decision was it was not indicated  LABORATORY TESTING:  Health maintenance labs ordered today as discussed above.   IMMUNIZATIONS:   - Tdap: Tetanus vaccination status reviewed: last tetanus booster within 10 years. - Influenza: Up to date - Pneumovax: Up to date -  Prevnar: Up to date - Zostavax vaccine: Refused  SCREENING: - Colonoscopy: Up to date  Discussed with patient purpose of the colonoscopy is to detect colon cancer at curable precancerous or early stages   - AAA Screening: Not applicable  -Hearing Test: Not applicable  -Spirometry: Not applicable   PATIENT COUNSELING:    Sexuality: Discussed sexually transmitted diseases, partner selection, use of condoms, avoidance of unintended pregnancy  and contraceptive  alternatives.   Advised to avoid cigarette smoking.  I discussed with the patient that most people either abstain from alcohol or drink within safe limits (<=14/week and <=4 drinks/occasion for males, <=7/weeks and <= 3 drinks/occasion for females) and that the risk for alcohol disorders and other health effects rises proportionally with the number of drinks per week and how often a drinker exceeds daily limits.  Discussed cessation/primary prevention of drug use and availability of treatment for abuse.   Diet: Encouraged to adjust caloric intake to maintain  or achieve ideal body weight, to reduce intake of dietary saturated fat and total fat, to limit sodium intake by avoiding high sodium foods and not adding table salt, and to maintain adequate dietary potassium and calcium preferably from fresh fruits, vegetables, and low-fat dairy products.    Stressed the importance of regular exercise  Injury prevention: Discussed safety belts, safety helmets, smoke detector, smoking near bedding or upholstery.   Dental health: Discussed importance of regular tooth brushing, flossing, and dental visits.   Follow up plan: NEXT PREVENTATIVE PHYSICAL DUE IN 1 YEAR. Return in about 10 weeks (around 06/07/2023) for BACK PAIN -- leaving for [place] and needs to be seen before then.

## 2023-03-29 NOTE — Assessment & Plan Note (Signed)
BMI 31.80.  Recommended eating smaller high protein, low fat meals more frequently and exercising 30 mins a day 5 times a week with a goal of 10-15lb weight loss in the next 3 months. Patient voiced their understanding and motivation to adhere to these recommendations.

## 2023-03-29 NOTE — Assessment & Plan Note (Signed)
Chronic at baseline. CUse NSAID only as needed.   Recommend alternating heat/ice + placing Voltaren gel on back at home.  Continue to rest and avoid lifting until improved.  May benefit from neurosurgery in future.  Referral to physiatry per request to discuss injections.  Worker's Comp # [phone] = Amanda Pea (this was his past worker, unsure if current)

## 2023-03-30 ENCOUNTER — Other Ambulatory Visit: Payer: Self-pay | Admitting: Nurse Practitioner

## 2023-03-30 LAB — COMPREHENSIVE METABOLIC PANEL
ALT: 24 [IU]/L (ref 0–44)
AST: 22 [IU]/L (ref 0–40)
Albumin: 4.9 g/dL — ABNORMAL HIGH (ref 3.8–4.8)
Alkaline Phosphatase: 124 [IU]/L — ABNORMAL HIGH (ref 44–121)
BUN/Creatinine Ratio: 13 (ref 10–24)
BUN: 11 mg/dL (ref 8–27)
Bilirubin Total: 0.4 mg/dL (ref 0.0–1.2)
CO2: 25 mmol/L (ref 20–29)
Calcium: 9.8 mg/dL (ref 8.6–10.2)
Chloride: 98 mmol/L (ref 96–106)
Creatinine, Ser: 0.84 mg/dL (ref 0.76–1.27)
Globulin, Total: 3 g/dL (ref 1.5–4.5)
Glucose: 111 mg/dL — ABNORMAL HIGH (ref 70–99)
Potassium: 4.4 mmol/L (ref 3.5–5.2)
Sodium: 138 mmol/L (ref 134–144)
Total Protein: 7.9 g/dL (ref 6.0–8.5)
eGFR: 93 mL/min/{1.73_m2} (ref >59)

## 2023-03-30 LAB — LIPID PANEL W/O CHOL/HDL RATIO
Cholesterol, Total: 211 mg/dL — ABNORMAL HIGH (ref 100–199)
HDL: 39 mg/dL — ABNORMAL LOW (ref >39)
LDL Chol Calc (NIH): 121 mg/dL — ABNORMAL HIGH (ref 0–99)
Triglycerides: 287 mg/dL — ABNORMAL HIGH (ref 0–149)
VLDL Cholesterol Cal: 51 mg/dL — ABNORMAL HIGH (ref 5–40)

## 2023-03-30 LAB — HEMOGLOBIN A1C
Est. average glucose Bld gHb Est-mCnc: 166 mg/dL
Hgb A1c MFr Bld: 7.4 % — ABNORMAL HIGH (ref 4.8–5.6)

## 2023-03-30 MED ORDER — ROSUVASTATIN CALCIUM 40 MG PO TABS: 40.0000 mg | ORAL_TABLET | Freq: Every day | ORAL | 4 refills | Status: DC

## 2023-03-30 NOTE — Progress Notes (Signed)
Contacted via MyChart -- however does not consistently check, will need spanish interpreter to assist with results: Good afternoon Dale Morton, your labs have returned: - A1c is 7.4%, similar to last check.  As we discussed please ensure you are taking both the Metformin and the [place].  It is okay to take them together.  Take both as instructed on bottles wit goal to get A1c <7%. - Kidney and liver function are normal. - Cholesterol levels remain elevated.  I am going to stop your Atorvastatin and start Rosuvastatin 40 MG which may offer better control of levels and help with stroke prevention.  Any questions? Keep being stellar!!  Thank you for allowing me to participate in your care.  I appreciate you. Kindest regards, Elek Holderness

## 2023-04-29 ENCOUNTER — Other Ambulatory Visit: Payer: Self-pay | Admitting: Nurse Practitioner

## 2023-04-30 NOTE — Telephone Encounter (Signed)
Requested Prescriptions  Pending Prescriptions Disp Refills   metFORMIN (GLUCOPHAGE) 500 MG tablet [Pharmacy Med Name: METFORMIN HCL 500 MG TABLET] 180 tablet 1    Sig: TAKE 1 TABLET BY MOUTH 2 TIMES DAILY WITH A MEAL.     Endocrinology:  Diabetes - Biguanides Failed - 04/29/2023 12:07 PM      Failed - B12 Level in normal range and within 720 days    No results found for: "VITAMINB12"       Passed - Cr in normal range and within 360 days    Creatinine, Ser  Date Value Ref Range Status  03/29/2023 0.84 0.76 - 1.27 mg/dL Final         Passed - HBA1C is between 0 and 7.9 and within 180 days    HB A1C (BAYER DCA - WAIVED)  Date Value Ref Range Status  12/25/2022 7.4 (H) 4.8 - 5.6 % Final    Comment:             Prediabetes: 5.7 - 6.4          Diabetes: >6.4          Glycemic control for adults with diabetes: <7.0    Hgb A1c MFr Bld  Date Value Ref Range Status  03/29/2023 7.4 (H) 4.8 - 5.6 % Final    Comment:             Prediabetes: 5.7 - 6.4          Diabetes: >6.4          Glycemic control for adults with diabetes: <7.0          Passed - eGFR in normal range and within 360 days    GFR calc Af Amer  Date Value Ref Range Status  11/13/2016 104 >59 mL/min/1.73 Final   GFR calc non Af Amer  Date Value Ref Range Status  11/13/2016 90 >59 mL/min/1.73 Final   eGFR  Date Value Ref Range Status  03/29/2023 93 >59 mL/min/1.73 Final         Passed - Valid encounter within last 6 months    Recent Outpatient Visits           1 month ago Medicare annual wellness visit, subsequent   [place] [facility] [place], [place] T, NP   4 months ago Type 2 diabetes mellitus with obesity (HCC)   [place] [facility] [place], [place] T, NP   5 months ago Capsulitis of right shoulder   [place] [facility] M[place], Erin E, PA-C   7 months ago Type 2 diabetes mellitus with obesity (HCC)   [place] [facility] [place], Corrie Dandy  T, NP   1 year ago Cervical radiculopathy   [place] [facility] [place], Dorie Rank, NP       Future Appointments             In 1 month [place], Baker T, NP  [facility]            Passed - CBC within normal limits and completed in the last 12 months    WBC  Date Value Ref Range Status  12/25/2022 6.4 3.4 - 10.8 x10E3/uL Final   RBC  Date Value Ref Range Status  12/25/2022 4.91 4.14 - 5.80 x10E6/uL Final   Hemoglobin  Date Value Ref Range Status  12/25/2022 14.8 13.0 - 17.7 g/dL Final   Hematocrit  Date Value Ref Range Status  12/25/2022 44.1 37.5 - 51.0 % Final   MCHC  Date Value Ref Range Status  12/25/2022 33.6 31.5 - 35.7 g/dL Final   [facility]  Date Value Ref Range Status  12/25/2022 30.1 26.6 - 33.0 pg Final   MCV  Date Value Ref Range Status  12/25/2022 90 79 - 97 fL Final   No results found for: "PLTCOUNTKUC", "LABPLAT", "POCPLA" RDW  Date Value Ref Range Status  12/25/2022 12.5 11.6 - 15.4 % Final

## 2023-05-14 ENCOUNTER — Encounter: Payer: Self-pay | Admitting: Nurse Practitioner

## 2023-05-14 ENCOUNTER — Ambulatory Visit (INDEPENDENT_AMBULATORY_CARE_PROVIDER_SITE_OTHER): Payer: Medicare Other | Admitting: Nurse Practitioner

## 2023-05-14 VITALS — BP 112/68 | HR 73 | Temp 98.2°F | Ht 66.0 in | Wt 197.8 lb

## 2023-05-14 DIAGNOSIS — K219 Gastro-esophageal reflux disease without esophagitis: Secondary | ICD-10-CM

## 2023-05-14 MED ORDER — HYDROCORTISONE (PERIANAL) 2.5 % EX CREA: 1.0000 | TOPICAL_CREAM | Freq: Two times a day (BID) | CUTANEOUS | 2 refills | Status: AC

## 2023-05-14 NOTE — Patient Instructions (Signed)
Opciones de alimentos para pacientes adultos con enfermedad de reflujo gastroesofgico Food Choices for Gastroesophageal Reflux Disease, Adult Si tiene enfermedad de reflujo gastroesofgico (ERGE), los alimentos que consume y los hbitos de alimentacin son muy importantes. Elegir los alimentos adecuados puede ayudar a [employer]. Piense en consultar a un experto en alimentacin (nutricionista) para que lo ayude a Associate Professor. Consejos para seguir Consulting civil engineer las etiquetas de los alimentos Elija alimentos que tengan bajo contenido de grasas saturadas. Los alimentos que pueden ayudar con los sntomas incluyen los siguientes: Alimentos que tienen menos del 5 % de los valores diarios (VD) de grasa. Alimentos que tienen 0 gramos de grasas trans. Al cocinar No frer los alimentos. Cocinar la comida al horno, al vapor, a la plancha o en la parrilla. Estos son mtodos que no necesitan mucha grasa para Water quality scientist. Para agregar sabor, trate de consumir hierbas con bajo contenido de picante y [place]. Planificacin de las comidas  Elegir alimentos saludables con bajo contenido de [place], por ejemplo: Frutas y vegetales. Cereales integrales. Productos lcteos con bajo contenido de [place]. Vita Barley, pescado y aves. Haga comidas pequeas frecuentes en lugar de 3 comidas abundantes al da. Coma lentamente, en un lugar donde est relajado. Evite agacharse o recostarse hasta 2 o 3 horas despus de haber comido. Limite los alimentos con alto contenido graso como las carnes grasas o los alimentos fritos. Limite su ingesta de alimentos grasos, como aceites, [place] y [place]. Evite lo siguiente si el mdico se lo indica: Consumir alimentos que le ocasionen sntomas. Pueden ser distintos para cada persona. Lleve un registro de los alimentos para identificar aquellos que le causen sntomas. Alcohol. Beber mucha cantidad de lquido con las comidas. Comer 2 o 3 horas antes de  acostarse. Estilo de vida Mantenga un peso saludable. Pregntele a su mdico cul es el peso saludable para usted. Si necesita perder peso, hable con su mdico para hacerlo de manera segura. Haga actividad fsica durante, al menos, 30 minutos 5 das por semana o ms, o como se lo haya indicado el mdico. Use ropa suelta. No fume ni consuma ningn producto que contenga nicotina o tabaco. Si necesita ayuda para dejar de fumar, consulte al mdico. Duerma con la cabecera de la cama ms elevada que los pies. Use una cua debajo del colchn o bloques debajo del armazn de la cama para Pharmacologist la cabecera de la cama elevada. Mastique chicle sin azcar despus de las comidas. Qu alimentos debo comer?  Siga una dieta saludable y bien equilibrada que incluya abundantes frutas, verduras, cereales integrales, productos lcteos descremados, carnes magras, pescado y aves. Cada persona es diferente. Los alimentos que pueden causar sntomas en una persona pueden no causar sntomas en otra persona. Trabaje con el mdico para hallar alimentos que sean seguros para usted. Es posible que los productos que se enumeran ms [place] no constituyan una lista completa de lo que puede comer y Product manager. Pngase en contacto con un experto en alimentos para conocer ms opciones. Qu alimentos debo evitar? Limitar algunos de estos alimentos puede ayudar a Chief Operating Officer los sntomas de [place]. Cada persona es diferente. Consulte a un experto en alimentacin o a su mdico para que lo ayude a Clinical research associate los alimentos exactos que debe evitar, si los hubiere. Frutas Cualquier fruta que est preparada con grasa agregada. Cualquier fruta que le ocasione sntomas. Para algunas personas, estas pueden incluir, las frutas ctricas como naranja, pomelo, pia y limn. Verduras Verduras fritas en abundante aceite. Papas fritas.  Cualquier verdura que est preparada con grasa agregada. Cualquier verdura que le ocasione sntomas. Para algunas personas,  estas pueden incluir tomates y productos con tomate, [place], cebollas y [place], y rbanos picantes. Granos Pasteles o panes sin levadura con grasa agregada. Carnes y [address]as [address] de alto contenido graso como carne grasa de vaca o cerdo, salchichas, costillas, jamn, salchicha, salame y tocino. Carnes o protenas fritas, lo que incluye pescado frito y pollo frito. Frutos secos y Civil engineer, contracting de frutos secos, en grandes cantidades. Lcteos Leche entera y [place] con chocolate. Tami Ribas. Crema. Helado. Queso crema. Batidos con [employer]. Grasas y [employer]. Margarina. Lardo. Mantequilla clarificada. Bebidas Caf y t negro, con o sin cafena. Bebidas con gas. Refrescos. Bebidas energizantes. Jugo de fruta hecho con frutas cidas, como naranja o pomelo. Jugo de tomate. Bebidas alcohlicas. Dulces y postres Chocolate y cacao. Rosquillas. Alios y condimentos Pimienta. Menta y mentol. Sal agregada. Cualquier condimento, hierbas o aderezos que le ocasionen sntomas. Para algunas personas, esto puede incluir curry, salsa picante o aderezos para ensalada a base de vinagre. Es posible que los productos que se enumeran ms arriba no constituyan una lista completa de lo que no debera comer y Product manager. Pngase en contacto con un experto en alimentos para conocer ms opciones. Preguntas para hacerle al [employer] Los cambios en la dieta y en el estilo de vida a menudo son los primeros pasos que se toman para Company secretary los sntomas de [place]. Si los [employer] dieta y el estilo de vida no ayudan, hable con el mdico sobre el uso de medicamentos. Dnde buscar ms informacin Arts development officer for Gastrointestinal Disorders (Fundacin Internacional para los Trastornos Gastrointestinales): aboutgerd.org Resumen Si tiene [employer], las elecciones de alimentos y el [place] de vida son muy importantes para ayudar a Consulting civil engineer sntomas. Haga comidas pequeas durante Glass blower/designer de 3 comidas abundantes.  Coma lentamente y en un lugar donde est relajado. Evite agacharse o recostarse hasta 2 o 3 horas despus de haber comido. Limite los alimentos con alto contenido graso como las carnes grasas o los alimentos fritos. Esta informacin no tiene Theme park manager el consejo del mdico. Asegrese de hacerle al mdico cualquier pregunta que tenga. Document Revised: 01/05/2020 Document Reviewed: 01/05/2020 Elsevier Patient Education  2024 [employer].

## 2023-05-14 NOTE — Assessment & Plan Note (Signed)
Chronic with worsening.  Recommend he start taking PPI daily for now, which he has not been doing.  Recommend heavy focus on diet changes at home.  Mag level annually.  Risks of PPI use were discussed with patient including bone loss, C. Diff diarrhea, pneumonia, infections, CKD, electrolyte abnormalities.  Verbalizes understanding and chooses to continue the medication.  Anusol sent in for hemorrhoids.

## 2023-05-14 NOTE — Progress Notes (Signed)
BP 112/68   Pulse 73   Temp 98.2 F (36.8 C) (Oral)   Ht 5\' 6"  (1.676 m)   Wt 197 lb 12.8 oz (89.7 kg)   SpO2 97%   BMI 31.93 kg/m    Subjective:    Patient ID: Dale Morton, male    DOB: November 10, 1950, 72 y.o.   MRN: [id number]  HPI: [name] is a 72 y.o. male  Chief Complaint  Patient presents with   Gastroesophageal Reflux    Patient states he has been having an increase in his acid reflux and burning in his stomach for the last 2 weeks. Wants to discuss increasing his Omeprazole   Interpreter at bedside to assist.  GERD Is taking Omeprazole 40 MG daily (just started taking every day), but report is having increase heart burn.  Over past two weeks has gotten worse.  Does continue to take Meloxicam every day for pain.  He has been taking current Omeprazole dose twice a day and not as ordered -- once a day.  Also notices his rectal area gets itchy and uncomfortable at times. GERD control status: uncontrolled Satisfied with current treatment? no Heartburn frequency: every day at present Medication side effects: no  Medication compliance: worse Previous GERD medications: none Antacid use frequency:  every day at present, prior only PRN Duration: chronic Nature: burning Location: epigastric Heartburn duration: hours, until medicine helps Alleviatiating factors: PPI Aggravating factors: spicy foods Dysphagia: no Odynophagia:  no Hematemesis: no Blood in stool: no EGD: no   Relevant past medical, surgical, family and social history reviewed and updated as indicated. Interim medical history since our last visit reviewed. Allergies and medications reviewed and updated.  Review of Systems  Constitutional:  Negative for activity change, diaphoresis, fatigue and fever.  Respiratory:  Negative for cough, chest tightness, shortness of breath and wheezing.   Cardiovascular:  Negative for chest pain, palpitations and leg swelling.  Gastrointestinal:  Positive for  abdominal pain (epigastric). Negative for abdominal distention, constipation, diarrhea, nausea, rectal pain and vomiting.  Neurological: Negative.   Psychiatric/Behavioral: Negative.      Per HPI unless specifically indicated above     Objective:    BP 112/68   Pulse 73   Temp 98.2 F (36.8 C) (Oral)   Ht 5\' 6"  (1.676 m)   Wt 197 lb 12.8 oz (89.7 kg)   SpO2 97%   BMI 31.93 kg/m   Wt Readings from Last 3 Encounters:  05/14/23 197 lb 12.8 oz (89.7 kg)  03/29/23 197 lb (89.4 kg)  12/25/22 199 lb 6.4 oz (90.4 kg)    Physical Exam Vitals and nursing note reviewed.  Constitutional:      General: He is awake. He is not in acute distress.    Appearance: He is well-developed and well-groomed. He is not ill-appearing or toxic-appearing.  HENT:     Head: Normocephalic.     Right Ear: Hearing and external ear normal.     Left Ear: Hearing and external ear normal.  Eyes:     General: Lids are normal.     Extraocular Movements: Extraocular movements intact.     Conjunctiva/sclera: Conjunctivae normal.  Neck:     Thyroid: No thyromegaly.     Vascular: No carotid bruit.  Cardiovascular:     Rate and Rhythm: Normal rate and regular rhythm.     Heart sounds: Normal heart sounds. No murmur heard.    No gallop.  Pulmonary:     Effort: No  accessory muscle usage or respiratory distress.     Breath sounds: Normal breath sounds.  Abdominal:     General: Bowel sounds are normal. There is no distension.     Palpations: Abdomen is soft.     Tenderness: There is no abdominal tenderness.  Musculoskeletal:     Cervical back: Full passive range of motion without pain.     Right lower leg: No edema.     Left lower leg: No edema.  Lymphadenopathy:     Cervical: No cervical adenopathy.  Skin:    General: Skin is warm.     Capillary Refill: Capillary refill takes less than 2 seconds.  Neurological:     Mental Status: He is alert and oriented to person, place, and time.     Deep Tendon  Reflexes: Reflexes are normal and symmetric.     Reflex Scores:      Brachioradialis reflexes are 2+ on the right side and 2+ on the left side.      Patellar reflexes are 2+ on the right side and 2+ on the left side. Psychiatric:        Attention and Perception: Attention normal.        Mood and Affect: Mood normal.        Speech: Speech normal.        Behavior: Behavior normal. Behavior is cooperative.        Thought Content: Thought content normal.    Results for orders placed or performed in visit on 03/29/23  HgB A1c  Result Value Ref Range   Hgb A1c MFr Bld 7.4 (H) 4.8 - 5.6 %   Est. average glucose Bld gHb Est-mCnc 166 mg/dL  Comprehensive metabolic panel  Result Value Ref Range   Glucose 111 (H) 70 - 99 mg/dL   BUN 11 8 - 27 mg/dL   Creatinine, Ser 1.61 0.76 - 1.27 mg/dL   eGFR 93 >09 UE/AVW/0.98   BUN/Creatinine Ratio 13 10 - 24   Sodium 138 134 - 144 mmol/L   Potassium 4.4 3.5 - 5.2 mmol/L   Chloride 98 96 - 106 mmol/L   CO2 25 20 - 29 mmol/L   Calcium 9.8 8.6 - 10.2 mg/dL   Total Protein 7.9 6.0 - 8.5 g/dL   Albumin 4.9 (H) 3.8 - 4.8 g/dL   Globulin, Total 3.0 1.5 - 4.5 g/dL   Bilirubin Total 0.4 0.0 - 1.2 mg/dL   Alkaline Phosphatase 124 (H) 44 - 121 IU/L   AST 22 0 - 40 IU/L   ALT 24 0 - 44 IU/L  Lipid Panel w/o Chol/HDL Ratio  Result Value Ref Range   Cholesterol, Total 211 (H) 100 - 199 mg/dL   Triglycerides 119 (H) 0 - 149 mg/dL   HDL 39 (L) >14 mg/dL   VLDL Cholesterol Cal 51 (H) 5 - 40 mg/dL   LDL Chol Calc (NIH) 782 (H) 0 - 99 mg/dL      Assessment & Plan:   Problem List Items Addressed This Visit       Digestive   Gastroesophageal reflux disease without esophagitis - Primary    Chronic with worsening.  Recommend he start taking PPI daily for now, which he has not been doing.  Recommend heavy focus on diet changes at home.  Mag level annually.  Risks of PPI use were discussed with patient including bone loss, C. Diff diarrhea, pneumonia,  infections, CKD, electrolyte abnormalities.  Verbalizes understanding and chooses to continue the medication.  Anusol  sent in for hemorrhoids.         Follow up plan: Return for as scheduled upcoming - December 23rd.

## 2023-05-29 NOTE — Patient Instructions (Signed)
Be Involved in Caring For Your Health:  Taking Medications When medications are taken as directed, they can greatly improve your health. But if they are not taken as prescribed, they may not work. In some cases, not taking them correctly can be harmful. To help ensure your treatment remains effective and safe, understand your medications and how to take them. Bring your medications to each visit for review by your provider.  Your lab results, notes, and after visit summary will be available on My Chart. We strongly encourage you to use this feature. If lab results are abnormal the clinic will contact you with the appropriate steps. If the clinic does not contact you assume the results are satisfactory. You can always view your results on My Chart. If you have questions regarding your health or results, please contact the clinic during office hours. You can also ask questions on My Chart.  We at [facility] are grateful that you chose [place] to provide your care. We strive to provide evidence-based and compassionate care and are always looking for feedback. If you get a survey from the clinic please complete this so we can hear your opinions.  Diabetes Mellitus and Foot Care Diabetes, also called diabetes mellitus, may cause problems with your feet and legs because of poor blood flow (circulation). Poor circulation may make your skin: Become thinner and drier. Break more easily. Heal more slowly. Peel and crack. You may also have nerve damage (neuropathy). This can cause decreased feeling in your legs and feet. This means that you may not notice minor injuries to your feet that could lead to more serious problems. Finding and treating problems early is the best way to prevent future foot problems. How to care for your feet Foot hygiene  Wash your feet daily with warm water and mild soap. Do not use hot water. Then, pat your feet and the areas between your toes until they are fully dry. Do  not soak your feet. This can dry your skin. Trim your toenails straight across. Do not dig under them or around the cuticle. File the edges of your nails with an emery board or nail file. Apply a moisturizing lotion or petroleum jelly to the skin on your feet and to dry, brittle toenails. Use lotion that does not contain alcohol and is unscented. Do not apply lotion between your toes. Shoes and socks Wear clean socks or stockings every day. Make sure they are not too tight. Do not wear knee-high stockings. These may decrease blood flow to your legs. Wear shoes that fit well and have enough cushioning. Always look in your shoes before you put them on to be sure there are no objects inside. To break in new shoes, wear them for just a few hours a day. This prevents injuries on your feet. Wounds, scrapes, corns, and calluses  Check your feet daily for blisters, cuts, bruises, sores, and redness. If you cannot see the bottom of your feet, use a mirror or ask someone for help. Do not cut off corns or calluses or try to remove them with medicine. If you find a minor scrape, cut, or break in the skin on your feet, keep it and the skin around it clean and dry. You may clean these areas with mild soap and water. Do not clean the area with peroxide, alcohol, or iodine. If you have a wound, scrape, corn, or callus on your foot, look at it several times a day to make sure it  is healing and not infected. Check for: Redness, swelling, or pain. Fluid or blood. Warmth. Pus or a bad smell. General tips Do not cross your legs. This may decrease blood flow to your feet. Do not use heating pads or hot water bottles on your feet. They may burn your skin. If you have lost feeling in your feet or legs, you may not know this is happening until it is too late. Protect your feet from hot and cold by wearing shoes, such as at the beach or on hot pavement. Schedule a complete foot exam at least once a year or more often if  you have foot problems. Report any cuts, sores, or bruises to your health care provider right away. Where to find more information American Diabetes Association: diabetes.org Association of Diabetes Care & Education Specialists: diabeteseducator.org Contact a health care provider if: You have a condition that increases your risk of infection, and you have any cuts, sores, or bruises on your feet. You have an injury that is not healing. You have redness on your legs or feet. You feel burning or tingling in your legs or feet. You have pain or cramps in your legs and feet. Your legs or feet are numb. Your feet always feel cold. You have pain around any toenails. Get help right away if: You have a wound, scrape, corn, or callus on your foot and: You have signs of infection. You have a fever. You have a red line going up your leg. This information is not intended to replace advice given to you by your health care provider. Make sure you discuss any questions you have with your health care provider. Document Revised: 11/26/2021 Document Reviewed: 11/26/2021 Elsevier Patient Education  2024 [employer].

## 2023-05-31 ENCOUNTER — Encounter: Payer: Self-pay | Admitting: Nurse Practitioner

## 2023-05-31 ENCOUNTER — Ambulatory Visit (INDEPENDENT_AMBULATORY_CARE_PROVIDER_SITE_OTHER): Payer: Worker's Compensation | Admitting: Nurse Practitioner

## 2023-05-31 VITALS — BP 109/71 | HR 64 | Temp 98.4°F | Wt 196.2 lb

## 2023-05-31 DIAGNOSIS — M5441 Lumbago with sciatica, right side: Secondary | ICD-10-CM

## 2023-05-31 DIAGNOSIS — E66811 Obesity, class 1: Secondary | ICD-10-CM | POA: Diagnosis not present

## 2023-05-31 DIAGNOSIS — E669 Obesity, unspecified: Secondary | ICD-10-CM

## 2023-05-31 DIAGNOSIS — E6609 Other obesity due to excess calories: Secondary | ICD-10-CM

## 2023-05-31 DIAGNOSIS — E1169 Type 2 diabetes mellitus with other specified complication: Secondary | ICD-10-CM | POA: Diagnosis not present

## 2023-05-31 DIAGNOSIS — M5442 Lumbago with sciatica, left side: Secondary | ICD-10-CM | POA: Diagnosis not present

## 2023-05-31 DIAGNOSIS — E1129 Type 2 diabetes mellitus with other diabetic kidney complication: Secondary | ICD-10-CM | POA: Diagnosis not present

## 2023-05-31 DIAGNOSIS — E785 Hyperlipidemia, unspecified: Secondary | ICD-10-CM

## 2023-05-31 DIAGNOSIS — Z6832 Body mass index (BMI) 32.0-32.9, adult: Secondary | ICD-10-CM

## 2023-05-31 DIAGNOSIS — Z7984 Long term (current) use of oral hypoglycemic drugs: Secondary | ICD-10-CM

## 2023-05-31 DIAGNOSIS — R809 Proteinuria, unspecified: Secondary | ICD-10-CM

## 2023-05-31 DIAGNOSIS — G8929 Other chronic pain: Secondary | ICD-10-CM

## 2023-05-31 LAB — BAYER DCA HB A1C WAIVED: HB A1C (BAYER DCA - WAIVED): 6.3 % — ABNORMAL HIGH (ref 4.8–5.6)

## 2023-05-31 MED ORDER — METFORMIN HCL 500 MG PO TABS: 500.0000 mg | ORAL_TABLET | Freq: Two times a day (BID) | ORAL | 4 refills | Status: AC

## 2023-05-31 MED ORDER — MELOXICAM 15 MG PO TABS: 15.0000 mg | ORAL_TABLET | Freq: Every day | ORAL | 4 refills | Status: AC

## 2023-05-31 MED ORDER — ATORVASTATIN CALCIUM 20 MG PO TABS: 20.0000 mg | ORAL_TABLET | Freq: Every day | ORAL | 4 refills | Status: DC

## 2023-05-31 MED ORDER — OMEPRAZOLE 40 MG PO CPDR: 40.0000 mg | DELAYED_RELEASE_CAPSULE | Freq: Every day | ORAL | 4 refills | Status: AC

## 2023-05-31 MED ORDER — LISINOPRIL 5 MG PO TABS: 5.0000 mg | ORAL_TABLET | Freq: Every day | ORAL | 4 refills | Status: AC

## 2023-05-31 NOTE — Assessment & Plan Note (Signed)
BMI 31.67. Recommended eating smaller high protein, low fat meals more frequently and exercising 30 mins a day 5 times a week with a goal of 10-15lb weight loss in the next 3 months. Patient voiced their understanding and motivation to adhere to these recommendations.

## 2023-05-31 NOTE — Assessment & Plan Note (Signed)
Diagnosed in August 2022, A1c last visit 7.4% (not at goal) and urine ALB 80 (April 2024). A1c today much improved at 6.3%. Continue Farxiga daily and Metformin.  Recommend he check BS at home a few mornings a week with goal <130.  - Continue to work with [facility] - Statin remains on board + ACE, recommend he take consistently. - Eye and foot exams up to date - Vaccinations up to date

## 2023-05-31 NOTE — Assessment & Plan Note (Signed)
Chronic, ongoing.  Continue current medication regimen and adjust as needed. Lipid panel today. 

## 2023-05-31 NOTE — Assessment & Plan Note (Signed)
Chronic at baseline. Use NSAID only as needed.   Recommend alternating heat/ice + placing Voltaren gel on back at home.  Continue to rest and avoid lifting until improved.  May benefit from neurosurgery in future.  [place]  Worker's Comp # [phone] = Amanda Pea (this was his past worker, unsure if current)

## 2023-05-31 NOTE — Progress Notes (Signed)
BP 109/71 (BP Location: Left Arm)   Pulse 64   Temp 98.4 F (36.9 C) (Oral)   Wt 196 lb 3.2 oz (89 kg)   SpO2 98%   BMI 31.67 kg/m    Subjective:    Patient ID: Dale Morton, male    DOB: 1951-05-24, 72 y.o.   MRN: [id number]  HPI: Dale Morton is a 72 y.o. male  Chief Complaint  Patient presents with   Back Pain    Patient says his back pain is getting a little better.    Shoulder Pain    Patient says he has some shoulder pain in his R shoulder, when he raises his arm up and down.   DIABETES Diagnosed in August 2022.  A1c last visit was 7.4%. Working with [facility].  Taking Metformin 500 MG BID + Farxiga 5 MG daily (is taking daily).  Atorvastatin 20 MG daily for HLD daily. Takes Lisinopril 5 MG daily. Hypoglycemic episodes:no Polydipsia/polyuria: no Visual disturbance: no Chest pain: no Paresthesias: no Glucose Monitoring: no  Accucheck frequency: Not Checking  Fasting glucose:  Post prandial:  Evening:  Before meals: Taking Insulin?: no  Long acting insulin:  Short acting insulin: Blood Pressure Monitoring: not checking Retinal Examination: Up To Date -- [facility] Foot Exam: Up to Date Pneumovax: Up to Date Influenza: Up to Date Aspirin: no  The 10-year ASCVD risk score (Arnett DK, et al., 2019) is: 30.1%   Values used to calculate the score:     Age: 86 years     Sex: Male     Is Non-Hispanic African American: No     Diabetic: Yes     Tobacco smoker: No     Systolic Blood Pressure: 109 mmHg     Is BP treated: No     HDL Cholesterol: 39 mg/dL     Total Cholesterol: 211 mg/dL  BACK & SHOULDER PAIN  Chronic issue for many years.  Has been using Meloxicam at home. He wears back support when performing tasks.  Gets medication [place] occasionally. Can get physical therapy in [place] for right shoulder and injections -- leaves on January 8th.  Back pain is improving some and can bend down better.  History: Chronic at baseline, since work  accident in 12 or 1989.  During accident he was pulling a deck and it got broken, then pulled back during movement + has h/o falling from height during work.  Did go to physical therapy for period of time, which was beneficial -- however has not been able to get back into this due to workmen's comp not covering.  Last imaging was in October 2023 with degenerative changes to lumbar and thoracic spine.   Duration: months Mechanism of injury: work accident - chronic issue Location: low back and right shoulder Onset: chronic Severity: 9/10 at worst (stiff when sitting for too long) and at lowest 2/10  Quality: dull/burning across lower back, dull/aching shoulder Frequency: intermittent Radiation: none Aggravating factors: lifting and prolonged sitting Alleviating factors: Meloxicam and injections Status: fluctuating Treatments attempted: Flexeril, Tizanidine, Meloxicam, Gabapentin Relief with NSAIDs?: moderate Nighttime pain:  no Paresthesias / decreased sensation:  no Bowel / bladder incontinence:  no Fevers:  no Dysuria / urinary frequency:  no  Relevant past medical, surgical, family and social history reviewed and updated as indicated. Interim medical history since our last visit reviewed. Allergies and medications reviewed and updated.  Review of Systems  Constitutional:  Negative for activity  change, diaphoresis, fatigue and fever.  Respiratory:  Negative for cough, chest tightness, shortness of breath and wheezing.   Cardiovascular:  Negative for chest pain, palpitations and leg swelling.  Gastrointestinal: Negative.   Musculoskeletal:  Positive for arthralgias.  Neurological: Negative.   Psychiatric/Behavioral: Negative.      Per HPI unless specifically indicated above     Objective:    BP 109/71 (BP Location: Left Arm)   Pulse 64   Temp 98.4 F (36.9 C) (Oral)   Wt 196 lb 3.2 oz (89 kg)   SpO2 98%   BMI 31.67 kg/m   Wt Readings from Last 3 Encounters:  05/31/23  196 lb 3.2 oz (89 kg)  05/14/23 197 lb 12.8 oz (89.7 kg)  03/29/23 197 lb (89.4 kg)    Physical Exam Vitals and nursing note reviewed.  Constitutional:      General: He is not in acute distress.    Appearance: He is well-developed. He is not ill-appearing.  HENT:     Head: Normocephalic and atraumatic.     Right Ear: Hearing normal. No drainage.     Left Ear: Hearing normal. No drainage.     Mouth/Throat:     Pharynx: Uvula midline.  Eyes:     General: Lids are normal.        Right eye: No discharge.        Left eye: No discharge.     Conjunctiva/sclera: Conjunctivae normal.     Pupils: Pupils are equal, round, and reactive to light.  Neck:     Thyroid: No thyromegaly.     Vascular: No carotid bruit or JVD.     Trachea: Trachea normal.  Cardiovascular:     Rate and Rhythm: Normal rate and regular rhythm.     Heart sounds: Normal heart sounds, S1 normal and S2 normal. No murmur heard.    No gallop.  Pulmonary:     Effort: Pulmonary effort is normal. No accessory muscle usage or respiratory distress.     Breath sounds: Normal breath sounds.  Abdominal:     General: Bowel sounds are normal.     Palpations: Abdomen is soft. There is no hepatomegaly or splenomegaly.  Musculoskeletal:     Right shoulder: Normal.     Left shoulder: Normal.     Cervical back: Normal range of motion and neck supple. No erythema or rigidity. No spinous process tenderness or muscular tenderness. Normal range of motion.     Lumbar back: Tenderness present. No swelling, edema or lacerations. Decreased range of motion.     Right lower leg: No edema.     Left lower leg: No edema.  Skin:    General: Skin is warm and dry.     Capillary Refill: Capillary refill takes less than 2 seconds.  Neurological:     Mental Status: He is alert and oriented to person, place, and time.     Deep Tendon Reflexes: Reflexes are normal and symmetric.  Psychiatric:        Mood and Affect: Mood normal.        Behavior:  Behavior normal.        Thought Content: Thought content normal.        Judgment: Judgment normal.     Results for orders placed or performed in visit on 03/29/23  HgB A1c   Collection Time: 03/29/23  9:40 AM  Result Value Ref Range   Hgb A1c MFr Bld 7.4 (H) 4.8 - 5.6 %  Est. average glucose Bld gHb Est-mCnc 166 mg/dL  Comprehensive metabolic panel   Collection Time: 03/29/23  9:40 AM  Result Value Ref Range   Glucose 111 (H) 70 - 99 mg/dL   BUN 11 8 - 27 mg/dL   Creatinine, Ser 1.61 0.76 - 1.27 mg/dL   eGFR 93 >09 UE/AVW/0.98   BUN/Creatinine Ratio 13 10 - 24   Sodium 138 134 - 144 mmol/L   Potassium 4.4 3.5 - 5.2 mmol/L   Chloride 98 96 - 106 mmol/L   CO2 25 20 - 29 mmol/L   Calcium 9.8 8.6 - 10.2 mg/dL   Total Protein 7.9 6.0 - 8.5 g/dL   Albumin 4.9 (H) 3.8 - 4.8 g/dL   Globulin, Total 3.0 1.5 - 4.5 g/dL   Bilirubin Total 0.4 0.0 - 1.2 mg/dL   Alkaline Phosphatase 124 (H) 44 - 121 IU/L   AST 22 0 - 40 IU/L   ALT 24 0 - 44 IU/L  Lipid Panel w/o Chol/HDL Ratio   Collection Time: 03/29/23  9:40 AM  Result Value Ref Range   Cholesterol, Total 211 (H) 100 - 199 mg/dL   Triglycerides 119 (H) 0 - 149 mg/dL   HDL 39 (L) >14 mg/dL   VLDL Cholesterol Cal 51 (H) 5 - 40 mg/dL   LDL Chol Calc (NIH) 782 (H) 0 - 99 mg/dL      Assessment & Plan:   Problem List Items Addressed This Visit       Endocrine   Hyperlipidemia associated with type 2 diabetes mellitus (HCC)   Chronic, ongoing.  Continue current medication regimen and adjust as needed. Lipid panel today.       Relevant Medications   atorvastatin (LIPITOR) 20 MG tablet   lisinopril (ZESTRIL) 5 MG tablet   metFORMIN (GLUCOPHAGE) 500 MG tablet   Other Relevant Orders   Bayer DCA Hb A1c Waived   Lipid Panel w/o Chol/HDL Ratio   Type 2 diabetes mellitus with obesity (HCC) - Primary   Diagnosed in August 2022, A1c last visit 7.4% (not at goal) and urine ALB 80 (April 2024). A1c today much improved at 6.3%. Continue  Farxiga daily and Metformin.  Recommend he check BS at home a few mornings a week with goal <130.  - Continue to work with [facility] - Statin remains on board + ACE, recommend he take consistently. - Eye and foot exams up to date - Vaccinations up to date      Relevant Medications   atorvastatin (LIPITOR) 20 MG tablet   lisinopril (ZESTRIL) 5 MG tablet   metFORMIN (GLUCOPHAGE) 500 MG tablet   Other Relevant Orders   Bayer DCA Hb A1c Waived   Basic metabolic panel   Type 2 diabetes mellitus with proteinuria (HCC)   Diagnosed in August 2022, A1c last visit 7.4% (not at goal) and urine ALB 80 (April 2024). A1c today much improved at 6.3%. Continue Farxiga daily and Metformin.  Recommend he check BS at home a few mornings a week with goal <130.  - Continue to work with [facility] - Statin remains on board + ACE, recommend he take consistently. - Eye and foot exams up to date - Vaccinations up to date      Relevant Medications   atorvastatin (LIPITOR) 20 MG tablet   lisinopril (ZESTRIL) 5 MG tablet   metFORMIN (GLUCOPHAGE) 500 MG tablet   Other Relevant Orders   Bayer DCA Hb A1c Waived   Basic metabolic panel  Other   Low back pain   Chronic at baseline. Use NSAID only as needed.   Recommend alternating heat/ice + placing Voltaren gel on back at home.  Continue to rest and avoid lifting until improved.  May benefit from neurosurgery in future.  Will get injection to shoulder in [place]  Worker's Comp # [phone] = Amanda Pea (this was his past worker, unsure if current)      Relevant Medications   meloxicam (MOBIC) 15 MG tablet   Obesity   BMI 31.67.  Recommended eating smaller high protein, low fat meals more frequently and exercising 30 mins a day 5 times a week with a goal of 10-15lb weight loss in the next 3 months. Patient voiced their understanding and motivation to adhere to these recommendations.       Relevant Medications   metFORMIN (GLUCOPHAGE) 500  MG tablet     Follow up plan: Return in about 3 months (around 08/29/2023) for T2DM, HTN/HLD.

## 2023-06-01 LAB — BASIC METABOLIC PANEL
BUN/Creatinine Ratio: 14 (ref 10–24)
BUN: 11 mg/dL (ref 8–27)
CO2: 22 mmol/L (ref 20–29)
Calcium: 9.4 mg/dL (ref 8.6–10.2)
Chloride: 100 mmol/L (ref 96–106)
Creatinine, Ser: 0.79 mg/dL (ref 0.76–1.27)
Glucose: 140 mg/dL — ABNORMAL HIGH (ref 70–99)
Potassium: 4 mmol/L (ref 3.5–5.2)
Sodium: 138 mmol/L (ref 134–144)
eGFR: 95 mL/min/{1.73_m2} (ref >59)

## 2023-06-01 LAB — LIPID PANEL W/O CHOL/HDL RATIO
Cholesterol, Total: 168 mg/dL (ref 100–199)
HDL: 38 mg/dL — ABNORMAL LOW (ref >39)
LDL Chol Calc (NIH): 104 mg/dL — ABNORMAL HIGH (ref 0–99)
Triglycerides: 147 mg/dL (ref 0–149)
VLDL Cholesterol Cal: 26 mg/dL (ref 5–40)

## 2023-06-01 NOTE — Progress Notes (Signed)
He does not check MyChart, please call: Good day Dale Morton, your labs have returned.  Kidney function remains normal.  Cholesterol panel shows mild elevation in LDL, bad cholesterol.  Please ensure you are taking your Atorvastatin every day.  Any questions? Keep being amazing!!  Thank you for allowing me to participate in your care.  I appreciate you. Kindest regards, Bryn Saline

## 2023-06-17 ENCOUNTER — Encounter: Payer: Self-pay | Admitting: Nurse Practitioner

## 2023-08-24 ENCOUNTER — Encounter: Payer: Self-pay | Admitting: Nurse Practitioner

## 2023-08-25 ENCOUNTER — Telehealth: Payer: Self-pay

## 2023-08-25 ENCOUNTER — Other Ambulatory Visit: Payer: Self-pay

## 2023-08-25 MED ORDER — FARXIGA 5 MG PO TABS: 5.0000 mg | ORAL_TABLET | Freq: Every day | ORAL | 3 refills | Status: DC

## 2023-08-25 NOTE — Telephone Encounter (Signed)
 Per [facility] requesting a new prescription to be fax to [phone] [facility] for next refill.

## 2023-08-25 NOTE — Progress Notes (Signed)
   08/25/2023  Patient ID: Dale Morton, male   DOB: [birth year], 73 y.o.   MRN: [id number]    Lenna Gilford, PharmD, DPLA

## 2023-08-28 DIAGNOSIS — E119 Type 2 diabetes mellitus without complications: Secondary | ICD-10-CM | POA: Insufficient documentation

## 2023-08-30 ENCOUNTER — Ambulatory Visit: Admitting: Nurse Practitioner

## 2023-08-30 DIAGNOSIS — E66811 Obesity, class 1: Secondary | ICD-10-CM

## 2023-08-30 DIAGNOSIS — R809 Proteinuria, unspecified: Secondary | ICD-10-CM

## 2023-08-30 DIAGNOSIS — E119 Type 2 diabetes mellitus without complications: Secondary | ICD-10-CM

## 2023-08-30 DIAGNOSIS — E1169 Type 2 diabetes mellitus with other specified complication: Secondary | ICD-10-CM

## 2023-09-03 ENCOUNTER — Ambulatory Visit: Payer: Medicare Other | Admitting: Nurse Practitioner

## 2023-09-08 ENCOUNTER — Ambulatory Visit: Payer: Self-pay | Admitting: Nurse Practitioner

## 2023-10-09 NOTE — Patient Instructions (Signed)

## 2023-10-11 ENCOUNTER — Encounter: Payer: Self-pay | Admitting: Nurse Practitioner

## 2023-10-11 ENCOUNTER — Ambulatory Visit (INDEPENDENT_AMBULATORY_CARE_PROVIDER_SITE_OTHER): Admitting: Nurse Practitioner

## 2023-10-11 VITALS — BP 126/81 | HR 57 | Ht 66.0 in | Wt 189.4 lb

## 2023-10-11 DIAGNOSIS — E66811 Obesity, class 1: Secondary | ICD-10-CM

## 2023-10-11 DIAGNOSIS — E1169 Type 2 diabetes mellitus with other specified complication: Secondary | ICD-10-CM | POA: Diagnosis not present

## 2023-10-11 DIAGNOSIS — E1129 Type 2 diabetes mellitus with other diabetic kidney complication: Secondary | ICD-10-CM | POA: Diagnosis not present

## 2023-10-11 DIAGNOSIS — E119 Type 2 diabetes mellitus without complications: Secondary | ICD-10-CM | POA: Diagnosis not present

## 2023-10-11 DIAGNOSIS — E669 Obesity, unspecified: Secondary | ICD-10-CM

## 2023-10-11 DIAGNOSIS — M5441 Lumbago with sciatica, right side: Secondary | ICD-10-CM

## 2023-10-11 DIAGNOSIS — R809 Proteinuria, unspecified: Secondary | ICD-10-CM

## 2023-10-11 DIAGNOSIS — E6609 Other obesity due to excess calories: Secondary | ICD-10-CM

## 2023-10-11 DIAGNOSIS — M5442 Lumbago with sciatica, left side: Secondary | ICD-10-CM | POA: Diagnosis not present

## 2023-10-11 DIAGNOSIS — G8929 Other chronic pain: Secondary | ICD-10-CM

## 2023-10-11 DIAGNOSIS — E785 Hyperlipidemia, unspecified: Secondary | ICD-10-CM

## 2023-10-11 DIAGNOSIS — Z7984 Long term (current) use of oral hypoglycemic drugs: Secondary | ICD-10-CM

## 2023-10-11 LAB — MICROALBUMIN, URINE WAIVED
Creatinine, Urine Waived: 200 mg/dL (ref 10–300)
Microalb, Ur Waived: 80 mg/L — ABNORMAL HIGH (ref 0–19)

## 2023-10-11 LAB — BAYER DCA HB A1C WAIVED: HB A1C (BAYER DCA - WAIVED): 7.1 % — ABNORMAL HIGH (ref 4.8–5.6)

## 2023-10-11 NOTE — Assessment & Plan Note (Signed)
 Refer to diabetes with proteinuria plan of care.

## 2023-10-11 NOTE — Progress Notes (Signed)
 BP 126/81 (BP Location: Left Arm, Patient Position: Sitting)   Pulse (!) 57   Ht 5\' 6"  (1.676 m)   Wt 189 lb 6.4 oz (85.9 kg)   SpO2 96%   BMI 30.57 kg/m    Subjective:    Patient ID: Dale Morton, male    DOB: January 02, 1951, 73 y.o.   MRN: [id number]  HPI: Dale Morton is a 73 y.o. male  Chief Complaint  Patient presents with   Diabetes   DIABETES Diagnosed August 2022.  December A1c 6.3%. Works with [facility] Pharm[facility] as needed. Takes Metformin  500 MG BID & Farxiga  5 MG daily.   Hypoglycemic episodes:no Polydipsia/polyuria: no Visual disturbance: no Chest pain: no Paresthesias: no Glucose Monitoring: no  Accucheck frequency: Not Checking  Fasting glucose:  Post prandial:  Evening:  Before meals: Taking Insulin?: no  Long acting insulin:  Short acting insulin: Blood Pressure Monitoring: not checking Retinal Examination: Up To Date -- [facility] Foot Exam: Up to Date Pneumovax: Up to Date Influenza: Up to Date Aspirin: no  The 10-year ASCVD risk score (Arnett DK, et al., 2019) is: 34.2%   Values used to calculate the score:     Age: 68 years     Sex: Male     Is Non-Hispanic African American: No     Diabetic: Yes     Tobacco smoker: No     Systolic Blood Pressure: 126 mmHg     Is BP treated: No     HDL Cholesterol: 38 mg/dL     Total Cholesterol: 168 mg/dL  HYPERTENSION / HYPERLIPIDEMIA Takes Rosuvastatin  and Lisinopril . Satisfied with current treatment? yes Duration of hypertension: chronic BP monitoring frequency: not checking BP range:  BP medication side effects: no Duration of hyperlipidemia: chronic Cholesterol medication side effects: no Cholesterol supplements: none Medication compliance: good compliance Aspirin: no Recent stressors: no Recurrent headaches: no Visual changes: no Palpitations: no Dyspnea: no Chest pain: no Lower extremity edema: no Dizzy/lightheaded: no   BACK & SHOULDER PAIN  Chronic issue for multiple years.  Uses  Meloxicam  at home. Gets medication in [place] at times when visits. Can get physical therapy in [place] for right shoulder and injections -- leaves on January 8th.  Back pain is improving some and can bend down better.  Did lift something heavy before leaving [place] has been hurting but just a little since then. Before then it did not hurt while in [place]  HISTORY: Chronic at baseline, since work accident in 84 or 1989.  During accident he was pulling a deck and it got broken, then pulled back during movement + has h/o falling from height during work.  Did go to physical therapy for period of time, which was beneficial -- however has not been able to get back into this due to workmen's comp not covering.  Last imaging was in October 2023 with degenerative changes to lumbar and thoracic spine.   Duration: months Mechanism of injury: work accident - chronic issue Location: low back and right shoulder Onset: chronic Severity: 9/10 at worst (stiff when sitting for too long) and at lowest 2/10  Quality: dull/burning across lower back, dull/aching shoulder Frequency: intermittent Radiation: none Aggravating factors: lifting and prolonged sitting Alleviating factors: Meloxicam  and injections Status: fluctuating Treatments attempted: Flexeril , Tizanidine , Meloxicam , Gabapentin  Relief with NSAIDs?: moderate Nighttime pain:  no Paresthesias / decreased sensation:  no Bowel / bladder incontinence:  no Fevers:  no Dysuria / urinary frequency:  no  Relevant past medical, surgical, family and social history reviewed and updated as indicated. Interim medical history since our last visit reviewed. Allergies and medications reviewed and updated.  Review of Systems  Constitutional:  Negative for activity change, diaphoresis, fatigue and fever.  Respiratory:  Negative for cough, chest tightness, shortness of breath and wheezing.   Cardiovascular:  Negative for chest pain, palpitations  and leg swelling.  Gastrointestinal: Negative.   Musculoskeletal:  Positive for arthralgias.  Neurological: Negative.   Psychiatric/Behavioral: Negative.      Per HPI unless specifically indicated above     Objective:    BP 126/81 (BP Location: Left Arm, Patient Position: Sitting)   Pulse (!) 57   Ht 5\' 6"  (1.676 m)   Wt 189 lb 6.4 oz (85.9 kg)   SpO2 96%   BMI 30.57 kg/m   Wt Readings from Last 3 Encounters:  10/11/23 189 lb 6.4 oz (85.9 kg)  05/31/23 196 lb 3.2 oz (89 kg)  05/14/23 197 lb 12.8 oz (89.7 kg)    Physical Exam Vitals and nursing note reviewed.  Constitutional:      General: He is not in acute distress.    Appearance: He is well-developed. He is not ill-appearing.  HENT:     Head: Normocephalic and atraumatic.     Right Ear: Hearing normal. No drainage.     Left Ear: Hearing normal. No drainage.     Mouth/Throat:     Pharynx: Uvula midline.  Eyes:     General: Lids are normal.        Right eye: No discharge.        Left eye: No discharge.     Conjunctiva/sclera: Conjunctivae normal.     Pupils: Pupils are equal, round, and reactive to light.  Neck:     Thyroid : No thyromegaly.     Vascular: No carotid bruit or JVD.     Trachea: Trachea normal.  Cardiovascular:     Rate and Rhythm: Normal rate and regular rhythm.     Heart sounds: Normal heart sounds, S1 normal and S2 normal. No murmur heard.    No gallop.  Pulmonary:     Effort: Pulmonary effort is normal. No accessory muscle usage or respiratory distress.     Breath sounds: Normal breath sounds.  Abdominal:     General: Bowel sounds are normal.     Palpations: Abdomen is soft. There is no hepatomegaly or splenomegaly.  Musculoskeletal:     Right shoulder: Normal.     Left shoulder: Normal.     Cervical back: Normal range of motion and neck supple. No erythema or rigidity. No spinous process tenderness or muscular tenderness. Normal range of motion.     Lumbar back: Tenderness present. No  swelling, edema or lacerations. Decreased range of motion.     Right lower leg: No edema.     Left lower leg: No edema.  Skin:    General: Skin is warm and dry.     Capillary Refill: Capillary refill takes less than 2 seconds.  Neurological:     Mental Status: He is alert and oriented to person, place, and time.     Deep Tendon Reflexes: Reflexes are normal and symmetric.  Psychiatric:        Mood and Affect: Mood normal.        Behavior: Behavior normal.        Thought Content: Thought content normal.        Judgment: Judgment normal.  Results for orders placed or performed in visit on 05/31/23  Bayer DCA Hb A1c Waived   Collection Time: 05/31/23  8:25 AM  Result Value Ref Range   HB A1C (BAYER DCA - WAIVED) 6.3 (H) 4.8 - 5.6 %  Lipid Panel w/o Chol/HDL Ratio   Collection Time: 05/31/23  8:26 AM  Result Value Ref Range   Cholesterol, Total 168 100 - 199 mg/dL   Triglycerides 962 0 - 149 mg/dL   HDL 38 (L) >95 mg/dL   VLDL Cholesterol Cal 26 5 - 40 mg/dL   LDL Chol Calc (NIH) 284 (H) 0 - 99 mg/dL  Basic metabolic panel   Collection Time: 05/31/23  8:26 AM  Result Value Ref Range   Glucose 140 (H) 70 - 99 mg/dL   BUN 11 8 - 27 mg/dL   Creatinine, Ser 1.32 0.76 - 1.27 mg/dL   eGFR 95 >44 WN/UUV/2.53   BUN/Creatinine Ratio 14 10 - 24   Sodium 138 134 - 144 mmol/L   Potassium 4.0 3.5 - 5.2 mmol/L   Chloride 100 96 - 106 mmol/L   CO2 22 20 - 29 mmol/L   Calcium  9.4 8.6 - 10.2 mg/dL      Assessment & Plan:   Problem List Items Addressed This Visit       Endocrine   Type 2 diabetes mellitus with proteinuria (HCC)   Diagnosed in August 2022, A1c last visit 6.3% and urine ALB 80 (April 2024). A1c today and urine ALB. Continue Farxiga  daily and Metformin .  Recommend he check BS at home a few mornings a week with goal <130.  - Continue to work with [facility] - Statin remains on board + ACE, recommend he take consistently. - Eye and foot exams up to date -  Vaccinations up to date      Relevant Orders   Bayer DCA Hb A1c Waived   Microalbumin, Urine Waived   Type 2 diabetes mellitus with obesity (HCC) - Primary   Diagnosed in August 2022, A1c last visit 6.3% and urine ALB 80 (April 2024). A1c today and urine ALB. Continue Farxiga  daily and Metformin .  Recommend he check BS at home a few mornings a week with goal <130.  - Continue to work with [facility] - Statin remains on board + ACE, recommend he take consistently. - Eye and foot exams up to date - Vaccinations up to date      Relevant Orders   Bayer DCA Hb A1c Waived   Microalbumin, Urine Waived   TSH   Hyperlipidemia associated with type 2 diabetes mellitus (HCC)   Chronic, ongoing.  Continue current medication regimen and adjust as needed. Lipid panel today.       Relevant Orders   Bayer DCA Hb A1c Waived   Comprehensive metabolic panel with GFR   Lipid Panel w/o Chol/HDL Ratio   Diabetes mellitus treated with oral medication (HCC)   Refer to diabetes with proteinuria plan of care.      Relevant Orders   Bayer DCA Hb A1c Waived     Other   Obesity   BMI 30.57.  Recommended eating smaller high protein, low fat meals more frequently and exercising 30 mins a day 5 times a week with a goal of 10-15lb weight loss in the next 3 months. Patient voiced their understanding and motivation to adhere to these recommendations.       Low back pain   Chronic at baseline. Use NSAID only as needed.  Recommend alternating heat/ice + placing Voltaren gel on back at home.  Continue to rest and avoid lifting until improved.  May benefit from neurosurgery in future.  Gets injection [place] shoulders in [place]  Referral to PT per request placed.  Worker's Comp # [phone] = Nat Badger (this was his past worker, unsure if current)      Relevant Orders   Ambulatory referral to Physical Therapy     Follow up plan: Return in about 6 months (around 04/12/2024) for T2DM,  HTN/HLD.

## 2023-10-11 NOTE — Assessment & Plan Note (Signed)
 Chronic, ongoing.  Continue current medication regimen and adjust as needed. Lipid panel today.

## 2023-10-11 NOTE — Assessment & Plan Note (Signed)
 Diagnosed in August 2022, A1c last visit 6.3% and urine ALB 80 (April 2024). A1c today and urine ALB. Continue Farxiga  daily and Metformin .  Recommend he check BS at home a few mornings a week with goal <130.  - Continue to work with [facility] - Statin remains on board + ACE, recommend he take consistently. - Eye and foot exams up to date - Vaccinations up to date

## 2023-10-11 NOTE — Assessment & Plan Note (Signed)
 Chronic at baseline. Use NSAID only as needed.   Recommend alternating heat/ice + placing Voltaren gel on back at home.  Continue to rest and avoid lifting until improved.  May benefit from neurosurgery in future.  Gets injection [place] shoulders in [place]  Referral to PT per request placed.  Worker's Comp # [phone] = Nat Badger (this was his past worker, unsure if current)

## 2023-10-11 NOTE — Assessment & Plan Note (Signed)
 BMI 30.57.  Recommended eating smaller high protein, low fat meals more frequently and exercising 30 mins a day 5 times a week with a goal of 10-15lb weight loss in the next 3 months. Patient voiced their understanding and motivation to adhere to these recommendations.

## 2023-10-12 ENCOUNTER — Ambulatory Visit: Admitting: Nurse Practitioner

## 2023-10-12 LAB — LIPID PANEL W/O CHOL/HDL RATIO
Cholesterol, Total: 245 mg/dL — ABNORMAL HIGH (ref 100–199)
HDL: 45 mg/dL (ref >39)
LDL Chol Calc (NIH): 155 mg/dL — ABNORMAL HIGH (ref 0–99)
Triglycerides: 247 mg/dL — ABNORMAL HIGH (ref 0–149)
VLDL Cholesterol Cal: 45 mg/dL — ABNORMAL HIGH (ref 5–40)

## 2023-10-12 LAB — COMPREHENSIVE METABOLIC PANEL WITH GFR
ALT: 24 IU/L (ref 0–44)
AST: 23 IU/L (ref 0–40)
Albumin: 5.1 g/dL — ABNORMAL HIGH (ref 3.8–4.8)
Alkaline Phosphatase: 148 IU/L — ABNORMAL HIGH (ref 44–121)
BUN/Creatinine Ratio: 21 (ref 10–24)
BUN: 14 mg/dL (ref 8–27)
Bilirubin Total: 0.4 mg/dL (ref 0.0–1.2)
CO2: 19 mmol/L — ABNORMAL LOW (ref 20–29)
Calcium: 9.7 mg/dL (ref 8.6–10.2)
Chloride: 100 mmol/L (ref 96–106)
Creatinine, Ser: 0.67 mg/dL — ABNORMAL LOW (ref 0.76–1.27)
Globulin, Total: 3.1 g/dL (ref 1.5–4.5)
Glucose: 110 mg/dL — ABNORMAL HIGH (ref 70–99)
Potassium: 4.1 mmol/L (ref 3.5–5.2)
Sodium: 137 mmol/L (ref 134–144)
Total Protein: 8.2 g/dL (ref 6.0–8.5)
eGFR: 100 mL/min/{1.73_m2} (ref >59)

## 2023-10-12 LAB — TSH: TSH: 2.54 u[IU]/mL (ref 0.450–4.500)

## 2023-10-12 NOTE — Progress Notes (Signed)
 Please alert patient labs have returned as has not checked MyChart since 2020. Good day Dale Morton, your labs have returned: - Lipid panel is showing elevations in total cholesterol and LDL, bad cholesterol.  Have you been taking your Rosuvastatin  consistently, every day?  Were you fasting? Please let me know because if yes to both of these we may need to adjust medications. - Kidney and liver function are stable. - Thyroid  level normal.  Any questions? Keep being awesome!!  Thank you for allowing me to participate in your care.  I appreciate you. Kindest regards, Doneshia Hill

## 2023-10-19 ENCOUNTER — Ambulatory Visit: Payer: Self-pay

## 2023-11-02 ENCOUNTER — Ambulatory Visit (INDEPENDENT_AMBULATORY_CARE_PROVIDER_SITE_OTHER): Admitting: Nurse Practitioner

## 2023-11-02 ENCOUNTER — Encounter: Payer: Self-pay | Admitting: Nurse Practitioner

## 2023-11-02 VITALS — BP 122/70 | Temp 98.8°F | Resp 15 | Ht 65.98 in | Wt 179.0 lb

## 2023-11-02 DIAGNOSIS — Z712 Person consulting for explanation of examination or test findings: Secondary | ICD-10-CM | POA: Diagnosis not present

## 2023-11-02 DIAGNOSIS — N3000 Acute cystitis without hematuria: Secondary | ICD-10-CM

## 2023-11-02 DIAGNOSIS — R300 Dysuria: Secondary | ICD-10-CM | POA: Diagnosis not present

## 2023-11-02 LAB — URINALYSIS, ROUTINE W REFLEX MICROSCOPIC
Bilirubin, UA: NEGATIVE
Ketones, UA: NEGATIVE
Leukocytes,UA: NEGATIVE
Nitrite, UA: NEGATIVE
Protein,UA: NEGATIVE
RBC, UA: NEGATIVE
Specific Gravity, UA: 1.015 (ref 1.005–1.030)
Urobilinogen, Ur: 0.2 mg/dL (ref 0.2–1.0)
pH, UA: 6 (ref 5.0–7.5)

## 2023-11-02 LAB — MICROSCOPIC EXAMINATION
Bacteria, UA: NONE SEEN
WBC, UA: NONE SEEN /HPF (ref 0–5)

## 2023-11-02 MED ORDER — CIPROFLOXACIN HCL 500 MG PO TABS
500.0000 mg | ORAL_TABLET | Freq: Two times a day (BID) | ORAL | 0 refills | Status: AC
Start: 2023-11-02 — End: 2023-11-07

## 2023-11-02 NOTE — Progress Notes (Signed)
 BP 122/70 (BP Location: Left Arm, Patient Position: Sitting, Cuff Size: Large)   Temp 98.8 F (37.1 C) (Oral)   Resp 15   Ht 5' 5.98" (1.676 m)   Wt 179 lb (81.2 kg)   SpO2 97%   BMI 28.91 kg/m    Subjective:    Patient ID: Dale Morton, male    DOB: [birth year], 73 y.o.   MRN: [id number]  HPI: Dale Morton is a 73 y.o. male  Chief Complaint  Patient presents with   Rash    Very red and irritated. Urination has been painful. Started on May 08. No new partners.    Patient states he is having rash in the genital area.  He states it is very painful with urination.  It started on May 8.  He feels like it is itching around the tip.  He is taking Farxiga .  He is not circumcised. No discharge from his penis.  No new sexual partners.   Relevant past medical, surgical, family and social history reviewed and updated as indicated. Interim medical history since our last visit reviewed. Allergies and medications reviewed and updated.  Review of Systems  Genitourinary:  Positive for dysuria. Negative for penile discharge.    Per HPI unless specifically indicated above     Objective:     BP 122/70 (BP Location: Left Arm, Patient Position: Sitting, Cuff Size: Large)   Temp 98.8 F (37.1 C) (Oral)   Resp 15   Ht 5' 5.98" (1.676 m)   Wt 179 lb (81.2 kg)   SpO2 97%   BMI 28.91 kg/m   Wt Readings from Last 3 Encounters:  11/02/23 179 lb (81.2 kg)  10/11/23 189 lb 6.4 oz (85.9 kg)  05/31/23 196 lb 3.2 oz (89 kg)    Physical Exam Vitals and nursing note reviewed.  Constitutional:      General: He is not in acute distress.    Appearance: Normal appearance. He is not ill-appearing, toxic-appearing or diaphoretic.  HENT:     Head: Normocephalic.     Right Ear: External ear normal.     Left Ear: External ear normal.     Nose: Nose normal. No congestion or rhinorrhea.     Mouth/Throat:     Mouth: Mucous membranes are moist.  Eyes:     General:        Right eye: No discharge.         Left eye: No discharge.     Extraocular Movements: Extraocular movements intact.     Conjunctiva/sclera: Conjunctivae normal.     Pupils: Pupils are equal, round, and reactive to light.  Cardiovascular:     Rate and Rhythm: Normal rate and regular rhythm.     Heart sounds: No murmur heard. Pulmonary:     Effort: Pulmonary effort is normal. No respiratory distress.     Breath sounds: Normal breath sounds. No wheezing, rhonchi or rales.  Abdominal:     General: Abdomen is flat. Bowel sounds are normal.  Musculoskeletal:     Cervical back: Normal range of motion and neck supple.  Skin:    General: Skin is warm and dry.     Capillary Refill: Capillary refill takes less than 2 seconds.  Neurological:     General: No focal deficit present.     Mental Status: He is alert and oriented to person, place, and time.  Psychiatric:        Mood and Affect: Mood normal.  Behavior: Behavior normal.        Thought Content: Thought content normal.        Judgment: Judgment normal.     Results for orders placed or performed in visit on 10/11/23  Bayer DCA Hb A1c Waived   Collection Time: 10/11/23  9:37 AM  Result Value Ref Range   HB A1C (BAYER DCA - WAIVED) 7.1 (H) 4.8 - 5.6 %  Microalbumin, Urine Waived   Collection Time: 10/11/23  9:37 AM  Result Value Ref Range   Microalb, Ur Waived 80 (H) 0 - 19 mg/L   Creatinine, Urine Waived 200 10 - 300 mg/dL   Microalb/Creat Ratio 30-300 (H) <30 mg/g  Comprehensive metabolic panel with GFR   Collection Time: 10/11/23  9:37 AM  Result Value Ref Range   Glucose 110 (H) 70 - 99 mg/dL   BUN 14 8 - 27 mg/dL   Creatinine, Ser 6.96 (L) 0.76 - 1.27 mg/dL   eGFR 295 >28 UX/LKG/4.01   BUN/Creatinine Ratio 21 10 - 24   Sodium 137 134 - 144 mmol/L   Potassium 4.1 3.5 - 5.2 mmol/L   Chloride 100 96 - 106 mmol/L   CO2 19 (L) 20 - 29 mmol/L   Calcium  9.7 8.6 - 10.2 mg/dL   Total Protein 8.2 6.0 - 8.5 g/dL   Albumin 5.1 (H) 3.8 - 4.8 g/dL    Globulin, Total 3.1 1.5 - 4.5 g/dL   Bilirubin Total 0.4 0.0 - 1.2 mg/dL   Alkaline Phosphatase 148 (H) 44 - 121 IU/L   AST 23 0 - 40 IU/L   ALT 24 0 - 44 IU/L  Lipid Panel w/o Chol/HDL Ratio   Collection Time: 10/11/23  9:37 AM  Result Value Ref Range   Cholesterol, Total 245 (H) 100 - 199 mg/dL   Triglycerides 027 (H) 0 - 149 mg/dL   HDL 45 >25 mg/dL   VLDL Cholesterol Cal 45 (H) 5 - 40 mg/dL   LDL Chol Calc (NIH) 366 (H) 0 - 99 mg/dL  TSH   Collection Time: 10/11/23  9:37 AM  Result Value Ref Range   TSH 2.540 0.450 - 4.500 uIU/mL      Assessment & Plan:   Problem List Items Addressed This Visit   None Visit Diagnoses       Acute cystitis without hematuria    -  Primary   2+ glucose on exam.  Suspect symptoms are due to Farxiga .  Stop the medication x 2 weeks then resume. Will also treat with Cipro  and send for culture.   Relevant Orders   Urine Culture     Dysuria       Relevant Orders   Urinalysis, Routine w reflex microscopic     Encounter to discuss test results       Reviewed test results from previous visit with patient today.        Follow up plan: No follow-ups on file.   A total of 30 minutes were spent on this encounter today.  When total time is documented, this includes both the face-to-face and non-face-to-face time personally spent before, during and after the visit on the date of the encounter reviewing labs from previous visit, results, symptoms, plan of care and follow up.

## 2023-11-03 ENCOUNTER — Ambulatory Visit: Payer: Self-pay | Admitting: Nurse Practitioner

## 2023-11-04 LAB — URINE CULTURE: Organism ID, Bacteria: NO GROWTH

## 2023-11-16 ENCOUNTER — Encounter: Payer: Self-pay | Admitting: Nurse Practitioner

## 2023-11-16 ENCOUNTER — Ambulatory Visit (INDEPENDENT_AMBULATORY_CARE_PROVIDER_SITE_OTHER): Admitting: Nurse Practitioner

## 2023-11-16 ENCOUNTER — Ambulatory Visit: Payer: Self-pay | Admitting: Nurse Practitioner

## 2023-11-16 VITALS — BP 119/66 | HR 73 | Temp 99.0°F | Ht 66.0 in | Wt 178.4 lb

## 2023-11-16 DIAGNOSIS — N3000 Acute cystitis without hematuria: Secondary | ICD-10-CM | POA: Diagnosis not present

## 2023-11-16 DIAGNOSIS — G8929 Other chronic pain: Secondary | ICD-10-CM | POA: Diagnosis not present

## 2023-11-16 DIAGNOSIS — M5441 Lumbago with sciatica, right side: Secondary | ICD-10-CM | POA: Diagnosis not present

## 2023-11-16 DIAGNOSIS — R6889 Other general symptoms and signs: Secondary | ICD-10-CM | POA: Insufficient documentation

## 2023-11-16 DIAGNOSIS — M5442 Lumbago with sciatica, left side: Secondary | ICD-10-CM | POA: Diagnosis not present

## 2023-11-16 LAB — URINALYSIS, ROUTINE W REFLEX MICROSCOPIC
Bilirubin, UA: NEGATIVE
Glucose, UA: NEGATIVE
Ketones, UA: NEGATIVE
Leukocytes,UA: NEGATIVE
Nitrite, UA: NEGATIVE
Protein,UA: NEGATIVE
RBC, UA: NEGATIVE
Specific Gravity, UA: 1.01 (ref 1.005–1.030)
Urobilinogen, Ur: 0.2 mg/dL (ref 0.2–1.0)
pH, UA: 7 (ref 5.0–7.5)

## 2023-11-16 NOTE — Progress Notes (Signed)
 Spoke to patient via telephone and informed of results.

## 2023-11-16 NOTE — Assessment & Plan Note (Signed)
 Chronic at baseline due to past work injury. Use NSAID only as needed.   Recommend alternating heat/ice + placing Voltaren gel on back at home.  Continue to rest and avoid lifting until improved.  May benefit from neurosurgery in future.  G[place] shoulders [place]  Referral to PT per request placed last visit and he has not heard from anyone, will check on this.  Worker's Comp # [phone] = Nat Badger (this was his past worker, unsure if current)

## 2023-11-16 NOTE — Assessment & Plan Note (Signed)
 Per patient by insurance visit NP there was concern about left carotid, on exam today both carotids remain without bruit noted. Consider imaging in future if any changes noted on exam.

## 2023-11-16 NOTE — Progress Notes (Signed)
 BP 119/66   Pulse 73   Temp 99 F (37.2 C) (Oral)   Ht 5\' 6"  (1.676 m)   Wt 178 lb 6.4 oz (80.9 kg)   SpO2 97%   BMI 28.79 kg/m    Subjective:    Patient ID: Dale Morton, male    DOB: 06/27/1950, 73 y.o.   MRN: [id number]  HPI: Dale Morton is a 73 y.o. male  Chief Complaint  Patient presents with   Pain    Patient states he has been having ongoing back pain as well as bilateral shoulder pain. States this has been going on for months. States that the pain is the worst when he tries to lift up his arms or move his shoulders.    Diabetes    Patient states that he thinks that his cholesterol and diabetes medications caused him to have issues with his urine a couple weeks ago. States he finished the cipro  he was given and was told to hold his cholesterol and diabetes medications for 2 weeks when starting the cipro . States he has not experienced any symptoms or issues since being off of these medications for the last 2 weeks.    Treated recently for UTI with Cipro  and had to stop Metformin  and Rosuvastatin  while taking.  Urinary symptoms are better at this time. Does not feel that Rosuvastatin  and Metformin  caused symptoms, wants to make sure he can restart and wants urine checked again.  Reports recent insurance visit by Dow Gemma FNP to home and she was concerned about left carotid on auscultation.  BACK PAIN & SHOULDER PAIN Chronic issue for many years, but currently having acute worsening with this. Uses Meloxicam  at home. Occasionally in [place] will get medication for pain. Can get physical therapy in [place] for right shoulder and injections.   HISTORY: Chronic at baseline, since work accident in 33 or 1989.  During accident he was pulling a deck and it got broken, then pulled back during movement + has h/o falling from height during work.  Did go to physical therapy for period of time, which was beneficial -- however has not been able to get back into this due  to workmen's comp not covering.  Last imaging was in October 2023 with degenerative changes to lumbar and thoracic spine.   Duration: chronic Mechanism of injury: work injury Location: bilateral and low back + shoulders both sides Onset: gradual Severity: moderate Quality: dull, aching, and throbbing Frequency: intermittent Radiation: none Aggravating factors: lifting and prolonged sitting Alleviating factors: Meloxicam  and injections Status: fluctuating Treatments attempted: Flexeril , Tizanidine , Meloxicam , Gabapentin  Relief with NSAIDs?: moderate Nighttime pain:  no Paresthesias / decreased sensation:  no Bowel / bladder incontinence:  no Fevers:  no Dysuria / urinary frequency:  no  Relevant past medical, surgical, family and social history reviewed and updated as indicated. Interim medical history since our last visit reviewed. Allergies and medications reviewed and updated.  Review of Systems  Constitutional:  Negative for activity change, diaphoresis, fatigue and fever.  Respiratory:  Negative for cough, chest tightness, shortness of breath and wheezing.   Cardiovascular:  Negative for chest pain, palpitations and leg swelling.  Gastrointestinal: Negative.   Musculoskeletal:  Positive for arthralgias.  Neurological: Negative.   Psychiatric/Behavioral: Negative.     Per HPI unless specifically indicated above     Objective:     BP 119/66   Pulse 73   Temp 99 F (37.2 C) (Oral)   Ht 5\' 6"  (  1.676 m)   Wt 178 lb 6.4 oz (80.9 kg)   SpO2 97%   BMI 28.79 kg/m   Wt Readings from Last 3 Encounters:  11/16/23 178 lb 6.4 oz (80.9 kg)  11/02/23 179 lb (81.2 kg)  10/11/23 189 lb 6.4 oz (85.9 kg)    Physical Exam Vitals and nursing note reviewed.  Constitutional:      General: He is not in acute distress.    Appearance: He is well-developed. He is not ill-appearing.  HENT:     Head: Normocephalic and atraumatic.     Right Ear: Hearing normal. No drainage.     Left  Ear: Hearing normal. No drainage.     Mouth/Throat:     Pharynx: Uvula midline.  Eyes:     General: Lids are normal.        Right eye: No discharge.        Left eye: No discharge.     Conjunctiva/sclera: Conjunctivae normal.     Pupils: Pupils are equal, round, and reactive to light.  Neck:     Thyroid : No thyromegaly.     Vascular: No carotid bruit (no bruits ausculated to either side) or JVD.     Trachea: Trachea normal.  Cardiovascular:     Rate and Rhythm: Normal rate and regular rhythm.     Heart sounds: Normal heart sounds, S1 normal and S2 normal. No murmur heard.    No gallop.  Pulmonary:     Effort: Pulmonary effort is normal. No accessory muscle usage or respiratory distress.     Breath sounds: Normal breath sounds.  Abdominal:     General: Bowel sounds are normal.     Palpations: Abdomen is soft. There is no hepatomegaly or splenomegaly.     Tenderness: There is no abdominal tenderness. There is no right CVA tenderness or left CVA tenderness.  Musculoskeletal:     Right shoulder: Normal.     Left shoulder: Normal.     Cervical back: Normal range of motion and neck supple. No erythema or rigidity. No spinous process tenderness or muscular tenderness. Normal range of motion.     Lumbar back: Tenderness present. No swelling, edema or lacerations. Decreased range of motion.     Right lower leg: No edema.     Left lower leg: No edema.  Skin:    General: Skin is warm and dry.     Capillary Refill: Capillary refill takes less than 2 seconds.  Neurological:     Mental Status: He is alert and oriented to person, place, and time.     Deep Tendon Reflexes: Reflexes are normal and symmetric.  Psychiatric:        Mood and Affect: Mood normal.        Behavior: Behavior normal.        Thought Content: Thought content normal.        Judgment: Judgment normal.    Results for orders placed or performed in visit on 11/02/23  Microscopic Examination   Collection Time: 11/02/23   2:36 PM   Urine  Result Value Ref Range   WBC, UA None seen 0 - 5 /hpf   RBC, Urine 0-2 0 - 2 /hpf   Epithelial Cells (non renal) 0-10 0 - 10 /hpf   Mucus, UA Present (A) Not Estab.   Bacteria, UA None seen None seen/Few  Urinalysis, Routine w reflex microscopic   Collection Time: 11/02/23  2:36 PM  Result Value Ref Range   Specific  Gravity, UA 1.015 1.005 - 1.030   pH, UA 6.0 5.0 - 7.5   Color, UA Yellow Yellow   Appearance Ur Clear Clear   Leukocytes,UA Negative Negative   Protein,UA Negative Negative/Trace   Glucose, UA 2+ (A) Negative   Ketones, UA Negative Negative   RBC, UA Negative Negative   Bilirubin, UA Negative Negative   Urobilinogen, Ur 0.2 0.2 - 1.0 mg/dL   Nitrite, UA Negative Negative   Microscopic Examination See below:   Urine Culture   Collection Time: 11/02/23  3:05 PM   Specimen: Urine   UR  Result Value Ref Range   Urine Culture, Routine Final report    Organism ID, Bacteria No growth       Assessment & Plan:   Problem List Items Addressed This Visit       Other   Low back pain - Primary   Chronic at baseline due to past work injury. Use NSAID only as needed.   Recommend alternating heat/ice + placing Voltaren gel on back at home.  Continue to rest and avoid lifting until improved.  May benefit from neurosurgery in future.  Gets injection to shoulders in [place]  Referral to PT per request placed last visit and he has not heard from anyone, will check on this.  Worker's Comp # [phone] = Nat Badger (this was his past worker, unsure if current)      Abnormal examination   Per patient by insurance visit NP there was concern about left carotid, on exam today both carotids remain without bruit noted. Consider imaging in future if any changes noted on exam.      Other Visit Diagnoses       Acute cystitis without hematuria       Completed treatment, will recheck urine today per request and discussed with him when to restart all of his  other medications.   Relevant Orders   Urinalysis, Routine w reflex microscopic        Follow up plan: Return for as scheduled in November.

## 2023-11-16 NOTE — Patient Instructions (Signed)
 Urinary Tract Infection, Male A urinary tract infection (UTI) is an infection in your urinary tract. The urinary tract is made up of the organs that make, store, and get rid of pee (urine) in your body. These organs include: The kidneys. The ureters. The bladder. The urethra. What are the causes? Most UTIs are caused by germs called bacteria. They may be in or near your genitals. These germs grow and cause swelling in your urinary tract. What increases the risk? You're more likely to get a UTI if: You have a soft tube called a catheter that drains your pee. You can't control when you pee or poop. You have trouble peeing because of: A prostate that's bigger than normal. A kidney stone. A urinary blockage. A nerve condition that affects your bladder. Not getting enough to drink. You're sexually active. You're an older adult. You're also more likely to get a UTI if you have other health problems, such as: Diabetes. A weak immune system. Your immune system is your body's defense system. Sickle cell disease. Injury of the spine. What are the signs or symptoms? Symptoms may include: Needing to pee right away. Peeing small amounts often. Trouble getting the stream started. Pain or burning when you pee. Blood in your pee. Pee that smells bad or odd. Pain in your belly or lower back. You may also: Feel confused. This may be the first symptom in older adults. Vomit. Not feel hungry. Feel tired or easily annoyed. Have a fever or chills. How is this diagnosed? A UTI is diagnosed based on your medical history and an exam. You may also have other tests. These may include: Pee tests. Blood tests. Tests for sexually transmitted infections (STIs). If you've had more than one UTI, you may need to have imaging studies done to find out why you keep getting them. How is this treated? A UTI can be treated by: Taking antibiotics or other medicines. Drinking enough fluid to keep your pee  pale yellow. In rare cases, a UTI can cause a very bad condition called sepsis. Sepsis may be treated in the hospital. Follow these instructions at home: Medicines Take your medicines only as told by your health care provider. If you were given antibiotics, take them as told by your provider. Do not stop taking them even if you start to feel better. General instructions Make sure you: Pee often and fully. Do not hold your pee for a long time. Pee after you have sex. Contact a health care provider if: Your symptoms don't get better after 1-2 days of taking antibiotics. Your symptoms go away and then come back. You have a fever or chills. You vomit or feel like you may vomit. Get help right away if: You have very bad pain in your back or lower belly. You faint. This information is not intended to replace advice given to you by your health care provider. Make sure you discuss any questions you have with your health care provider. Document Revised: 05/05/2023 Document Reviewed: 08/28/2022 Elsevier Patient Education  2025 [employer].

## 2024-01-11 ENCOUNTER — Ambulatory Visit: Admitting: Nurse Practitioner

## 2024-01-23 NOTE — Patient Instructions (Signed)
 Diabetes mellitus y [place] fsica Diabetes Mellitus and Exercise La actividad fsica regular es importante para su salud, especialmente si tiene diabetes mellitus. La actividad fsica no sirve solo para Publishing copy de [place]. Tambin puede ayudar a aumentar la fuerza muscular y la densidad sea y a reducir la Art gallery manager y Development worker, community. Esto puede mejorar el nivel de resistencia y hacer que est en mejor forma y ms flexible. Por qu debo hacer actividad fsica si tengo diabetes? La actividad fsica tiene muchos beneficios para las personas con diabetes. Puede: Ayudar a bajar y Haematologist en la sangre (glucosa) bajo control. Ayudar al cuerpo a responder mejor y a ser ms sensible a la hormona insulina. Reducir la cantidad de insulina que el cuerpo necesita. Disminuir el riesgo de enfermedad cardaca de las siguientes maneras: Reduciendo la cantidad de colesterol "malo" y de triglicridos que tiene en el cuerpo. Aumentando la cantidad de colesterol "bueno" que tiene en el cuerpo. Bajando la presin arterial. Disminuyendo el nivel de glucemia. Cul es mi plan de Dor[place]o [place]? El mdico o un especialista capacitado en el tratamiento de la diabetes (educador certificado en el cuidado de la diabetes) puede ayudarle a Event organiser un plan de actividad. Este plan puede ayudarle a encontrar el tipo de actividad fsica que ms le [place]. Tambin puede indicarle con qu frecuencia hacer actividad fsica y durante cunto [place]. Asegrese de lo siguiente: Haga por lo menos 150 minutos semanales de ejercicios de intensidad media o alta. Podra incluir caminatas rpidas, andar en bicicleta o gimnasia [place] acutica. Hacer ejercicios de elongacin y de fortalecimiento al menos 2 veces por semana. Estos pueden incluir yoga o levantar pesas. Reparta la [employer] en al menos 3 das de la [place]. Haga algn tipo de actividad fsica Management consultant. No deje pasar ms de 2 das seguidos sin hacer algn tipo de  [place]. Evite permanecer inactivo durante ms de 30 minutos seguidos. Tmese descansos frecuentes para caminar o estirarse. Elija actividades que disfrute. Establezca objetivos que sepa que puede cumplir. Comience lentamente y aumente la intensidad del ejercicio con el correr del [place]. Cmo controlo la diabetes durante la actividad fsica?  Controlar su nivel de glucemia Controle su glucemia antes y despus de hacer actividad fsica. Si la glucemia es de 240 mg/dl (21.3 mmol/l) o ms antes de comenzar a [employer] fsica, controle la orina para detectar la presencia de cetonas. Estas son sustancias qumicas producidas por el hgado. Si tiene [employer] orina, no haga ejercicio hasta que la glucemia se normalice. Si la glucemia es de 100 mg/dl (5.6 mmol/l) o menos, tome una colacin que tenga entre 15 y 20 gramos de carbohidratos. Controle su glucemia 15 minutos despus de la colacin para asegurarse de que el nivel est por encima de 100 mg/dl (5.6 mmol/l) antes de comenzar a hacer actividad fsica. El riesgo de glucemia baja (hipoglucemia) aumenta mientras hace actividad fsica y despus de hacerla. Conozca los sntomas de esta afeccin y cmo tratarla. Siga estas indicaciones en su casa: Tenga a mano una colacin con carbohidratos para usarla antes, durante y despus de la actividad fsica. Esto puede ayudar a prevenir o a tratar la hipoglucemia. Evite inyectarse insulina en las zonas del cuerpo que trabajarn durante la actividad fsica. Esto puede incluir: Los brazos, cuando vaya a Leisure centre manager al tenis. Las piernas, cuando est por irse a trotar. Haga un seguimiento de sus hbitos de [place] fsica. Esto puede ayudarlos a usted y a su mdico a observar y Recruitment consultant de  actividad. Escriba los siguientes datos: Qu come antes y despus de Radio producer actividad fsica. Los niveles de glucemia antes y despus de hacer ejercicios. El tipo y la cantidad de actividad fsica que  Biomedical engineer. Hable con el mdico antes de comenzar [place]. Es posible que deba hacer lo siguiente: Asegurarse de que la actividad sea segura para usted. Ajustar la insulina, los otros medicamentos y los alimentos que usted consume. Beba agua mientras hace actividad fsica. Esto puede impedir que pierda demasiada agua (deshidratacin). Tambin puede prevenir problemas causados por tener mucho calor en el cuerpo (golpe de calor). Dnde obtener ms informacin American Diabetes Association (Asociacin Estadounidense de la Diabetes): diabetes.org Association of Diabetes Care & Education Specialists (Asociacin de Especialistas en Atencin y Educacin sobre la Diabetes): diabeteseducator.org Esta informacin no tiene Theme park manager el consejo del mdico. Asegrese de hacerle al mdico cualquier pregunta que tenga. Document Revised: 12/08/2021 Document Reviewed: 12/08/2021 Elsevier Patient Education  2024 [employer].

## 2024-01-27 ENCOUNTER — Ambulatory Visit (INDEPENDENT_AMBULATORY_CARE_PROVIDER_SITE_OTHER): Admitting: Nurse Practitioner

## 2024-01-27 ENCOUNTER — Encounter: Payer: Self-pay | Admitting: Nurse Practitioner

## 2024-01-27 VITALS — BP 122/68 | HR 66 | Temp 98.6°F | Ht 66.0 in | Wt 199.2 lb

## 2024-01-27 DIAGNOSIS — E119 Type 2 diabetes mellitus without complications: Secondary | ICD-10-CM

## 2024-01-27 DIAGNOSIS — M5442 Lumbago with sciatica, left side: Secondary | ICD-10-CM

## 2024-01-27 DIAGNOSIS — Z7984 Long term (current) use of oral hypoglycemic drugs: Secondary | ICD-10-CM | POA: Diagnosis not present

## 2024-01-27 DIAGNOSIS — M5441 Lumbago with sciatica, right side: Secondary | ICD-10-CM

## 2024-01-27 DIAGNOSIS — G8929 Other chronic pain: Secondary | ICD-10-CM

## 2024-01-27 DIAGNOSIS — E1129 Type 2 diabetes mellitus with other diabetic kidney complication: Secondary | ICD-10-CM | POA: Diagnosis not present

## 2024-01-27 DIAGNOSIS — E669 Obesity, unspecified: Secondary | ICD-10-CM | POA: Diagnosis not present

## 2024-01-27 DIAGNOSIS — N400 Enlarged prostate without lower urinary tract symptoms: Secondary | ICD-10-CM

## 2024-01-27 DIAGNOSIS — E1169 Type 2 diabetes mellitus with other specified complication: Secondary | ICD-10-CM | POA: Diagnosis not present

## 2024-01-27 DIAGNOSIS — E66811 Obesity, class 1: Secondary | ICD-10-CM | POA: Diagnosis not present

## 2024-01-27 DIAGNOSIS — R809 Proteinuria, unspecified: Secondary | ICD-10-CM

## 2024-01-27 DIAGNOSIS — E785 Hyperlipidemia, unspecified: Secondary | ICD-10-CM

## 2024-01-27 DIAGNOSIS — K219 Gastro-esophageal reflux disease without esophagitis: Secondary | ICD-10-CM | POA: Diagnosis not present

## 2024-01-27 LAB — BAYER DCA HB A1C WAIVED: HB A1C (BAYER DCA - WAIVED): 7.4 % — ABNORMAL HIGH (ref 4.8–5.6)

## 2024-01-27 NOTE — Progress Notes (Signed)
 BP 122/68   Pulse 66   Temp 98.6 F (37 C) (Oral)   Ht 5' 6 (1.676 m)   Wt 199 lb 3.2 oz (90.4 kg)   SpO2 98%   BMI 32.15 kg/m    Subjective:    Patient ID: Dale Morton, male    DOB: April 04, 1951, 73 y.o.   MRN: [id number]  HPI: Dale Morton is a 73 y.o. male  Chief Complaint  Patient presents with   Diabetes    No recent eye exam per pain   Hyperlipidemia   Gastroesophageal Reflux   Benign Prostatic Hypertrophy   DIABETES Diagnosed August 2022.  A1c 7.1% May.  Taking Metformin  500 MG BID.  Gets assist from [facility]. Had urinary symptoms with Farxiga . Higher doses of Metformin  cause GI issues. Went to [place] 8 days and ate a lot of bread.  Endorses eating ice cream when hot out.  Hypoglycemic episodes:no Polydipsia/polyuria: no Visual disturbance: no Chest pain: no Paresthesias: no Glucose Monitoring: no  Accucheck frequency: Not Checking  Fasting glucose:  Post prandial:  Evening:  Before meals: Taking Insulin?: no  Long acting insulin:  Short acting insulin: Blood Pressure Monitoring: not checking Retinal Examination: Up To Date -- [employer], but not diabetic exam Foot Exam: Up to Date Pneumovax: Up to Date Influenza: Up to Date Aspirin: no  The 10-year ASCVD risk score (Arnett DK, et al., 2019) is: 35.7%   Values used to calculate the score:     Age: 48 years     Clincally relevant sex: Male     Is Non-Hispanic African American: No     Diabetic: Yes     Tobacco smoker: No     Systolic Blood Pressure: 122 mmHg     Is BP treated: No     HDL Cholesterol: 45 mg/dL     Total Cholesterol: 245 mg/dL  HYPERTENSION / HYPERLIPIDEMIA Takes Rosuvastatin  and Lisinopril . Continues to take Omeprazole  for GERD. Satisfied with current treatment? yes Duration of hypertension: chronic BP monitoring frequency: not checking BP range:  BP medication side effects: no Duration of hyperlipidemia: chronic Cholesterol medication side effects:  no Cholesterol supplements: none Medication compliance: good compliance Aspirin: no Recent stressors: no Recurrent headaches: no Visual changes: no Palpitations: no Dyspnea: no Chest pain: no Lower extremity edema: no Dizzy/lightheaded: no   BACK & SHOULDER PAIN  Chronic issue for multiple years. Meloxicam  and Gabapentin  taken at home. Gets medication in [place] at times when travels there. Has also had PT and injections there in past. Has not heard from PT to schedule, referral placed in May.  HISTORY: Chronic at baseline, since work accident in 25 or 1989.  During accident he was pulling a deck and it got broken, then pulled back during movement + has h/o falling from height during work.  Did go to physical therapy for period of time, which was beneficial -- however has not been able to get back into this due to workmen's comp not covering.  Last imaging was in October 2023 with degenerative changes to lumbar and thoracic spine.   Duration: months Mechanism of injury: work accident - chronic issue Location: low back and right shoulder Onset: chronic Severity: 9/10 at worst (stiff when sitting for too long) and at lowest 2/10  Quality: dull/burning across lower back, dull/aching shoulder Frequency: intermittent Radiation: none Aggravating factors: lifting and prolonged sitting Alleviating factors: Meloxicam  and injections Status: fluctuating Treatments attempted: Flexeril , Tizanidine , Meloxicam , Gabapentin  Relief with  NSAIDs?: moderate Nighttime pain:  no Paresthesias / decreased sensation:  no Bowel / bladder incontinence:  no Fevers:  no Dysuria / urinary frequency:  no  Relevant past medical, surgical, family and social history reviewed and updated as indicated. Interim medical history since our last visit reviewed. Allergies and medications reviewed and updated.  Review of Systems  Constitutional:  Negative for activity change, diaphoresis, fatigue and fever.   Respiratory:  Negative for cough, chest tightness, shortness of breath and wheezing.   Cardiovascular:  Negative for chest pain, palpitations and leg swelling.  Gastrointestinal: Negative.   Musculoskeletal:  Positive for arthralgias.  Neurological: Negative.   Psychiatric/Behavioral: Negative.      Per HPI unless specifically indicated above     Objective:    BP 122/68   Pulse 66   Temp 98.6 F (37 C) (Oral)   Ht 5' 6 (1.676 m)   Wt 199 lb 3.2 oz (90.4 kg)   SpO2 98%   BMI 32.15 kg/m   Wt Readings from Last 3 Encounters:  01/27/24 199 lb 3.2 oz (90.4 kg)  11/16/23 178 lb 6.4 oz (80.9 kg)  11/02/23 179 lb (81.2 kg)    Physical Exam Vitals and nursing note reviewed.  Constitutional:      General: He is not in acute distress.    Appearance: He is well-developed. He is not ill-appearing.  HENT:     Head: Normocephalic and atraumatic.     Right Ear: Hearing normal. No drainage.     Left Ear: Hearing normal. No drainage.     Mouth/Throat:     Pharynx: Uvula midline.  Eyes:     General: Lids are normal.        Right eye: No discharge.        Left eye: No discharge.     Conjunctiva/sclera: Conjunctivae normal.     Pupils: Pupils are equal, round, and reactive to light.  Neck:     Thyroid : No thyromegaly.     Vascular: No carotid bruit or JVD.     Trachea: Trachea normal.  Cardiovascular:     Rate and Rhythm: Normal rate and regular rhythm.     Heart sounds: Normal heart sounds, S1 normal and S2 normal. No murmur heard.    No gallop.  Pulmonary:     Effort: Pulmonary effort is normal. No accessory muscle usage or respiratory distress.     Breath sounds: Normal breath sounds.  Abdominal:     General: Bowel sounds are normal.     Palpations: Abdomen is soft. There is no hepatomegaly or splenomegaly.  Musculoskeletal:     Right shoulder: Normal.     Left shoulder: Normal.     Cervical back: Normal range of motion and neck supple. No erythema or rigidity. No  spinous process tenderness or muscular tenderness. Normal range of motion.     Lumbar back: Tenderness present. No swelling, edema or lacerations. Decreased range of motion.     Right lower leg: No edema.     Left lower leg: No edema.  Skin:    General: Skin is warm and dry.     Capillary Refill: Capillary refill takes less than 2 seconds.  Neurological:     Mental Status: He is alert and oriented to person, place, and time.     Deep Tendon Reflexes: Reflexes are normal and symmetric.  Psychiatric:        Mood and Affect: Mood normal.        Behavior:  Behavior normal.        Thought Content: Thought content normal.        Judgment: Judgment normal.     Results for orders placed or performed in visit on 11/16/23  Urinalysis, Routine w reflex microscopic   Collection Time: 11/16/23  3:11 PM  Result Value Ref Range   Specific Gravity, UA 1.010 1.005 - 1.030   pH, UA 7.0 5.0 - 7.5   Color, UA Yellow Yellow   Appearance Ur Clear Clear   Leukocytes,UA Negative Negative   Protein,UA Negative Negative/Trace   Glucose, UA Negative Negative   Ketones, UA Negative Negative   RBC, UA Negative Negative   Bilirubin, UA Negative Negative   Urobilinogen, Ur 0.2 0.2 - 1.0 mg/dL   Nitrite, UA Negative Negative   Microscopic Examination Comment       Assessment & Plan:   Problem List Items Addressed This Visit       Digestive   Gastroesophageal reflux disease without esophagitis   Chronic with worsening.  Continue PPI for now.  Recommend heavy focus on diet changes at home.  Mag level annually.  Risks of PPI use were discussed with patient including bone loss, C. Diff diarrhea, pneumonia, infections, CKD, electrolyte abnormalities.  Verbalizes understanding and chooses to continue the medication.         Relevant Orders   Magnesium     Endocrine   Type 2 diabetes mellitus with proteinuria (HCC)   Diagnosed in August 2022, A1c 7.4% today, slight trend up, and urine ALB 80 (April  2025). Continue Metformin  at current dose, cannot take higher due to GI issues.  Can not take Farxiga  due to urinary symptoms.  Recommend he check BS at home a few mornings a week with goal <130.  - Continue to work with [facility] - Statin remains on board + ACE, recommend he take consistently. - Eye and foot exams up to date - Vaccinations up to date      Relevant Orders   Bayer DCA Hb A1c Waived   CBC with Differential/Platelet   Type 2 diabetes mellitus with obesity (HCC) - Primary   Diagnosed in August 2022, A1c 7.4% today, slight trend up, and urine ALB 80 (April 2025). Continue Metformin  at current dose, cannot take higher due to GI issues.  Can not take Farxiga  due to urinary symptoms.  Recommend he check BS at home a few mornings a week with goal <130.  - Continue to work with [facility] Pharm[facility] - Statin remains on board + ACE, recommend he take consistently. - Eye and foot exams up to date - Vaccinations up to date      Relevant Orders   Bayer DCA Hb A1c Waived   Hyperlipidemia associated with type 2 diabetes mellitus (HCC)   Chronic, ongoing.  Continue current medication regimen and adjust as needed. Lipid panel today.       Relevant Orders   Bayer DCA Hb A1c Waived   Comprehensive metabolic panel with GFR   Lipid Panel w/o Chol/HDL Ratio   Diabetes mellitus treated with oral medication (HCC)   Refer to diabetes with proteinuria plan of care.      Relevant Orders   Bayer DCA Hb A1c Waived     Genitourinary   Benign prostatic hyperplasia without lower urinary tract symptoms   No current symptoms, check PSA on labs today.      Relevant Orders   PSA     Other   Obesity  BMI 32.15.  Recommended eating smaller high protein, low fat meals more frequently and exercising 30 mins a day 5 times a week with a goal of 10-15lb weight loss in the next 3 months. Patient voiced their understanding and motivation to adhere to these recommendations.       Low back pain    Chronic at baseline due to past work injury. Use NSAID only as needed.   Recommend alternating heat/ice + placing Voltaren gel on back at home.  Continue to rest and avoid lifting until improved.  May benefit from neurosurgery in future.  Gets injection to shoulders in [place]  Referral to PT placed last visit, will check on this as has not heard from anyone to schedule.  Worker's Comp # [phone] = Sheree Phalen (this was his past worker, unsure if current)      Relevant Medications   tiZANidine  (ZANAFLEX ) 4 MG capsule     Follow up plan: Return in about 8 months (around 09/18/2024) for T2DM, HTN/HLD -- he will schedule once returns from [place] in April (leaving for 5 months).

## 2024-01-27 NOTE — Assessment & Plan Note (Signed)
 Chronic at baseline due to past work injury. Use NSAID only as needed.   Recommend alternating heat/ice + placing Voltaren gel on back at home.  Continue to rest and avoid lifting until improved.  May benefit from neurosurgery in future.  Gets injection to shoulders in [place]  Referral to PT placed last visit, will check on this as has not heard from anyone to schedule.  Worker's Comp # [phone] = Sheree Phalen (this was his past worker, unsure if current)

## 2024-01-27 NOTE — Assessment & Plan Note (Signed)
 Refer to diabetes with proteinuria plan of care.

## 2024-01-27 NOTE — Assessment & Plan Note (Signed)
 Diagnosed in August 2022, A1c 7.4% today, slight trend up, and urine ALB 80 (April 2025). Continue Metformin  at current dose, cannot take higher due to GI issues.  Can not take Farxiga  due to urinary symptoms.  Recommend he check BS at home a few mornings a week with goal <130.  - Continue to work with [facility] - Statin remains on board + ACE, recommend he take consistently. - Eye and foot exams up to date - Vaccinations up to date

## 2024-01-27 NOTE — Assessment & Plan Note (Signed)
No current symptoms, check PSA on labs today.

## 2024-01-27 NOTE — Assessment & Plan Note (Signed)
BMI 32.15.  Recommended eating smaller high protein, low fat meals more frequently and exercising 30 mins a day 5 times a week with a goal of 10-15lb weight loss in the next 3 months. Patient voiced their understanding and motivation to adhere to these recommendations. ? ?

## 2024-01-27 NOTE — Assessment & Plan Note (Addendum)
 Chronic with worsening.  Continue PPI for now.  Recommend heavy focus on diet changes at home.  Mag level annually.  Risks of PPI use were discussed with patient including bone loss, C. Diff diarrhea, pneumonia, infections, CKD, electrolyte abnormalities.  Verbalizes understanding and chooses to continue the medication.

## 2024-01-27 NOTE — Assessment & Plan Note (Signed)
 Chronic, ongoing.  Continue current medication regimen and adjust as needed. Lipid panel today.

## 2024-01-28 ENCOUNTER — Ambulatory Visit: Payer: Self-pay | Admitting: Nurse Practitioner

## 2024-01-28 LAB — CBC WITH DIFFERENTIAL/PLATELET
Basophils Absolute: 0.1 x10E3/uL (ref 0.0–0.2)
Basos: 1 %
EOS (ABSOLUTE): 1.1 x10E3/uL — ABNORMAL HIGH (ref 0.0–0.4)
Eos: 15 %
Hematocrit: 45.3 % (ref 37.5–51.0)
Hemoglobin: 14.9 g/dL (ref 13.0–17.7)
Immature Grans (Abs): 0 x10E3/uL (ref 0.0–0.1)
Immature Granulocytes: 0 %
Lymphocytes Absolute: 2.3 x10E3/uL (ref 0.7–3.1)
Lymphs: 32 %
MCH: 29.9 pg (ref 26.6–33.0)
MCHC: 32.9 g/dL (ref 31.5–35.7)
MCV: 91 fL (ref 79–97)
Monocytes Absolute: 0.5 x10E3/uL (ref 0.1–0.9)
Monocytes: 7 %
Neutrophils Absolute: 3.2 x10E3/uL (ref 1.4–7.0)
Neutrophils: 45 %
Platelets: 220 x10E3/uL (ref 150–450)
RBC: 4.99 x10E6/uL (ref 4.14–5.80)
RDW: 12.8 % (ref 11.6–15.4)
WBC: 7.2 x10E3/uL (ref 3.4–10.8)

## 2024-01-28 LAB — COMPREHENSIVE METABOLIC PANEL WITH GFR
ALT: 25 IU/L (ref 0–44)
AST: 22 IU/L (ref 0–40)
Albumin: 4.7 g/dL (ref 3.8–4.8)
Alkaline Phosphatase: 143 IU/L — ABNORMAL HIGH (ref 44–121)
BUN/Creatinine Ratio: 14 (ref 10–24)
BUN: 11 mg/dL (ref 8–27)
Bilirubin Total: 0.4 mg/dL (ref 0.0–1.2)
CO2: 21 mmol/L (ref 20–29)
Calcium: 9.4 mg/dL (ref 8.6–10.2)
Chloride: 99 mmol/L (ref 96–106)
Creatinine, Ser: 0.77 mg/dL (ref 0.76–1.27)
Globulin, Total: 3 g/dL (ref 1.5–4.5)
Glucose: 119 mg/dL — ABNORMAL HIGH (ref 70–99)
Potassium: 4 mmol/L (ref 3.5–5.2)
Sodium: 134 mmol/L (ref 134–144)
Total Protein: 7.7 g/dL (ref 6.0–8.5)
eGFR: 96 mL/min/1.73 (ref >59)

## 2024-01-28 LAB — PSA: Prostate Specific Ag, Serum: 1.5 ng/mL (ref 0.0–4.0)

## 2024-01-28 LAB — LIPID PANEL W/O CHOL/HDL RATIO
Cholesterol, Total: 222 mg/dL — ABNORMAL HIGH (ref 100–199)
HDL: 35 mg/dL — ABNORMAL LOW (ref >39)
LDL Chol Calc (NIH): 127 mg/dL — ABNORMAL HIGH (ref 0–99)
Triglycerides: 336 mg/dL — ABNORMAL HIGH (ref 0–149)
VLDL Cholesterol Cal: 60 mg/dL — ABNORMAL HIGH (ref 5–40)

## 2024-01-28 LAB — MAGNESIUM: Magnesium: 1.9 mg/dL (ref 1.6–2.3)

## 2024-01-28 NOTE — Progress Notes (Signed)
 Please let Dale Morton know his labs have returned and overall are stable with exception of cholesterol level and LDL, bad cholesterol.  Please ensure you are taking your Rosuvastatin  daily to help lower levels and prevent stroke.  If ongoing elevations next visit we may need to add on a second medication for this.  Any questions? Keep being awesome!!  Thank you for allowing me to participate in your care.  I appreciate you. Kindest regards, Jayzon Taras

## 2024-01-31 DIAGNOSIS — H524 Presbyopia: Secondary | ICD-10-CM | POA: Diagnosis not present

## 2024-01-31 DIAGNOSIS — E119 Type 2 diabetes mellitus without complications: Secondary | ICD-10-CM | POA: Diagnosis not present

## 2024-01-31 LAB — HM DIABETES EYE EXAM

## 2024-02-28 ENCOUNTER — Other Ambulatory Visit: Payer: Self-pay | Admitting: Nurse Practitioner

## 2024-02-28 ENCOUNTER — Telehealth: Payer: Self-pay

## 2024-02-28 NOTE — Telephone Encounter (Unsigned)
 Copied from CRM [id number]. Topic: Referral - Question >> Feb 28, 2024 12:04 PM Sophia H wrote: Reason for CRM: Spoke with daughter Maryhelen who states they have not heard anything back on the referral for physical therapy. States when they've called that clinic they are told they are still waiting on a referral from clinic. Looks like referral in chart for PT expired 12/25/2023. Please advise/send new referral.

## 2024-02-28 NOTE — Telephone Encounter (Signed)
 Copied from CRM [id number]. Topic: Clinical - Medication Refill >> Feb 28, 2024 12:06 PM Sophia H wrote: Medication: rosuvastatin  (CRESTOR ) 40 MG tablet  Has the patient contacted their pharmacy? Yes, need to contact pcp for fill.   This is the patient's preferred pharmacy:  [id number] - [place], [place] - [address] [place] [address] Phone: [phone] Fax: [phone]  Is this the correct pharmacy for this prescription? Yes If no, delete pharmacy and type the correct one.   Has the prescription been filled recently? Yes  Is the patient out of the medication? Yes  Has the patient been seen for an appointment in the last year OR does the patient have an upcoming appointment? Yes, seen 08/21.  Can we respond through MyChart? No, prefers phone call.  Agent: Please be advised that Rx refills may take up to 3 business days. We ask that you follow-up with your pharmacy.

## 2024-02-29 MED ORDER — ROSUVASTATIN CALCIUM 40 MG PO TABS: 40.0000 mg | ORAL_TABLET | Freq: Every day | ORAL | 4 refills | Status: AC

## 2024-02-29 NOTE — Telephone Encounter (Signed)
 Routed for assistance back to E2C2 to review already completed and active referral for PT to [facility].

## 2024-02-29 NOTE — Telephone Encounter (Signed)
 Requested medications are due for refill today.  yes  Requested medications are on the active medications list.  yes  Last refill. 08/10/2023  Future visit scheduled.   yes  Notes to clinic.  Medication is historical.    Requested Prescriptions  Pending Prescriptions Disp Refills   rosuvastatin  (CRESTOR ) 40 MG tablet      Sig: Take 1 tablet (40 mg total) by mouth daily.     Cardiovascular:  Antilipid - Statins 2 Failed - 02/29/2024 12:54 PM      Failed - Lipid Panel in normal range within the last 12 months    Cholesterol, Total  Date Value Ref Range Status  01/27/2024 222 (H) 100 - 199 mg/dL Final   LDL Chol Calc (NIH)  Date Value Ref Range Status  01/27/2024 127 (H) 0 - 99 mg/dL Final   HDL  Date Value Ref Range Status  01/27/2024 35 (L) >39 mg/dL Final   Triglycerides  Date Value Ref Range Status  01/27/2024 336 (H) 0 - 149 mg/dL Final         Passed - Cr in normal range and within 360 days    Creatinine, Ser  Date Value Ref Range Status  01/27/2024 0.77 0.76 - 1.27 mg/dL Final         Passed - Patient is not pregnant      Passed - Valid encounter within last 12 months    Recent Outpatient Visits           1 month ago Type 2 diabetes mellitus with obesity   [place] [facility] [place], [place] T, NP   3 months ago Chronic bilateral low back pain with bilateral sciatica   [place] [facility] [place], [place] T, NP   3 months ago Acute cystitis without hematuria   [place] [facility] Melvin Pao, NP   4 months ago Type 2 diabetes mellitus with obesity   [place] [facility] [place], Melanie DASEN, NP

## 2024-03-02 ENCOUNTER — Ambulatory Visit

## 2024-03-02 VITALS — Ht 62.21 in | Wt 196.0 lb

## 2024-03-02 DIAGNOSIS — Z0000 Encounter for general adult medical examination without abnormal findings: Secondary | ICD-10-CM

## 2024-03-02 NOTE — Patient Instructions (Signed)
 Mr. Dale Morton,  Thank you for taking the time for your Medicare Wellness Visit. I appreciate your continued commitment to your health goals. Please review the care plan we discussed, and feel free to reach out if I can assist you further.  Medicare recommends these wellness visits once per year to help you and your care team stay ahead of potential health issues. These visits are designed to focus on prevention, allowing your provider to concentrate on managing your acute and chronic conditions during your regular appointments.  Please note that Annual Wellness Visits do not include a physical exam. Some assessments may be limited, especially if the visit was conducted virtually. If needed, we may recommend a separate in-person follow-up with your provider.  Ongoing Care Seeing your primary care provider every 3 to 6 months helps us  monitor your health and provide consistent, personalized care.   Referrals If a referral was made during today's visit and you haven't received any updates within two weeks, please contact the referred provider directly to check on the status.  Recommended Screenings: Get the flu vaccine at your convenience.  Health Maintenance  Topic Date Due   Eye exam for diabetics  12/23/2022   Flu Shot  01/07/2024   COVID-19 Vaccine (3 - 2025-26 season) 02/07/2024   Hemoglobin A1C  07/29/2024   Yearly kidney health urinalysis for diabetes  10/10/2024   Complete foot exam   11/15/2024   Yearly kidney function blood test for diabetes  01/26/2025   Medicare Annual Wellness Visit  03/02/2025   Colon Cancer Screening  06/09/2027   DTaP/Tdap/Td vaccine (4 - Td or Tdap) 12/24/2032   Pneumococcal Vaccine for age over 48  Completed   Hepatitis C Screening  Completed   Zoster (Shingles) Vaccine  Completed   HPV Vaccine  Aged Out   Meningitis B Vaccine  Aged Out       03/02/2024   10:29 AM  Advanced Directives  Does Patient Have a Medical Advance Directive? No  Would patient  like information on creating a medical advance directive? Yes (MAU/Ambulatory/Procedural Areas - Information given)   Advance Care Planning is important because it: Ensures you receive medical care that aligns with your values, goals, and preferences. Provides guidance to your family and loved ones, reducing the emotional burden of decision-making during critical moments.  Vision: Annual vision screenings are recommended for early detection of glaucoma, cataracts, and diabetic retinopathy. These exams can also reveal signs of chronic conditions such as diabetes and high blood pressure.  Dental: Annual dental screenings help detect early signs of oral cancer, gum disease, and other conditions linked to overall health, including heart disease and diabetes.  Please see the attached documents for additional preventive care recommendations.   Prevencin de cadas en el hogar, en adultos Fall Prevention in the Home, Adult Las cadas pueden causar lesiones y Audiological scientist a Dealer de todas las edades. Hay muchas cosas simples que puede hacer para que su casa sea un lugar seguro y ayudar a prevenir las cadas. Pida ayuda cuando realice estos cambios, si la necesita. Qu medidas puedo tomar para prevenir cadas? Informacin general Use una buena iluminacin en todos los [place]. Asegrese de hacer lo siguiente: Reemplace las bombillas que se hayan quemado. Encienda las luces si est oscuro y use luces nocturnas. Mantenga los objetos que usa  con frecuencia en lugares de fcil acceso. Baje los estantes de toda la casa de ser necesario. Disponga los muebles de modo tal que haya espacio para caminar a su  alrededor. No tenga alfombras ni otros objetos en el piso que puedan hacerlo tropezar. Si los pisos estn desparejos, reprelos. Agregue pintura o cinta de color o contraste para Cabin crew con claridad y poder ver: Las barras para sostn o los pasamanos. El Engineer, maintenance y el ltimo escaln de las  escaleras. Dnde est el borde de cada escaln. Si usa  una escalera o una escalera de mano: Asegrese de que est abierta por completo. No suba a una escalera cerrada. Asegrese de que ambos lados de la escalera estn asegurados en su lugar. Pdale a alguien que sostenga la escalera mientras usted la est [place]. Sepa dnde estn sus mascotas cuando se desplace por su casa. Qu puedo hacer en el bao?     Mantenga el piso seco. Limpie de inmediato el agua que est en piso. Elimine la acumulacin de jabn en la baera o la ducha. La acumulacin hace que las baeras y duchas sean resbaladizas. Utilice alfombras o pegatinas antideslizantes en el piso de la baera o la ducha. Asegure las alfombras del bao con una cinta antideslizante doble faz para alfombras. Si necesita sentarse mientras se ducha, use un banco antideslizante. Instale barras para sostn al lado del inodoro y en la baera y la ducha. No use los toalleros como barras para sostn. Qu puedo hacer en el dormitorio? Asegrese de tener una luz junto a la cama que sea fcil de [place]ista. No use sbanas ni mantas para la cama que caigan al piso. Tenga un banco o una silla firme con brazos laterales que pueda usar como apoyo cuando se vista. Qu puedo hacer en la cocina? Limpie de inmediato cualquier derrame. Si necesita alcanzar algo que est alto, use un banco escalera firme con una barra para sostn. Mantenga los cables elctricos fuera del camino. No use un pulidor o cera para pisos que dejen los pisos resbaladizos. Qu puedo hacer con las escaleras? No deje nada en las escaleras. Asegrese de tener un interruptor de luz en la parte superior e inferior de las escaleras. Si no lo tiene, instale uno. Asegrese de que haya pasamanos en ambos lados de las escaleras. Repare los pasamanos que estn flojos o rotos. Asegrese de que los pasamanos tengan la misma longitud que las escaleras. Instale peldaos antideslizantes en todas  las escaleras de su casa si no tienen alfombra. Evite colocar alfombras en la parte superior o inferior de las escaleras, o asegure las alfombras con cinta adhesiva para alfombras a fin de evitar que se muevan. Para las escaleras, elija un diseo de alfombra que no oculte el borde de los [employer]. Asegrese de que las alfombras estn bien adheridas a las escaleras. Arregle las alfombras flojas o gastadas. Qu puedo hacer en el exterior de mi casa? Use una iluminacin brillante en el exterior. Repare peridicamente los bordes de los pasillos y las [place], as como las grietas. Retire los [employer] que estn en el camino y que puedan hacer que se tropiece, como herramientas o piedras. Agregue pintura o cinta de color o contraste para Cabin crew con claridad y poder ver los umbrales en las puertas que sean altos. Pode los arbustos o los rboles que se encuentren en el sendero principal que lleva a su casa. Verifique que los pasamanos estn bien ajustados y en buen [place]. Ambos lados de los escalones deben tener pasamanos. Instale barandillas de proteccin en los bordes de las terrazas o galeras elevadas. Limpie regularmente las hojas, la nieve y el hielo. Use arena, sal o un fundidor de hielo en  los senderos si vive donde hay hielo y [employer] meses de invierno. En el garaje, limpie de inmediato cualquier derrame, incluidos los derrames de grasa y aceite. Qu otras medidas puedo tomar? Revise los medicamentos con el mdico. Algunos medicamentos pueden causarle confusin o mareo. Esto puede aumentar el riesgo de caerse. Use calzado cerrado en los dedos que le calce bien y le amortige los pies. Use calzado con suela de goma y taco bajo. Use un bastn, un andador, un escter o muletas que lo ayuden a moverse si es necesario. Hable con el mdico sobre [address] de reducir el riesgo de cadas. Esto puede incluir ver a un fisioterapeuta con el fin de aprender a hacer ejercicios para mejorar el  movimiento y fortalecerse. Dnde buscar ms informacin Centers for Disease Control and Prevention (Centros para el Control y la Prevencin de [place]), STEADI: TonerPromos.no [employer] on Aging (Instituto Nacional sobre el Envejecimiento): BaseRingTones.pl National Institute on Aging (Instituto Nacional sobre el Envejecimiento): BaseRingTones.pl Comunquese con un mdico si: Tiene miedo de caerse en su casa. Se siente dbil, somnoliento o mareado en su casa. Se cae en su casa. Solicite ayuda de inmediato si: Pierde la conciencia o tiene problemas para moverse luego de Engineer, site. Tiene una cada que causa una lesin en la cabeza. Estos sntomas pueden Customer service manager. Solicite ayuda de inmediato. Llame al 911. No espere a ver si los sntomas desaparecen. No conduzca por sus propios medios [employer]. Esta informacin no tiene Theme park manager el consejo del mdico. Asegrese de hacerle al mdico cualquier pregunta que tenga. Document Revised: 02/25/2022 Document Reviewed: 02/25/2022 Elsevier Patient Education  2024 [employer].

## 2024-03-02 NOTE — Progress Notes (Signed)
 Subjective:   Dale Morton is a 73 y.o. who presents for a Medicare Wellness preventive visit.  As a reminder, Annual Wellness Visits don't include a physical exam, and some assessments may be limited, especially if this visit is performed virtually. We may recommend an in-person follow-up visit with your provider if needed.  Visit Complete: Virtual I connected with  Dale Morton on 03/02/24 by a audio enabled telemedicine application and verified that I am speaking with the correct person using two identifiers.  Patient Location: Home  Provider Location: Home Office  I discussed the limitations of evaluation and management by telemedicine. The patient expressed understanding and agreed to proceed.  Vital Signs: Because this visit was a virtual/telehealth visit, some criteria may be missing or patient reported. Any vitals not documented were not able to be obtained and vitals that have been documented are patient reported.  VideoDeclined- This patient declined Librarian, academic. Therefore the visit was completed with audio only.  Persons Participating in Visit: Patient.  AWV Questionnaire: No: Patient Medicare AWV questionnaire was not completed prior to this visit.  Cardiac Risk Factors include: advanced age (>39men, >39 women);male gender;diabetes mellitus;dyslipidemia;obesity (BMI >30kg/m2)     Objective:    Today's Vitals   03/02/24 1002 03/02/24 1003  Weight: 196 lb (88.9 kg)   Height: 5' 2.21 (1.58 m)   PainSc:  8    Body mass index is 35.61 kg/m.     03/02/2024   10:29 AM  Advanced Directives  Does Patient Have a Medical Advance Directive? No  Would patient like information on creating a medical advance directive? Yes (MAU/Ambulatory/Procedural Areas - Information given)    Current Medications (verified) Outpatient Encounter Medications as of 03/02/2024  Medication Sig   gabapentin  (NEURONTIN ) 600 MG tablet Take 600 mg by mouth  daily. TAKE 0.5 TABLETS BY MOUTH AT BEDTIME.   hydrocortisone  (ANUSOL -HC) 2.5 % rectal cream Place 1 Application rectally 2 (two) times daily.   lisinopril  (ZESTRIL ) 5 MG tablet Take 1 tablet (5 mg total) by mouth daily. (Patient taking differently: Take 5 mg by mouth daily. Doesn't take every day. Takes 2-3 times per week.)   meloxicam  (MOBIC ) 15 MG tablet Take 1 tablet (15 mg total) by mouth daily. (Patient taking differently: Take 15 mg by mouth daily. PRN)   metFORMIN  (GLUCOPHAGE ) 500 MG tablet Take 1 tablet (500 mg total) by mouth 2 (two) times daily with a meal.   omeprazole  (PRILOSEC) 40 MG capsule Take 1 capsule (40 mg total) by mouth daily.   rosuvastatin  (CRESTOR ) 40 MG tablet Take 1 tablet (40 mg total) by mouth daily.   tiZANidine  (ZANAFLEX ) 4 MG capsule Take 4 mg by mouth 3 (three) times daily as needed for muscle spasms.   No facility-administered encounter medications on file as of 03/02/2024.    Allergies (verified) Patient has no known allergies.   History: Past Medical History:  Diagnosis Date   Encounter for long-term (current) use of other high-risk medications    GERD (gastroesophageal reflux disease)    Hyperlipidemia    Lumbago    Past Surgical History:  Procedure Laterality Date   APPENDECTOMY     Family History  Problem Relation Age of Onset   Hypertension Father    Cancer Sister        breast   Social History   Socioeconomic History   Marital status: Married    Spouse name: Hadassah   Number of children: 5   Years  of education: Not on file   Highest education level: Not on file  Occupational History   Not on file  Tobacco Use   Smoking status: Former    Current packs/day: 0.00    Types: Cigarettes    Quit date: 05/30/1985    Years since quitting: 38.7   Smokeless tobacco: Never  Vaping Use   Vaping status: Never Used  Substance and Sexual Activity   Alcohol use: No    Alcohol/week: 0.0 standard drinks of alcohol   Drug use: No   Sexual  activity: Yes  Other Topics Concern   Not on file  Social History Narrative   Not on file   Social Drivers of Health   Financial Resource Strain: Low Risk  (03/02/2024)   Overall Financial Resource Strain (CARDIA)    Difficulty of Paying Living Expenses: Not hard at all  Food Insecurity: No Food Insecurity (03/02/2024)   Hunger Vital Sign    Worried About Running Out of Food in the Last Year: Never true    Ran Out of Food in the Last Year: Never true  Transportation Needs: No Transportation Needs (03/02/2024)   PRAPARE - Administrator, Civil Service (Medical): No    Lack of Transportation (Non-Medical): No  Physical Activity: Sufficiently Active (03/02/2024)   Exercise Vital Sign    Days of Exercise per Week: 3 days    Minutes of Exercise per Session: 60 min  Stress: No Stress Concern Present (03/02/2024)   [employer] of Occupational Health - Occupational Stress Questionnaire    Feeling of Stress: Not at all  Social Connections: Moderately Integrated (03/02/2024)   Social Connection and Isolation Panel    Frequency of Communication with Friends and Family: More than three times a week    Frequency of Social Gatherings with Friends and Family: More than three times a week    Attends Religious Services: More than 4 times per year    Active Member of [employer] or Organizations: No    Attends Engineer, structural: Never    Marital Status: Married    Tobacco Counseling Counseling given: Not Answered    Clinical Intake:  Pre-visit preparation completed: Yes  Pain : 0-10 Pain Score: 8  Pain Type: Chronic pain Pain Location: Shoulder Pain Orientation: Right Pain Descriptors / Indicators: Aching     BMI - recorded: 35.61 Nutritional Status: BMI > 30  Obese Nutritional Risks: None Diabetes: Yes CBG done?: No Did pt. bring in CBG monitor from home?: No  Lab Results  Component Value Date   HGBA1C 7.4 (H) 01/27/2024   HGBA1C 7.1 (H) 10/11/2023    HGBA1C 6.3 (H) 05/31/2023     How often do you need to have someone help you when you read instructions, pamphlets, or other written materials from your doctor or pharmacy?: 1 - Never  Interpreter Needed?: Yes Interpreter Agency: Landscape architect Interpreter Name: Adrianna Interpreter ID: [id number] Patient Declined Interpreter : No Patient signed [place] waiver: No  Information entered by :: Vina Ned, CMA   Activities of Daily Living     03/02/2024   10:09 AM 03/29/2023    9:24 AM  In your present state of health, do you have any difficulty performing the following activities:  Hearing? 0 0  Vision? 0 0  Difficulty concentrating or making decisions? 0 0  Walking or climbing stairs? 0 0  Dressing or bathing? 0 0  Doing errands, shopping? 0 0  Preparing Food and eating ?  N   Using the Toilet? N   In the past six months, have you accidently leaked urine? N   Do you have problems with loss of bowel control? N   Managing your Medications? N   Managing your Finances? N   Housekeeping or managing your Housekeeping? N     Patient Care Team: [name], Jolene T, NP as PCP - General (Nurse Practitioner) Sharlot Agent, OD (Optometry)  I have updated your Care Teams any recent Medical Services you may have received from other providers in the past year.     Assessment:   This is a routine wellness examination for [place].  Hearing/Vision screen Hearing Screening - Comments:: Denies hearing loss  Vision Screening - Comments:: Gets DM eye exams, Dr. Agent Sharlot, [facility] [place]   Goals Addressed             This Visit's Progress    Patient Stated       Get treatment for shoulder and have DM and cholesterol under control.       Depression Screen     03/02/2024   10:25 AM 03/29/2023    9:41 AM 03/29/2023    9:40 AM 03/29/2023    8:55 AM 09/25/2022   10:01 AM 02/17/2022   11:29 AM 02/02/2022    1:57 PM  PHQ 2/9 Scores  PHQ - 2 Score 0 0 0   0 0  PHQ- 9 Score  0 0    0 0  Exception Documentation    Patient refusal Patient refusal      Fall Risk     03/02/2024   10:35 AM 03/29/2023    8:54 AM 11/12/2022    9:13 AM 02/17/2022   11:23 AM 02/02/2022    1:56 PM  Fall Risk   Falls in the past year? 0 0 0 0 0  Number falls in past yr: 0 0 0 0 0  Injury with Fall? 0 0 0 0 0  Risk for fall due to : No Fall Risks No Fall Risks No Fall Risks  No Fall Risks  Follow up Falls evaluation completed Falls evaluation completed Falls evaluation completed Falls evaluation completed;Education provided;Falls prevention discussed  Falls evaluation completed      Data saved with a previous flowsheet row definition    MEDICARE RISK AT HOME:  Medicare Risk at Home Any stairs in or around the home?: Yes If so, are there any without handrails?: Yes Home free of loose throw rugs in walkways, pet beds, electrical cords, etc?: Yes Adequate lighting in your home to reduce risk of falls?: Yes Life alert?: No Use of a cane, walker or w/c?: No Grab bars in the bathroom?: No Shower chair or bench in shower?: No Elevated toilet seat or a handicapped toilet?: No  TIMED UP AND GO:  Was the test performed?  No  Cognitive Function: 6CIT completed        03/02/2024   10:40 AM 03/29/2023    9:41 AM 02/17/2022   11:25 AM  6CIT Screen  What Year? 4 points 0 points 0 points  What month? 0 points 0 points 0 points  What time? 0 points 0 points 0 points  Count back from 20 0 points 0 points 0 points  Months in reverse 4 points 0 points 4 points  Repeat phrase 4 points 2 points 4 points  Total Score 12 points 2 points 8 points    Immunizations Immunization History  Administered Date(s) Administered  Fluad Quad(high Dose 65+) 03/29/2019, 02/19/2020, 04/25/2021, 03/30/2022   Fluad Trivalent(High Dose 65+) 03/29/2023   INFLUENZA, HIGH DOSE SEASONAL PF 03/12/2017   Moderna Sars-Covid-2 Vaccination 07/31/2019, 08/29/2019   Pneumococcal Conjugate-13 03/12/2017    Pneumococcal Polysaccharide-23 03/29/2019   Respiratory Syncytial Virus Vaccine,Recomb Aduvanted(Arexvy) 02/06/2022   Td 12/25/2022   Tdap 10/23/2011, 01/18/2015   Zoster Recombinant(Shingrix ) 02/06/2022, 04/09/2022    Screening Tests Health Maintenance  Topic Date Due   OPHTHALMOLOGY EXAM  12/23/2022   Influenza Vaccine  01/07/2024   COVID-19 Vaccine (3 - 2025-26 season) 02/07/2024   HEMOGLOBIN A1C  07/29/2024   Diabetic kidney evaluation - Urine ACR  10/10/2024   FOOT EXAM  11/15/2024   Diabetic kidney evaluation - eGFR measurement  01/26/2025   Medicare Annual Wellness (AWV)  03/02/2025   Colonoscopy  06/09/2027   DTaP/Tdap/Td (4 - Td or Tdap) 12/24/2032   Pneumococcal Vaccine: 50+ Years  Completed   Hepatitis C Screening  Completed   Zoster Vaccines- Shingrix   Completed   HPV VACCINES  Aged Out   Meningococcal B Vaccine  Aged Out    Health Maintenance Items Addressed: See Nurse Notes at the end of this note  Additional Screening:  Vision Screening: Recommended annual ophthalmology exams for early detection of glaucoma and other disorders of the eye. Is the patient up to date with their annual eye exam?  Yes requested results of last exam Who is the provider or what is the name of the office in which the patient attends annual eye exams? Dr. Lynwood Hasting, [facility] [place]  Dental Screening: Recommended annual dental exams for proper oral hygiene  Community Resource Referral / Chronic Care Management: CRR required this visit?  No   CCM required this visit?  No   Plan:    I have personally reviewed and noted the following in the patient's chart:   Medical and social history Use of alcohol, tobacco or illicit drugs  Current medications and supplements including opioid prescriptions. Patient is not currently taking opioid prescriptions. Functional ability and status Nutritional status Physical activity Advanced directives List of other  physicians Hospitalizations, surgeries, and ER visits in previous 12 months Vitals Screenings to include cognitive, depression, and falls Referrals and appointments  In addition, I have reviewed and discussed with patient certain preventive protocols, quality metrics, and best practice recommendations. A written personalized care plan for preventive services as well as general preventive health recommendations were provided to patient.   Vina Ned, CMA   03/02/2024   After Visit Summary: (Mail) Due to this being a telephonic visit, the after visit summary with patients personalized plan was offered to patient via mail   Notes:  6 CIT Score - 12 Requested most recent DM eye exam Plans to get flu vaccine  Declined Covid vaccine May be interested in referral to DM & Nutrition education when he gets back from [place] (May 2026)

## 2024-03-07 ENCOUNTER — Ambulatory Visit: Admitting: Family Medicine

## 2024-03-07 ENCOUNTER — Encounter: Payer: Self-pay | Admitting: Family Medicine

## 2024-03-07 VITALS — BP 110/60 | HR 74 | Ht 62.21 in | Wt 200.4 lb

## 2024-03-07 DIAGNOSIS — M75101 Unspecified rotator cuff tear or rupture of right shoulder, not specified as traumatic: Secondary | ICD-10-CM

## 2024-03-07 DIAGNOSIS — M4727 Other spondylosis with radiculopathy, lumbosacral region: Secondary | ICD-10-CM | POA: Insufficient documentation

## 2024-03-07 DIAGNOSIS — M75102 Unspecified rotator cuff tear or rupture of left shoulder, not specified as traumatic: Secondary | ICD-10-CM | POA: Insufficient documentation

## 2024-03-07 MED ORDER — CELECOXIB 100 MG PO CAPS
100.0000 mg | ORAL_CAPSULE | Freq: Two times a day (BID) | ORAL | 0 refills | Status: AC | PRN
Start: 2024-03-07 — End: ?

## 2024-03-07 NOTE — Assessment & Plan Note (Signed)
 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 PALPATION: Right upper trapezius, subacromial space, and bicipital groove nontender. Left upper trapezius nontender. STRENGTH: Right shoulder external rotation 4+/5 with pain; left external rotation 5-/5, painless. Internal rotation 5/5 bilaterally with discomfort at the left lateral deltoid. Isolated supraspinatus testing elicits maximal discomfort bilaterally; left 5/5 strength, right 4+/5 strength. SPECIAL TESTS: Right shoulder--Neer's negative to equivocal, Hawkins equivocal, Speed's positive, Yergason's negative. Left shoulder--Neer's negative, Hawkins equivocal to positive, Speed's positive, Yergason's negative.  Right supraspinatus tendon tear (complete rotator cuff tear) Complete tear causing significant weakness and pain, impairing shoulder movement. - Discussed surgical and non-surgical repair options considering work demands. - Consider cortisone injection in right shoulder before travel.  Left supraspinatus tendinopathy (incomplete rotator cuff tear) - Consider cortisone injection in left shoulder before travel.

## 2024-03-07 NOTE — Patient Instructions (Signed)
 YOUR PLAN:  RIGHT SUPRASPINATUS TENDON TEAR (COMPLETE ROTATOR CUFF TEAR): You have findings consistent with a complete tear in your right shoulder causing significant weakness and pain.  We discussed both surgical and non-surgical repair options.  We also discussed the option of a cortisone injection at a future visit if your pain persists.  LEFT SUPRASPINATUS TENDINOPATHY:  We discussed the option of a cortisone injection at a future visit if your pain persists.  CHRONIC LOW BACK PAIN WITH LEFT THIGH NUMBNESS: You have long-standing back pain with numbness in your left thigh, likely due to nerve impingement.  Stop taking meloxicam .  Start Celebrex at least once daily with food, and you may take a second dose later in the day if needed for pain.  Return in 2 weeks for follow-up.

## 2024-03-07 NOTE — Progress Notes (Signed)
 Primary Care / Sports Medicine Office Visit  Patient Information:  Patient ID: Dale Morton, male DOB: 03-10-1951 Age: 73 y.o. MRN: [id number]   Dale Morton is a pleasant 73 y.o. male presenting with the following:  Chief Complaint  Patient presents with   Back Pain    Interpreter Candia [phone]: Low back pain with numbness down left thigh x 1 year.    Shoulder Pain    Bil shoulder pain. Right shoulder difficulty lifting x 1 year with weakness.. Left shoulder hurts a little.     Vitals:   03/07/24 1330  BP: 110/60  Pulse: 74  SpO2: 96%   Vitals:   03/07/24 1330  Weight: 200 lb 6.4 oz (90.9 kg)  Height: 5' 2.21 (1.58 m)   Body mass index is 36.41 kg/m.  No results found.   Discussed the use of AI scribe software for clinical note transcription with the patient, who gave verbal consent to proceed.   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 PALPATION: Right upper trapezius, subacromial space, and bicipital groove nontender. Left upper trapezius nontender. STRENGTH: Right shoulder external rotation 4+/5 with pain; left external rotation 5-/5, painless. Internal rotation 5/5 bilaterally with  discomfort at the left lateral deltoid. Isolated supraspinatus testing elicits maximal discomfort bilaterally; left 5/5 strength, right 4+/5 strength. SPECIAL TESTS: Right shoulder--Neer's negative to equivocal, Hawkins equivocal, Speed's positive, Yergason's negative. Left shoulder--Neer's negative, Hawkins equivocal to positive, Speed's positive, Yergason's negative.  Right supraspinatus tendon tear (complete rotator cuff tear) Complete tear causing significant weakness and pain, impairing shoulder movement. - Discussed surgical and non-surgical repair options considering work demands. - Consider cortisone injection in right shoulder before travel.  Left supraspinatus tendinopathy (incomplete rotator cuff tear) - Consider cortisone injection in left shoulder before travel.      Relevant Medications   celecoxib (CELEBREX) 100 MG capsule   Osteoarthritis of spine with radiculopathy, lumbosacral region   Thigh numbness - Numbness in the thigh began approximately one year ago, concurrent with onset of shoulder pain - Numbness does not extend below the knee  Chronic back pain - Long-standing back pain since 1987 - Previous therapy in [place] without significant improvement - Uses meloxicam  as needed for pain, but not daily  PHYSICAL EXAM - LUMBOSACRAL INSPECTION: Normal alignment. No swelling, erythema, or deformity. PALPATION: Lumbar spine and paraspinal musculature nontender. Sacroiliac joints benign bilaterally. RANGE OF MOTION: Limited flexion, extension, lateral bending, and rotation with painful extension. STRENGTH: 5/5 strength in lower extremities bilaterally. NEUROVASCULAR: Sensation intact throughout lower extremities. Reflexes symmetric. Distal pulses 2+. SPECIAL TESTS: Straight leg raise negative bilaterally. Piriformis and FADIR tests negative bilaterally. Negative Kemp's test.  Chronic low back pain with left thigh numbness Chronic pain with left thigh numbness likely  due to nerve  impingement, partially relieved by gabapentin  and tizanidine . - Prescribe Celebrex up to twice daily as needed. - Discontinue current meloxicam , replace with Celebrex.      Relevant Medications   celecoxib (CELEBREX) 100 MG capsule   A total of 47 minutes was spent on the date of service, 03/07/2024, encompassing both face-to-face and non-face-to-face time. This included review of prior records and imaging (e.g., MRI and/or radiographs), medical chart review, information gathering, documentation, care coordination with clinic staff, discussion and counseling with the patient regarding clinical findings and treatment options, and planning for follow-up and next steps in management. Specific additional time spent coordinating care using translation services, ensuring patient is fully understanding of his multiple medical conditions, and a review of both surgical and non-surgical treatment options, Questions were answered at length.   Orders & Medications Medications:  Meds ordered this encounter  Medications   celecoxib (CELEBREX) 100 MG capsule    Sig: Take 1 capsule (100 mg total) by mouth 2 (two) times daily as needed.    Dispense:  60 capsule    Refill:  0   No orders of the defined types were placed in this encounter.    No follow-ups on file.     Selinda JINNY Ku, MD, [facility]   Primary Care Sports Medicine Primary Care and Sports Medicine at [facility]

## 2024-03-07 NOTE — Assessment & Plan Note (Signed)
 Thigh numbness - Numbness in the thigh began approximately one year ago, concurrent with onset of shoulder pain - Numbness does not extend below the knee  Chronic back pain - Long-standing back pain since 1987 - Previous therapy in [place] without significant improvement - Uses meloxicam  as needed for pain, but not daily  PHYSICAL EXAM - LUMBOSACRAL INSPECTION: Normal alignment. No swelling, erythema, or deformity. PALPATION: Lumbar spine and paraspinal musculature nontender. Sacroiliac joints benign bilaterally. RANGE OF MOTION: Limited flexion, extension, lateral bending, and rotation with painful extension. STRENGTH: 5/5 strength in lower extremities bilaterally. NEUROVASCULAR: Sensation intact throughout lower extremities. Reflexes symmetric. Distal pulses 2+. SPECIAL TESTS: Straight leg raise negative bilaterally. Piriformis and FADIR tests negative bilaterally. Negative Kemp's test.  Chronic low back pain with left thigh numbness Chronic pain with left thigh numbness likely due to nerve impingement, partially relieved by gabapentin  and tizanidine . - Prescribe Celebrex up to twice daily as needed. - Discontinue current meloxicam , replace with Celebrex.

## 2024-03-27 ENCOUNTER — Encounter: Payer: Self-pay | Admitting: Family Medicine

## 2024-03-27 ENCOUNTER — Ambulatory Visit (INDEPENDENT_AMBULATORY_CARE_PROVIDER_SITE_OTHER): Admitting: Family Medicine

## 2024-03-27 ENCOUNTER — Other Ambulatory Visit (INDEPENDENT_AMBULATORY_CARE_PROVIDER_SITE_OTHER): Payer: Self-pay | Admitting: Radiology

## 2024-03-27 VITALS — BP 120/70 | HR 64 | Temp 97.5°F | Ht 62.21 in | Wt 201.2 lb

## 2024-03-27 DIAGNOSIS — M7521 Bicipital tendinitis, right shoulder: Secondary | ICD-10-CM

## 2024-03-27 DIAGNOSIS — Z23 Encounter for immunization: Secondary | ICD-10-CM

## 2024-03-27 DIAGNOSIS — M75101 Unspecified rotator cuff tear or rupture of right shoulder, not specified as traumatic: Secondary | ICD-10-CM

## 2024-03-27 DIAGNOSIS — M75102 Unspecified rotator cuff tear or rupture of left shoulder, not specified as traumatic: Secondary | ICD-10-CM

## 2024-03-27 MED ORDER — TRIAMCINOLONE ACETONIDE 40 MG/ML IJ SUSP
120.0000 mg | Freq: Once | INTRAMUSCULAR | Status: AC
  Administered 2024-03-27: 120 mg via INTRA_ARTICULAR

## 2024-03-27 NOTE — Assessment & Plan Note (Signed)
 History of Present Illness Dale Morton is a 73 year old male who presents for bilateral shoulder injections.  Bilateral shoulder pain and functional limitation - Bilateral shoulder pain, right greater than left - Right shoulder pain radiates down the right arm to the elbow, described as an 'electric shock' sensation, especially with hand movement - Significant impact on ability to perform activities requiring reaching; unable to reach certain positions without assistance from the other hand - Duration of symptoms unspecified - No other associated symptoms  Pain management and therapeutic interventions - Uses TENS unit for pain management, which provides temporary relief while in use - Performs home exercises with a small device - Currently taking Celebrex for pain management  Treatment considerations - Planning to travel next Monday, which is a consideration in treatment timing  Physical Exam PALPATION: Right biceps tendon tenderness, positive speeds test, positive Yergason's Test.  Assessment and Plan Right rotator cuff tear (suspected) associated impingement syndrome and right biceps tendonitis Suspected partial or full right rotator cuff tear with adjunct biceps tendonitis causing functional limitations and anterior shoulder pain. - Administer cortisone injection to right shoulder subacromial space for rotator cuff and around biceps tendon at bicipital groove. - Provide exercises to improve shoulder function. - Advise icing the shoulder for 20 minutes, twice daily, especially after numbing medication wears off. - Continue Celebrex as needed for pain. - Reassess in three months if symptoms persist.  Left rotator cuff impingement syndrome Suspected rotator cuff involvement, likely less severe than the right, with pain present. - Administer cortisone injection to left shoulder subacromial space - Provide exercises to improve shoulder function. - Advise icing the shoulder for 20  minutes, twice daily, especially after numbing medication wears off. - Continue Celebrex as needed for pain. - Reassess in three months if symptoms persist.

## 2024-03-27 NOTE — Assessment & Plan Note (Signed)
 See additional assessment(s) for plan details.

## 2024-03-27 NOTE — Progress Notes (Signed)
 Primary Care / Sports Medicine Office Visit  Patient Information:  Patient ID: Dale Morton, male DOB: 21-Oct-1950 Age: 73 y.o. MRN: [id number]   Dale Morton is a pleasant 73 y.o. male presenting with the following:  Chief Complaint  Patient presents with   Shoulder Pain    Patient presents today for bil shoulder pain. He has been taking Celebrex daily and it has been helping but he is wanting to get cortisone injections today for both shoulder to see how they help.     Vitals:   03/27/24 0901  BP: 120/70  Pulse: 64  Temp: (!) 97.5 F (36.4 C)  SpO2: 95%   Vitals:   03/27/24 0901  Weight: 201 lb 3.2 oz (91.3 kg)  Height: 5' 2.21 (1.58 m)   Body mass index is 36.55 kg/m.  No results found.   Discussed the use of AI scribe software for clinical note transcription with the patient, who gave verbal consent to proceed.   Independent interpretation of notes and tests performed by another provider:   None  Procedures performed:   Procedure:  Injection of right shoulder under ultrasound guidance. Ultrasound guidance utilized for subacromial injection, absence of supraspinatus tendon noted in the subacromial space raising concern for full-thickness tear Samsung HS60 device utilized with permanent recording / reporting. Verbal informed consent obtained and verified. Skin prepped in a sterile fashion. Ethyl chloride for topical local analgesia.  Completed without difficulty and tolerated well. Medication: triamcinolone acetonide 40 mg/mL suspension for injection 1 mL total and 2 mL lidocaine  1% without epinephrine utilized for needle placement anesthetic Advised to contact for fevers/chills, erythema, induration, drainage, or persistent bleeding.  Procedure:  Injection of right shoulder under ultrasound guidance. Ultrasound guidance utilized for out of plane anterior approach to right bicipital groove, biceps tendon noted with associated thickening consistent with  tendinitis Samsung HS60 device utilized with permanent recording / reporting. Verbal informed consent obtained and verified. Skin prepped in a sterile fashion. Ethyl chloride for topical local analgesia.  Completed without difficulty and tolerated well. Medication: triamcinolone acetonide 40 mg/mL suspension for injection 1 mL total and 2 mL lidocaine  1% without epinephrine utilized for needle placement anesthetic Advised to contact for fevers/chills, erythema, induration, drainage, or persistent bleeding.  Procedure:  Injection of left shoulder under ultrasound guidance. Ultrasound guidance utilized for subacromial approach, no significant sonographic evidence of tendinopathy Samsung HS60 device utilized with permanent recording / reporting. Verbal informed consent obtained and verified. Skin prepped in a sterile fashion. Ethyl chloride for topical local analgesia.  Completed without difficulty and tolerated well. Medication: triamcinolone acetonide 40 mg/mL suspension for injection 1 mL total and 2 mL lidocaine  1% without epinephrine utilized for needle placement anesthetic Advised to contact for fevers/chills, erythema, induration, drainage, or persistent bleeding.   Pertinent History, Exam, Impression, and Recommendations:   Problem List Items Addressed This Visit     Biceps tendinitis, right   See additional assessment(s) for plan details.      Relevant Orders   US  LIMITED JOINT SPACE STRUCTURES UP BILAT   Bilateral rotator cuff syndrome - Primary   History of Present Illness Dale Morton is a 73 year old male who presents for bilateral shoulder injections.  Bilateral shoulder pain and functional limitation - Bilateral shoulder pain, right greater than left - Right shoulder pain radiates down the right arm to the elbow, described as an 'electric shock' sensation, especially with hand movement - Significant impact on ability to perform activities  requiring reaching; unable  to reach certain positions without assistance from the other hand - Duration of symptoms unspecified - No other associated symptoms  Pain management and therapeutic interventions - Uses TENS unit for pain management, which provides temporary relief while in use - Performs home exercises with a small device - Currently taking Celebrex for pain management  Treatment considerations - Planning to travel next Monday, which is a consideration in treatment timing  Physical Exam PALPATION: Right biceps tendon tenderness, positive speeds test, positive Yergason's Test.  Assessment and Plan Right rotator cuff tear (suspected) associated impingement syndrome and right biceps tendonitis Suspected partial or full right rotator cuff tear with adjunct biceps tendonitis causing functional limitations and anterior shoulder pain. - Administer cortisone injection to right shoulder subacromial space for rotator cuff and around biceps tendon at bicipital groove. - Provide exercises to improve shoulder function. - Advise icing the shoulder for 20 minutes, twice daily, especially after numbing medication wears off. - Continue Celebrex as needed for pain. - Reassess in three months if symptoms persist.  Left rotator cuff impingement syndrome Suspected rotator cuff involvement, likely less severe than the right, with pain present. - Administer cortisone injection to left shoulder subacromial space - Provide exercises to improve shoulder function. - Advise icing the shoulder for 20 minutes, twice daily, especially after numbing medication wears off. - Continue Celebrex as needed for pain. - Reassess in three months if symptoms persist.      Relevant Orders   US  LIMITED JOINT SPACE STRUCTURES UP BILAT   Other Visit Diagnoses       Encounter for immunization       Relevant Orders   Flu vaccine HIGH DOSE PF(Fluzone Trivalent) (Completed)        Orders & Medications Medications:  Meds ordered this  encounter  Medications   triamcinolone acetonide (KENALOG-40) injection 120 mg   Orders Placed This Encounter  Procedures   US  LIMITED JOINT SPACE STRUCTURES UP BILAT   Flu vaccine HIGH DOSE PF(Fluzone Trivalent)     No follow-ups on file.     Selinda JINNY Ku, MD, [facility]   Primary Care Sports Medicine Primary Care and Sports Medicine at [facility]

## 2024-03-27 NOTE — Patient Instructions (Signed)
 VISIT SUMMARY:  Today, you received cortisone injections in both shoulders to help manage your shoulder pain and improve your function. We discussed exercises, pain management, and follow-up care.  YOUR PLAN:  RIGHT ROTATOR CUFF TEAR (SUSPECTED) AND RIGHT BICEPS TENDONITIS: You have a suspected partial or full tear in your right rotator cuff and associated biceps tendonitis, which is causing pain and limiting your shoulder function. -You received a cortisone injection in your right shoulder to help with the pain and inflammation. -Continue taking Celebrex as needed for pain. -Ice your right shoulder for 20 minutes, twice daily, especially after the numbing medication wears off. -Perform the exercises provided to improve your shoulder function. -We will reassess your condition in three months if your symptoms persist.  LEFT ROTATOR CUFF IMPINGEMENT (SUSPECTED): You have a suspected tear in your left rotator cuff, which is causing pain. -You received a cortisone injection in your left shoulder to help with the pain and inflammation. -Continue taking Celebrex as needed for pain. -Ice your left shoulder for 20 minutes, twice daily, especially after the numbing medication wears off. -Perform the exercises provided to improve your shoulder function. -We will reassess your condition in three months if your symptoms persist.

## 2024-04-12 ENCOUNTER — Ambulatory Visit: Admitting: Nurse Practitioner

## 2025-03-06 ENCOUNTER — Ambulatory Visit
